# Patient Record
Sex: Male | Born: 1940 | Race: White | Hispanic: No | Marital: Married | Smoking: Former smoker
Health system: Southern US, Community
[De-identification: ages and names within clinical notes are randomized; demographics above are authoritative.]

## PROBLEM LIST (undated history)

## (undated) DIAGNOSIS — H908 Mixed conductive and sensorineural hearing loss, unspecified: Secondary | ICD-10-CM

## (undated) DIAGNOSIS — Z961 Presence of intraocular lens: Secondary | ICD-10-CM

## (undated) DIAGNOSIS — Z7901 Long term (current) use of anticoagulants: Secondary | ICD-10-CM

## (undated) DIAGNOSIS — I495 Sick sinus syndrome: Secondary | ICD-10-CM

## (undated) DIAGNOSIS — I2510 Atherosclerotic heart disease of native coronary artery without angina pectoris: Secondary | ICD-10-CM

## (undated) DIAGNOSIS — I639 Cerebral infarction, unspecified: Secondary | ICD-10-CM

## (undated) DIAGNOSIS — E119 Type 2 diabetes mellitus without complications: Secondary | ICD-10-CM

## (undated) DIAGNOSIS — C189 Malignant neoplasm of colon, unspecified: Secondary | ICD-10-CM

## (undated) DIAGNOSIS — Z950 Presence of cardiac pacemaker: Secondary | ICD-10-CM

## (undated) DIAGNOSIS — M1990 Unspecified osteoarthritis, unspecified site: Secondary | ICD-10-CM

## (undated) DIAGNOSIS — I509 Heart failure, unspecified: Secondary | ICD-10-CM

## (undated) DIAGNOSIS — I219 Acute myocardial infarction, unspecified: Secondary | ICD-10-CM

## (undated) DIAGNOSIS — I10 Essential (primary) hypertension: Secondary | ICD-10-CM

## (undated) DIAGNOSIS — N189 Chronic kidney disease, unspecified: Secondary | ICD-10-CM

## (undated) DIAGNOSIS — J449 Chronic obstructive pulmonary disease, unspecified: Secondary | ICD-10-CM

## (undated) DIAGNOSIS — E039 Hypothyroidism, unspecified: Secondary | ICD-10-CM

## (undated) DIAGNOSIS — I499 Cardiac arrhythmia, unspecified: Secondary | ICD-10-CM

## (undated) DIAGNOSIS — K219 Gastro-esophageal reflux disease without esophagitis: Secondary | ICD-10-CM

## (undated) HISTORY — DX: Acute myocardial infarction, unspecified: I21.9

## (undated) HISTORY — PX: CORONARY ANGIOPLASTY: SHX604

## (undated) HISTORY — DX: Type 2 diabetes mellitus without complications: E11.9

## (undated) HISTORY — DX: Chronic kidney disease, unspecified: N18.9

## (undated) HISTORY — DX: Chronic obstructive pulmonary disease, unspecified: J44.9

## (undated) HISTORY — PX: OTHER SURGICAL HISTORY: SHX169

## (undated) HISTORY — DX: Gastro-esophageal reflux disease without esophagitis: K21.9

## (undated) HISTORY — DX: Cerebral infarction, unspecified: I63.9

## (undated) HISTORY — PX: COLON SURGERY: SHX602

---

## 2001-09-27 DIAGNOSIS — C189 Malignant neoplasm of colon, unspecified: Secondary | ICD-10-CM

## 2001-09-27 HISTORY — DX: Malignant neoplasm of colon, unspecified: C18.9

## 2013-12-04 DIAGNOSIS — I4891 Unspecified atrial fibrillation: Secondary | ICD-10-CM | POA: Insufficient documentation

## 2014-01-15 DIAGNOSIS — G629 Polyneuropathy, unspecified: Secondary | ICD-10-CM | POA: Insufficient documentation

## 2014-01-15 DIAGNOSIS — I10 Essential (primary) hypertension: Secondary | ICD-10-CM | POA: Insufficient documentation

## 2014-01-15 DIAGNOSIS — I2510 Atherosclerotic heart disease of native coronary artery without angina pectoris: Secondary | ICD-10-CM | POA: Insufficient documentation

## 2014-01-15 DIAGNOSIS — G589 Mononeuropathy, unspecified: Secondary | ICD-10-CM | POA: Insufficient documentation

## 2014-03-07 DIAGNOSIS — H908 Mixed conductive and sensorineural hearing loss, unspecified: Secondary | ICD-10-CM | POA: Insufficient documentation

## 2014-03-07 DIAGNOSIS — H663X9 Other chronic suppurative otitis media, unspecified ear: Secondary | ICD-10-CM | POA: Insufficient documentation

## 2014-04-25 DIAGNOSIS — R001 Bradycardia, unspecified: Secondary | ICD-10-CM | POA: Insufficient documentation

## 2014-04-26 DIAGNOSIS — Z950 Presence of cardiac pacemaker: Secondary | ICD-10-CM | POA: Insufficient documentation

## 2014-04-26 DIAGNOSIS — I495 Sick sinus syndrome: Secondary | ICD-10-CM

## 2014-04-26 HISTORY — DX: Sick sinus syndrome: I49.5

## 2014-06-17 ENCOUNTER — Ambulatory Visit: Payer: Self-pay | Admitting: Ophthalmology

## 2014-07-08 ENCOUNTER — Ambulatory Visit: Payer: Self-pay | Admitting: Ophthalmology

## 2014-10-08 ENCOUNTER — Ambulatory Visit: Payer: Self-pay | Admitting: Family Medicine

## 2014-10-09 DIAGNOSIS — N3289 Other specified disorders of bladder: Secondary | ICD-10-CM | POA: Insufficient documentation

## 2014-10-09 DIAGNOSIS — N179 Acute kidney failure, unspecified: Secondary | ICD-10-CM | POA: Insufficient documentation

## 2014-11-20 DIAGNOSIS — N200 Calculus of kidney: Secondary | ICD-10-CM | POA: Insufficient documentation

## 2014-12-09 DIAGNOSIS — Z7901 Long term (current) use of anticoagulants: Secondary | ICD-10-CM | POA: Insufficient documentation

## 2015-02-10 DIAGNOSIS — I513 Intracardiac thrombosis, not elsewhere classified: Secondary | ICD-10-CM | POA: Insufficient documentation

## 2015-03-13 DIAGNOSIS — J449 Chronic obstructive pulmonary disease, unspecified: Secondary | ICD-10-CM | POA: Insufficient documentation

## 2015-07-23 DIAGNOSIS — I639 Cerebral infarction, unspecified: Secondary | ICD-10-CM | POA: Insufficient documentation

## 2015-09-02 ENCOUNTER — Ambulatory Visit: Payer: Medicare Other | Attending: Rehabilitation | Admitting: Occupational Therapy

## 2015-09-02 ENCOUNTER — Encounter: Payer: Self-pay | Admitting: Occupational Therapy

## 2015-09-02 DIAGNOSIS — M6281 Muscle weakness (generalized): Secondary | ICD-10-CM

## 2015-09-02 DIAGNOSIS — R2681 Unsteadiness on feet: Secondary | ICD-10-CM | POA: Diagnosis present

## 2015-09-02 DIAGNOSIS — R262 Difficulty in walking, not elsewhere classified: Secondary | ICD-10-CM | POA: Insufficient documentation

## 2015-09-02 DIAGNOSIS — R531 Weakness: Secondary | ICD-10-CM | POA: Diagnosis present

## 2015-09-02 NOTE — Therapy (Signed)
[place] MAIN [facility] SERVICES [address] [place], [place], [address] Phone: [phone]   Fax:  [phone]  Occupational Therapy Evaluation  Patient Details  Name: Michael Bush MRN: [id number] Date of Birth: 05/22/1941 No Data Recorded  Encounter Date: 09/02/2015      OT End of Session - 09/18/15 1449    Visit Number 1   Number of Visits 24   Date for OT Re-Evaluation 11/25/15   Equipment Utilized During Treatment stroke test kit   Behavior During Therapy [facility] for tasks assessed/performed      Past Medical History  Diagnosis Date  . COPD (chronic obstructive pulmonary disease) ([place])   . Diabetes mellitus without complication ([place])   . GERD (gastroesophageal reflux disease)   . Chronic kidney disease   . Myocardial infarction ([place])   . Stroke ([place])   . Cancer [facility])     Past Surgical History  Procedure Laterality Date  . Colon surgery      There were no vitals filed for this visit.  Visit Diagnosis:  Muscle weakness of left arm - Plan: Ot plan of care cert/re-cert      Subjective Assessment - 09/18/15 1327    Subjective  Patient noticing some new movements in his arm.   Patient is accompained by: Family member    R UE shoulder flexion 0-150o distal motions WNL Strength is 5/5 through out. Grip 115 lbs. L UE shoulder abduction 0-65o (active) elbow 2-/5  In extension and flexion with flexion to -25o. Forearm supination is 1/5 pronation is 2-/5. Minimal hand flexion and no extension. Sensation for light touch, sharp and temp are intact bilaterally.  Patient needs assist for dressing, bathing, cutting food and fasteners including tying shoes. His left upper extremity is very limited.        [facility] OT Assessment - 09/18/15 0001    Precautions   Precautions [place] expects to be discharged to: Private residence   Living Arrangements Spouse/significant other   Available Help at  Discharge Family   Type of [place] One level   [place] - manual   Prior Function   Level of [place] with basic ADLs   Vocation Retired   ADL   Eating/Feeding Modified independent   Eating/Feeding Patient Percentage --  assist to cut food   Grooming Minimal assistance   Upper Body Bathing Minimal assistance   Lower Body Bathing Moderate assistance   Upper Body Dressing Moderate assistance   Lower Body Dressing Moderate assistance   Toilet Tranfer Minimal assistance   Toileting -  Hygiene Moderate assistance   Mobility   Mobility Status Needs assist   Mobility Status Comments quad cane   Written Expression   Dominant Hand Right                         OT Education - 09/18/15 1448    Education provided Yes   Education Details subluxation   Person(s) Educated Patient   Methods Explanation   Comprehension Verbalized understanding             OT Long Term Goals - 09/18/15 1501    OT [place] #1   Title Patient will be able to dress himself with help with fasteners.   Baseline Needs moderate assist and dependent for buttons   Time 12  Period Weeks   Status New   OT LONG TERM GOAL #2   Title Will be able to complete bathing with supervision    Baseline needs assist to bathe and dry   Time 12   Period Weeks   Status New   OT LONG TERM GOAL #3   Title Patient will be able to cut food   Baseline unable   Time 12   Period Weeks   Status New   OT LONG TERM GOAL #4   Title Will be able to tie shoe (one handed)   Baseline 12   Time 12   Period Weeks   Status New   OT LONG TERM GOAL #5   Title Will increase L UE strength to at least 3+/5 to aid in ADL goals.   Baseline Minimal use of L UE   Time 12   Period Weeks   Status New               Plan - 09/18/15 1501    Clinical Impression Statement This patient is a 74 year old male who came to  [facility] after suffering a stroke October 2016.  He lives in a one story home with his wife. He is retired. He had been independent with ADL and functional mobility without assistive devices. He now requires assist for his BADL and Deficits include dressing, bathing, hygiene and UE deficits.  He would benefit from OT for ADL, functional mobility training and upper extremity restoration including strength and coordination.   Pt will benefit from skilled therapeutic intervention in order to improve on the following deficits (Retired) Decreased activity tolerance;Decreased balance;Decreased coordination;Decreased endurance;Decreased knowledge of use of DME;Decreased mobility;Decreased range of motion;Decreased strength;Difficulty walking;Impaired tone;Impaired UE functional use   Rehab Potential Good   Clinical Impairments Affecting Rehab Potential severe L UE deficits.   OT Frequency 2x / week   OT Duration 12 weeks   OT Treatment/Interventions Self-care/ADL training;Electrical Stimulation;Neuromuscular education;Therapeutic exercise;DME and/or AE instruction;Manual Therapy;Therapeutic activities;Therapeutic exercises;Passive range of motion   Consulted and Agree with Plan of Care Patient;Family member/caregiver   Family Member Consulted wife        Problem List There are no active problems to display for this patient.  Sharon Mt, MS/OTR/L Sharon Mt 09/18/2015, 3:03 PM  [place] MAIN [facility] SERVICES [address] [place], [place], [address] Phone: [phone]   Fax:  [phone]  Name: Michael Bush MRN: [id number] Date of Birth: 11-14-1940

## 2015-09-02 NOTE — Patient Instructions (Signed)
Instructed regarding wearing sling.

## 2015-09-04 ENCOUNTER — Ambulatory Visit: Payer: Medicare Other | Admitting: Occupational Therapy

## 2015-09-04 ENCOUNTER — Encounter: Payer: Self-pay | Admitting: Occupational Therapy

## 2015-09-04 DIAGNOSIS — M6281 Muscle weakness (generalized): Secondary | ICD-10-CM | POA: Diagnosis not present

## 2015-09-04 NOTE — Therapy (Signed)
[place] MAIN [facility] SERVICES [address] [place], [place], [address] Phone: [phone]   Fax:  [phone]  Occupational Therapy Treatment  Patient Details  Name: Michael Bush MRN: [id number] Date of Birth: June 20, 1941 No Data Recorded  Encounter Date: 09/04/2015      OT End of Session - 09/04/15 1449    Visit Number 2   Number of Visits 24   Date for OT Re-Evaluation 11/25/15   OT Start Time 1345   OT Stop Time 1430   OT Time Calculation (min) 45 min   Behavior During Therapy [facility] for tasks assessed/performed      Past Medical History  Diagnosis Date  . COPD (chronic obstructive pulmonary disease) ([place])   . Diabetes mellitus without complication ([place])   . GERD (gastroesophageal reflux disease)   . Chronic kidney disease   . Myocardial infarction ([place])   . Stroke ([place])   . Cancer [facility])     Past Surgical History  Procedure Laterality Date  . Colon surgery      There were no vitals filed for this visit.  Visit Diagnosis:  Muscle weakness of left arm      Subjective Assessment - 09/04/15 1445    Subjective  I have not been able to move my arm.   Patient is accompained by: Family member    In supine stretch L upper extremity shoulder flexion, external and internal rotation elbow flexion and extension, forearm supination and pronation, wrist flexion and extension, and hand flexion and extension. Pain at end range of shoulder flexion and external rotation. Active assistive for shoulder flexion external and internal rotation, eccentric and concentric elbow extension, concentric elbow flexion, forearm supination wrist flexion and extension (wrist extension used tapping) in sitting resisted external rotation to reduce sublux. Cues for proper positioning and technique. Taught one handed shoe tie, buttoner, zipper hook.                            OT Education - 09/04/15 1448    Education provided Yes   Education Details Purpose of exercises              OT Long Term Goals - 09/02/15 1344    OT LONG TERM GOAL #1   Title Patient will be able to dress himself with help with fasteners.   Baseline Needs moderate assist and dependent for buttons   Time 12   Period Weeks   Status New   OT LONG TERM GOAL #2   Title Will be able to complete bathing with supervision    Baseline needs assist to bathe and dry   Time 12   Period Weeks   Status New   OT LONG TERM GOAL #3   Title Patient will be able to cut food   Baseline unable   Time 12   Period Weeks   Status New   OT LONG TERM GOAL #4   Title Will be able to tie shoe (one handed)   Baseline 12   Time 12   Period Weeks   Status New   OT LONG TERM GOAL #5   Title Will increase L UE strength to at least 3+/5 to aid in ADL goals.   Baseline Minimal use of L UE   Time 12   Period Weeks   Status New               Plan -  09/04/15 1450    Clinical Impression Statement External rotation reduces the sublux   Pt will benefit from skilled therapeutic intervention in order to improve on the following deficits (Retired) Decreased activity tolerance;Decreased balance;Decreased coordination;Decreased endurance;Decreased knowledge of use of DME;Decreased mobility;Decreased range of motion;Decreased strength;Difficulty walking;Impaired tone;Impaired UE functional use   OT Treatment/Interventions Self-care/ADL training;Electrical Stimulation;Neuromuscular education;Therapeutic exercise;DME and/or AE instruction;Manual Therapy;Therapeutic activities;Therapeutic exercises;Passive range of motion        Problem List There are no active problems to display for this patient.  Sharon Mt, MS/OTR/L  Sharon Mt 09/04/2015, 2:52 PM  [place] MAIN [facility] SERVICES [address] [place], [place], [address] Phone: [phone]   Fax:  [phone]  Name: Danarius Merryweather MRN: [id number] Date  of Birth: 01/25/1941

## 2015-09-04 NOTE — Patient Instructions (Signed)
Instructed patient in home exercise program for ROM and sublux

## 2015-09-09 ENCOUNTER — Encounter: Payer: Self-pay | Admitting: Physical Therapy

## 2015-09-09 ENCOUNTER — Ambulatory Visit: Payer: Medicare Other | Admitting: Occupational Therapy

## 2015-09-09 ENCOUNTER — Ambulatory Visit: Payer: Medicare Other | Admitting: Physical Therapy

## 2015-09-09 DIAGNOSIS — M6281 Muscle weakness (generalized): Secondary | ICD-10-CM | POA: Diagnosis not present

## 2015-09-09 DIAGNOSIS — R262 Difficulty in walking, not elsewhere classified: Secondary | ICD-10-CM

## 2015-09-09 DIAGNOSIS — R531 Weakness: Secondary | ICD-10-CM

## 2015-09-09 DIAGNOSIS — R2681 Unsteadiness on feet: Secondary | ICD-10-CM

## 2015-09-09 NOTE — Therapy (Signed)
[place] MAIN [facility] SERVICES [address] [place], [place], [address] Phone: [phone]   Fax:  [phone]  Occupational Therapy Treatment  Patient Details  Name: Michael Bush MRN: [id number] Date of Birth: 04/29/1941 No Data Recorded  Encounter Date: 09/09/2015  Late Entry      OT End of Session - 09/18/15 1327    Visit Number 3   Number of Visits 24   Date for OT Re-Evaluation 11/25/15   Behavior During Therapy [facility] for tasks assessed/performed      Past Medical History  Diagnosis Date  . COPD (chronic obstructive pulmonary disease) ([place])   . Diabetes mellitus without complication ([place])   . GERD (gastroesophageal reflux disease)   . Chronic kidney disease   . Myocardial infarction ([place])   . Stroke ([place])   . Cancer [facility])     Past Surgical History  Procedure Laterality Date  . Colon surgery      There were no vitals filed for this visit.  Visit Diagnosis:  Muscle weakness of left arm      Subjective Assessment - 09/18/15 1327    Subjective  Patient noticing some new movements in his arm.   Patient is accompained by: Family member     Supine, stretch R upper extremity in shoulder flexion, external and internal rotation elbow flexion and extension, forearm supination and pronation, wrist flexion and extension, and hand flexion and extension. L upper extremity active assistive exercises shoulder flexion, external and internal rotation elbow flexion and extension, forearm supination and pronation, wrist  Extension used quick stretch and tapping for supination and wrist extension. Shoulder retraction, isometric resistive external rotation to inhibit sublux.  Weight bearing and Patient stretched into reflex inhibiting pattern through out session to aid in normalizing tone. Eccentric and concentric elbow extension.                          OT Education - 09/18/15 1332    Education provided Yes   Education Details Instructed in theory of why his arm does what it does.   Person(s) Educated Patient   Methods Explanation;Demonstration;Verbal cues   Comprehension Returned demonstration;Verbalized understanding             OT Long Term Goals - 09/02/15 1344    OT LONG TERM GOAL #1   Title Patient will be able to dress himself with help with fasteners.   Baseline Needs moderate assist and dependent for buttons   Time 12   Period Weeks   Status New   OT LONG TERM GOAL #2   Title Will be able to complete bathing with supervision    Baseline needs assist to bathe and dry   Time 12   Period Weeks   Status New   OT LONG TERM GOAL #3   Title Patient will be able to cut food   Baseline unable   Time 12   Period Weeks   Status New   OT LONG TERM GOAL #4   Title Will be able to tie shoe (one handed)   Baseline 12   Time 12   Period Weeks   Status New   OT LONG TERM GOAL #5   Title Will increase L UE strength to at least 3+/5 to aid in ADL goals.   Baseline Minimal use of L UE   Time 12   Period Weeks   Status New  Plan - 09/18/15 1327    Clinical Impression Statement Able to stimulate weak supination today, to neutral position  working twords being able to hole a cup or position hand for opening door knob. Patient required facilitation technique of tapping at forearm to demonstrate supination. Patient would continue to benefit from skilled OT to improve independence in necessary daily tasks.    Pt will benefit from skilled therapeutic intervention in order to improve on the following deficits (Retired) Decreased activity tolerance;Decreased balance;Decreased coordination;Decreased endurance;Decreased knowledge of use of DME;Decreased mobility;Decreased range of motion;Decreased strength;Difficulty walking;Impaired tone;Impaired UE functional use   Rehab Potential Good   OT Duration 12 weeks   OT Treatment/Interventions Self-care/ADL training;Electrical  Stimulation;Neuromuscular education;Therapeutic exercise;DME and/or AE instruction;Manual Therapy;Therapeutic activities;Therapeutic exercises;Passive range of motion   Consulted and Agree with Plan of Care Patient;Family member/caregiver        Problem List There are no active problems to display for this patient.  Sharon Mt, MS/OTR/L  Sharon Mt 09/18/2015, 1:34 PM  [place] MAIN [facility] SERVICES [address] [place], [place], [address] Phone: [phone]   Fax:  [phone]  Name: Michael Bush MRN: [id number] Date of Birth: Apr 27, 1941

## 2015-09-09 NOTE — Therapy (Signed)
[place] MAIN [facility] SERVICES [place], [place], [address] Phone: [phone]   Fax:  [phone]  Physical Therapy Evaluation  Patient Details  Name: Michael Bush MRN: [id number] Date of Birth: 1940/11/26 Referring Provider: Elmyra Ricks Date: 09/09/2015    Past Medical History  Diagnosis Date  . COPD (chronic obstructive pulmonary disease) ([place])   . Diabetes mellitus without complication ([place])   . GERD (gastroesophageal reflux disease)   . Chronic kidney disease   . Myocardial infarction ([place])   . Stroke ([place])   . Cancer [facility])     Past Surgical History  Procedure Laterality Date  . Colon surgery      There were no vitals filed for this visit.  Visit Diagnosis:  Difficulty walking  Unsteady gait  Weakness    PAIN: No reports of pain  POSTURE: WFL   PROM/AROM: Flaccid LUE   STRENGTH:  Graded on a 0-5 scale Muscle Group Left Right  Shoulder flex    Shoulder Abd    Shoulder Ext    Shoulder IR/ER    Elbow    Wrist/hand    Hip Flex 3+ 4  Hip Abd 3+ 4  Hip Add 3 4  Hip Ext 2 4  Hip IR/ER 3+ 4  Knee Flex 4 4+  Knee Ext 4 4+  Ankle DF -4 4  Ankle PF 3+ 4   SENSATION: WFL  Coordination: impaired LLE    FUNCTIONAL MOBILITY: slow supine to sit and sit to supine   BALANCE: unable to tandem stand, single leg stand or turn head with feet together   GAIT: Ambulates with quad cane 200 feet with left toe catching intermediately  OUTCOME MEASURES: TEST Outcome Interpretation  5 times sit<>stand 17.25sec >47 yo, >15 sec indicates increased risk for falls  10 meter walk test     . 54            m/s <1.0 m/s indicates increased risk for falls; limited community ambulator  Timed up and Go   26.52              sec <14 sec indicates increased risk for falls  6 minute walk test    395            Feet 1000 feet is community Water quality scientist  <36/56 (100% risk for  falls), 37-45 (80% risk for falls); 46-51 (>50% risk for falls); 52-55 (lower risk <25% of falls)  [address] Peg Test LFlorentina Addison PT Assessment - 09/09/15 0001    Assessment   Medical Diagnosis CVA   Referring Provider LIPSCOMB-HUDSON,   Onset Date/Surgical Date 07/16/15   Hand Dominance Right   Next MD Visit 10/12/15   Prior Therapy hospital, HHPT   Precautions   Precautions Fall   Balance Screen   Has the patient fallen in the past 6 months Yes   How many times? 2   Has the patient had a decrease in activity level because of a fear of falling?  Yes   Is the patient reluctant to leave their home because of a fear of falling?  No   Home Environment   Living Environment Private residence   Living Arrangements Spouse/significant other   Available Help at Discharge Family   Type of Home  House   Home Access Stairs to enter   Entrance Stairs-Number of Steps 1   Entrance Stairs-Rails Right   Home Layout One level   Home Equipment Wheelchair - manual;Cane - quad;Other (comment)  hemi walkier   Prior Function   Level of Independence Independent                           PT Education - 2015/09/23 1505    Education provided Yes   Education Details plan of care   Person(s) Educated Patient   Methods Explanation   Comprehension Verbalized understanding             PT Long Term Goals - 2015-09-23 1506    PT LONG TERM GOAL #1   Title Patient will reduce timed up and go to <11 seconds to reduce fall risk and demonstrate improved transfer/gait ability.   Time 12   Period Weeks   PT LONG TERM GOAL #2   Title Patient will increase six minute walk test distance to >1000 for progression to community ambulator and improve gait ability   Time 12   Period Weeks   PT LONG TERM GOAL #3   Title Patient will increase 10 meter walk test to >1.41m/s as to improve gait speed for better community ambulation and to reduce fall risk.   Time 12    Period Weeks   PT LONG TERM GOAL #4   Title Patient will tolerate 5 seconds of single leg stance without loss of balance to improve ability to get in and out of shower safely.   Time 12   Period Weeks               Plan - 09-23-2015 1556    Clinical Impression Statement Patient is 74 yr old male with recent CVA october 2016. He has had 2 falls and has unsteady gait, decreased dynamic and static standing balance and mobiity dificits.    Pt will benefit from skilled therapeutic intervention in order to improve on the following deficits Abnormal gait;Decreased balance;Decreased mobility;Difficulty walking;Impaired tone;Decreased activity tolerance;Decreased strength;Impaired UE functional use;Decreased safety awareness   Rehab Potential Good   PT Frequency 2x / week   PT Duration 12 weeks   PT Treatment/Interventions Neuromuscular re-education;Balance training;Therapeutic exercise;Therapeutic activities;Stair training;Gait training;Functional mobility training   PT Next Visit Plan Balance training and strengthening   PT Home Exercise Plan sit to stand, squats, heel raises   Consulted and Agree with Plan of Care Patient          G-Codes - Sep 23, 2015 1555    Mobility: Walking and Moving Around Current Status [phone]) At least 40 percent but less than 60 percent impaired, limited or restricted   Mobility: Walking and Moving Around Goal Status [id number]) At least 20 percent but less than 40 percent impaired, limited or restricted       Problem List There are no active problems to display for this patient.   Alanson Puls 09/23/15, 4:02 PM  [place] MAIN [facility] SERVICES [address] [place], [place], [address] Phone: [phone]   Fax:  [phone]  Name: Michael Bush MRN: [id number] Date of Birth: 04/10/1941

## 2015-09-09 NOTE — Patient Instructions (Signed)
Instructed in additional home program

## 2015-09-11 ENCOUNTER — Encounter: Payer: Self-pay | Admitting: Occupational Therapy

## 2015-09-11 ENCOUNTER — Ambulatory Visit: Payer: Medicare Other | Admitting: Occupational Therapy

## 2015-09-11 DIAGNOSIS — M6281 Muscle weakness (generalized): Secondary | ICD-10-CM | POA: Diagnosis not present

## 2015-09-11 NOTE — Therapy (Signed)
[place] MAIN [facility] SERVICES [address] [place], [place], [address] Phone: [phone]   Fax:  [phone]  Occupational Therapy Treatment  Patient Details  Name: Michael Bush MRN: [id number] Date of Birth: 05-07-1941 No Data Recorded  Encounter Date: 09/11/2015      OT End of Session - 09/11/15 1456    Visit Number 4   Number of Visits 24   Date for OT Re-Evaluation 11/25/15   OT Start Time 1345   OT Stop Time 1430   OT Time Calculation (min) 45 min      Past Medical History  Diagnosis Date  . COPD (chronic obstructive pulmonary disease) ([place])   . Diabetes mellitus without complication ([place])   . GERD (gastroesophageal reflux disease)   . Chronic kidney disease   . Myocardial infarction ([place])   . Stroke ([place])   . Cancer [facility])     Past Surgical History  Procedure Laterality Date  . Colon surgery      There were no vitals filed for this visit.  Visit Diagnosis:  Muscle weakness of left arm      Subjective Assessment - 09/11/15 1452    Subjective  I could not do that at home (forearm supination)   Patient is accompained by: Family member    Patient stretched into reflex inhibiting pattern through out session to aid in normalizing tone. Weight bearing through out to aid in normalizing tone. Stretching L upper extremity into shoulder flexion, external and internal rotation elbow flexion and extension, forearm supination and pronation, wrist flexion and extension, and hand flexion and extension.  Repeated same motions with active assistive motions with varying assistance. Today with very minimal finger 2 and 3 extension.                               OT Long Term Goals - 09/02/15 1344    OT LONG TERM GOAL #1   Title Patient will be able to dress himself with help with fasteners.   Baseline Needs moderate assist and dependent for buttons   Time 12   Period Weeks   Status New   OT LONG  TERM GOAL #2   Title Will be able to complete bathing with supervision    Baseline needs assist to bathe and dry   Time 12   Period Weeks   Status New   OT LONG TERM GOAL #3   Title Patient will be able to cut food   Baseline unable   Time 12   Period Weeks   Status New   OT LONG TERM GOAL #4   Title Will be able to tie shoe (one handed)   Baseline 12   Time 12   Period Weeks   Status New   OT LONG TERM GOAL #5   Title Will increase L UE strength to at least 3+/5 to aid in ADL goals.   Baseline Minimal use of L UE   Time 12   Period Weeks   Status New               Plan - 09/11/15 1457    Clinical Impression Statement Small gains in movement continue to be evident.   Pt will benefit from skilled therapeutic intervention in order to improve on the following deficits (Retired) Decreased activity tolerance;Decreased balance;Decreased coordination;Decreased endurance;Decreased knowledge of use of DME;Decreased mobility;Decreased range of motion;Decreased strength;Difficulty walking;Impaired tone;Impaired UE  functional use   OT Treatment/Interventions Self-care/ADL training;Electrical Stimulation;Neuromuscular education;Therapeutic exercise;DME and/or AE instruction;Manual Therapy;Therapeutic activities;Therapeutic exercises;Passive range of motion        Problem List There are no active problems to display for this patient. Sharon Mt, MS/OTR/L  Sharon Mt 09/11/2015, 3:01 PM  [place] MAIN [facility] SERVICES [address] [place], [place], [address] Phone: [phone]   Fax:  [phone]  Name: Michael Bush MRN: [id number] Date of Birth: [birth year]

## 2015-09-15 ENCOUNTER — Ambulatory Visit: Payer: Medicare Other | Admitting: Occupational Therapy

## 2015-09-15 ENCOUNTER — Ambulatory Visit: Payer: Medicare Other

## 2015-09-15 DIAGNOSIS — R2681 Unsteadiness on feet: Secondary | ICD-10-CM

## 2015-09-15 DIAGNOSIS — M6281 Muscle weakness (generalized): Secondary | ICD-10-CM | POA: Diagnosis not present

## 2015-09-15 DIAGNOSIS — R531 Weakness: Secondary | ICD-10-CM

## 2015-09-15 NOTE — Therapy (Signed)
[place] MAIN [facility] SERVICES [address] [place], [place], [address] Phone: [phone]   Fax:  [phone]  Physical Therapy Treatment  Patient Details  Name: Michael Bush MRN: [id number] Date of Birth: 1941/06/27 Referring Provider: Elmyra Ricks Date: 09/15/2015      PT End of Session - 09/15/15 1647    Visit Number 2   Number of Visits 25   Date for PT Re-Evaluation 10/06/15   Authorization Type 2/10   PT Start Time [id number]   PT Stop Time 1630   PT Time Calculation (min) 45 min   Equipment Utilized During Treatment Gait belt   Activity Tolerance Patient limited by fatigue   Behavior During Therapy [facility] for tasks assessed/performed      Past Medical History  Diagnosis Date  . COPD (chronic obstructive pulmonary disease) ([place])   . Diabetes mellitus without complication ([place])   . GERD (gastroesophageal reflux disease)   . Chronic kidney disease   . Myocardial infarction ([place])   . Stroke ([place])   . Cancer [facility])     Past Surgical History  Procedure Laterality Date  . Colon surgery      There were no vitals filed for this visit.  Visit Diagnosis:  Unsteady gait  Weakness      Subjective Assessment - 09/15/15 1642    Subjective pt reports some days he can walk pretty good without the cane even. today he feels more shakey.   Currently in Pain? Yes   Pain Score 3    Pain Location Back   Pain Type Chronic pain        therex: Nustep : 3 LE only x 4 min on charge  Standing mini squat 2x10; standing ankle DF/PF 2x10; standing hip abd 2x10; standing march 2x10; ; standing hamstring curl 2x10 Fwd/retro walking no ue in // bars  pt requires CGA for safety on balance exercises  X 5 laps Standing on AIREX normal BOS 2 x 1 min, then PT induced perturbations 2x1 min then wt shift x 1 min Pt requires min verbal and tactile cues for proper exercise performance                                   PT Long Term Goals - 09/09/15 1506    PT LONG TERM GOAL #1   Title Patient will reduce timed up and go to <11 seconds to reduce fall risk and demonstrate improved transfer/gait ability.   Time 12   Period Weeks   PT LONG TERM GOAL #2   Title Patient will increase six minute walk test distance to >1000 for progression to community ambulator and improve gait ability   Time 12   Period Weeks   PT LONG TERM GOAL #3   Title Patient will increase 10 meter walk test to >1.31m/s as to improve gait speed for better community ambulation and to reduce fall risk.   Time 12   Period Weeks   PT LONG TERM GOAL #4   Title Patient will tolerate 5 seconds of single leg stance without loss of balance to improve ability to get in and out of shower safely.   Time 12   Period Weeks               Plan - 09/15/15 1648    Clinical Impression Statement pt did well with initiation of progressed HEP for home  strengthening. pt was fatigued with exercises today with some increased back pain. wife was present for session and supportive. some rigidity and dyskinesia noted on LLE.    Pt will benefit from skilled therapeutic intervention in order to improve on the following deficits Abnormal gait;Decreased balance;Decreased mobility;Difficulty walking;Impaired tone;Decreased activity tolerance;Decreased strength;Impaired UE functional use;Decreased safety awareness   Rehab Potential Good   PT Frequency 2x / week   PT Duration 12 weeks   PT Treatment/Interventions Neuromuscular re-education;Balance training;Therapeutic exercise;Therapeutic activities;Stair training;Gait training;Functional mobility training   PT Next Visit Plan Balance training and strengthening   PT Home Exercise Plan sit to stand, squats, heel raises   Consulted and Agree with Plan of Care Patient        Problem List There are no active problems to display for this patient.  Michael Bush, PT, DPT [phone]  Michael Bush 09/15/2015, 4:54 PM  [place] MAIN [facility] SERVICES [address] [place], [place], [address] Phone: [phone]   Fax:  [phone]  Name: Michael Bush MRN: [id number] Date of Birth: 10/08/1940

## 2015-09-15 NOTE — Patient Instructions (Signed)
[email or web address] Standing mini squat 2x10; standing ankle DF/PF 2x10; standing hip abd 2x10; standing march 2x10;  standing hamstring curl 2x10

## 2015-09-15 NOTE — Patient Instructions (Signed)
Instructed patient in positioning for supine to sit, however, patient has a painful right (non affected) shoulder and that technique "will not work for him right now"

## 2015-09-15 NOTE — Therapy (Signed)
[place] MAIN [facility] SERVICES [address] [place], [place], [address] Phone: [phone]   Fax:  [phone]  Occupational Therapy Treatment  Patient Details  Name: Michael Bush MRN: [id number] Date of Birth: 1941-04-01 No Data Recorded  Encounter Date: 09/15/2015      OT End of Session - 09/15/15 1754    Visit Number 5   Number of Visits 24   Date for OT Re-Evaluation 11/25/15   OT Start Time 1500   OT Stop Time 1545   OT Time Calculation (min) 45 min      Past Medical History  Diagnosis Date  . COPD (chronic obstructive pulmonary disease) ([place])   . Diabetes mellitus without complication ([place])   . GERD (gastroesophageal reflux disease)   . Chronic kidney disease   . Myocardial infarction ([place])   . Stroke ([place])   . Cancer [facility])     Past Surgical History  Procedure Laterality Date  . Colon surgery      There were no vitals filed for this visit.  Visit Diagnosis:  Muscle weakness of left arm      Subjective Assessment - 09/15/15 1751    Subjective  I tried and tried to make that wrist come up at home.   Patient is accompained by: Family member      Completed weight bearing through out session to normalize tone.  Patient stretched into reflex inhibiting pattern through out session to aid in normalizing tone. In supine, Stretching in left upper extremity  shoulder flexion, external and internal rotation elbow flexion and extension, forearm supination and pronation, wrist flexion and extension, and hand flexion and extension.  Then active assistive range in the same planes. Worked extensively on stretching fingers into extension, then working on active extension which is still very minimal. Tapping and quick stretch were utilized during wrist and forearm movements. Cues were needed for postioning and technique.                         OT Education - 09/15/15 1753    Education provided Yes   Education Details Instructed patient that the problem is the signals comming from the brain as to why his arm does not work   [employer]) Educated Patient;Spouse   Methods Explanation   Comprehension Verbalized understanding             OT Long Term Goals - 09/02/15 1344    OT LONG TERM GOAL #1   Title Patient will be able to dress himself with help with fasteners.   Baseline Needs moderate assist and dependent for buttons   Time 12   Period Weeks   Status New   OT LONG TERM GOAL #2   Title Will be able to complete bathing with supervision    Baseline needs assist to bathe and dry   Time 12   Period Weeks   Status New   OT LONG TERM GOAL #3   Title Patient will be able to cut food   Baseline unable   Time 12   Period Weeks   Status New   OT LONG TERM GOAL #4   Title Will be able to tie shoe (one handed)   Baseline 12   Time 12   Period Weeks   Status New   OT LONG TERM GOAL #5   Title Will increase L UE strength to at least 3+/5 to aid in ADL goals.  Baseline Minimal use of L UE   Time 12   Period Weeks   Status New               Plan - 09/15/15 1757    Clinical Impression Statement Movement improving in active motion in shoulder flexion/extension/external rotation, elbow movements. with stimulation such as tapping and quick stretch, shows some wrist extension and forearm supination. Patient can close hand somewhat, but very minimal finger 2 and 3 extension wiith agressive intervension.   Pt will benefit from skilled therapeutic intervention in order to improve on the following deficits (Retired) Decreased activity tolerance;Decreased balance;Decreased coordination;Decreased endurance;Decreased knowledge of use of DME;Decreased mobility;Decreased range of motion;Decreased strength;Difficulty walking;Impaired tone;Impaired UE functional use   OT Treatment/Interventions Self-care/ADL training;Electrical Stimulation;Neuromuscular education;Therapeutic exercise;DME  and/or AE instruction;Manual Therapy;Therapeutic activities;Therapeutic exercises;Passive range of motion        Problem List There are no active problems to display for this patient.   Sharon Mt 09/15/2015, 6:03 PM  [place] MAIN [facility] SERVICES [address] [place], [place], [address] Phone: [phone]   Fax:  [phone]  Name: Michael Bush MRN: [id number] Date of Birth: 07/13/1941

## 2015-09-17 ENCOUNTER — Encounter: Payer: Self-pay | Admitting: Occupational Therapy

## 2015-09-17 ENCOUNTER — Ambulatory Visit: Payer: Medicare Other | Admitting: Occupational Therapy

## 2015-09-17 ENCOUNTER — Ambulatory Visit: Payer: Medicare Other

## 2015-09-17 DIAGNOSIS — R531 Weakness: Secondary | ICD-10-CM

## 2015-09-17 DIAGNOSIS — R2681 Unsteadiness on feet: Secondary | ICD-10-CM

## 2015-09-17 DIAGNOSIS — M6281 Muscle weakness (generalized): Secondary | ICD-10-CM | POA: Diagnosis not present

## 2015-09-17 NOTE — Therapy (Signed)
[place] MAIN [facility] SERVICES [address] [place], [place], [address] Phone: [phone]   Fax:  [phone]  Occupational Therapy Treatment  Patient Details  Name: Michael Bush MRN: [id number] Date of Birth: 1940-11-01 No Data Recorded  Encounter Date: 09/17/2015      OT End of Session - 09/17/15 1800    Visit Number 6   Number of Visits 24   Date for OT Re-Evaluation 11/25/15      Past Medical History  Diagnosis Date  . COPD (chronic obstructive pulmonary disease) ([place])   . Diabetes mellitus without complication ([place])   . GERD (gastroesophageal reflux disease)   . Chronic kidney disease   . Myocardial infarction ([place])   . Stroke ([place])   . Cancer [facility])     Past Surgical History  Procedure Laterality Date  . Colon surgery      There were no vitals filed for this visit.  Visit Diagnosis:  Muscle weakness of left arm      Subjective Assessment - 09/17/15 1758    Subjective  I can unbutton my buttons, but can not button.   Patient is accompained by: Family member    Completed weight bearing through out session to normalize tone. Patient stretched into reflex inhibiting pattern through out session to aid in normalizing tone. In supine, Stretching in left upper extremity shoulder flexion, external and internal rotation elbow flexion and extension, forearm supination and pronation, wrist flexion and extension, and hand flexion and extension.Repeated elbow extension eccentrically  Then active assistive range in the same planes.Tapping and quick stretch were utilized during wrist and forearm movements. Cues were needed for postioning and technique. Practiced buttoning and unbuttoning buttons with buttoner. Verbal and physical cues needed for this activity. After a few minutes patient succeeded                                OT Long Term Goals - 09/02/15 1344    OT [place] #1   Title  Patient will be able to dress himself with help with fasteners.   Baseline Needs moderate assist and dependent for buttons   Time 12   Period Weeks   Status New   OT LONG TERM GOAL #2   Title Will be able to complete bathing with supervision    Baseline needs assist to bathe and dry   Time 12   Period Weeks   Status New   OT LONG TERM GOAL #3   Title Patient will be able to cut food   Baseline unable   Time 12   Period Weeks   Status New   OT LONG TERM GOAL #4   Title Will be able to tie shoe (one handed)   Baseline 12   Time 12   Period Weeks   Status New   OT LONG TERM GOAL #5   Title Will increase L UE strength to at least 3+/5 to aid in ADL goals.   Baseline Minimal use of L UE   Time 12   Period Weeks   Status New               Plan - 09/17/15 1801    Clinical Impression Statement Continues to gain small movement in left upper extremity, some needing tapping and quick stretch.   Pt will benefit from skilled therapeutic intervention in order to improve on the following deficits (  Retired) Decreased activity tolerance;Decreased balance;Decreased coordination;Decreased endurance;Decreased knowledge of use of DME;Decreased mobility;Decreased range of motion;Decreased strength;Difficulty walking;Impaired tone;Impaired UE functional use   OT Treatment/Interventions Self-care/ADL training;Electrical Stimulation;Neuromuscular education;Therapeutic exercise;DME and/or AE instruction;Manual Therapy;Therapeutic activities;Therapeutic exercises;Passive range of motion        Problem List There are no active problems to display for this patient.  Sharon Mt, MS/OTR/L  Sharon Mt 09/17/2015, 6:06 PM  [place] MAIN [facility] SERVICES [address] [place], [place], [address] Phone: [phone]   Fax:  [phone]  Name: Pantelis Castrillo MRN: [id number] Date of Birth: 05-May-1941

## 2015-09-17 NOTE — Therapy (Signed)
[place] MAIN [facility] SERVICES [address] [place], [place], [address] Phone: [phone]   Fax:  [phone]  Physical Therapy Treatment  Patient Details  Name: Michael Bush MRN: [id number] Date of Birth: 07/10/1941 Referring Provider: Elmyra Ricks Date: 09/17/2015      PT End of Session - 09/17/15 1658    Visit Number 3   Number of Visits 25   Date for PT Re-Evaluation 10/06/15   Authorization Type 3/10   PT Start Time [id number]   PT Stop Time 1645   PT Time Calculation (min) 60 min   Equipment Utilized During Treatment Gait belt   Activity Tolerance Patient limited by fatigue   Behavior During Therapy [facility] for tasks assessed/performed      Past Medical History  Diagnosis Date  . COPD (chronic obstructive pulmonary disease) ([place])   . Diabetes mellitus without complication ([place])   . GERD (gastroesophageal reflux disease)   . Chronic kidney disease   . Myocardial infarction ([place])   . Stroke ([place])   . Cancer [facility])     Past Surgical History  Procedure Laterality Date  . Colon surgery      There were no vitals filed for this visit.  Visit Diagnosis:  Unsteady gait  Weakness      Subjective Assessment - 09/17/15 1551    Subjective (p) pt reports he is ready to go. he has a little back pain   Currently in Pain? (p) Yes   Pain Score (p) 1    Pain Location (p) Back      Gait training: On TM: 1.8-2.0 mph with handrail 2x5 min. CGA for safety. Pt needed min cues for increased step length L>R, cues for terminal knee extension L during swing and heel toe transfer.  Over ground gait training, same cues as above with SPC, with cues for eyes up and upright posture 4x23ft Fwd/retro walking with SPC same cues as above   NMR: standing on AIRE[employer] reaching outside BOS on [place] x 6 min with narrow BOS Tilt board AP and SS wt shift and static balance 3x1 min each way Side stepping without UE support in // bars x 3  laps  pt requires CGA for safety on balance exercises                             PT Education - 09/17/15 1658    Education provided Yes   Education Details gait training   Person(s) Educated Patient;Spouse   Methods Explanation   Comprehension Verbalized understanding;Returned demonstration             PT Long Term Goals - 09/09/15 1506    PT LONG TERM GOAL #1   Title Patient will reduce timed up and go to <11 seconds to reduce fall risk and demonstrate improved transfer/gait ability.   Time 12   Period Weeks   PT LONG TERM GOAL #2   Title Patient will increase six minute walk test distance to >1000 for progression to community ambulator and improve gait ability   Time 12   Period Weeks   PT LONG TERM GOAL #3   Title Patient will increase 10 meter walk test to >1.58m/s as to improve gait speed for better community ambulation and to reduce fall risk.   Time 12   Period Weeks   PT LONG TERM GOAL #4   Title Patient will tolerate 5 seconds of single  leg stance without loss of balance to improve ability to get in and out of shower safely.   Time 12   Period Weeks               Plan - 09/17/15 1659    Clinical Impression Statement pt did very well with gait training today. he is able to initiate cues into action for improved L step length, improved heel strike and terminal knee extension in stance. he does get a little fatigued after walking about 15oft needing rest. also did well with weight shifting exercises to promote more weight through the LLE   Pt will benefit from skilled therapeutic intervention in order to improve on the following deficits Abnormal gait;Decreased balance;Decreased mobility;Difficulty walking;Impaired tone;Decreased activity tolerance;Decreased strength;Impaired UE functional use;Decreased safety awareness   Rehab Potential Good   PT Frequency 2x / week   PT Duration 12 weeks   PT Treatment/Interventions Neuromuscular  re-education;Balance training;Therapeutic exercise;Therapeutic activities;Stair training;Gait training;Functional mobility training   PT Next Visit Plan Balance training and strengthening   PT Home Exercise Plan sit to stand, squats, heel raises   Consulted and Agree with Plan of Care Patient        Problem List There are no active problems to display for this patient.  Gorden Harms. Tank [place]ore, PT, DPT [phone]  Michael Bush 09/17/2015, 5:02 PM  [place] MAIN [facility] SERVICES [address] [place], [place], [address] Phone: [phone]   Fax:  [phone]  Name: Michael Bush MRN: [id number] Date of Birth: 01-09-1941

## 2015-09-17 NOTE — Patient Instructions (Signed)
Instruction on use of buttoner

## 2015-09-23 ENCOUNTER — Ambulatory Visit: Payer: Medicare Other

## 2015-09-23 ENCOUNTER — Encounter: Payer: Self-pay | Admitting: Occupational Therapy

## 2015-09-23 ENCOUNTER — Ambulatory Visit: Payer: Medicare Other | Admitting: Occupational Therapy

## 2015-09-23 DIAGNOSIS — M6281 Muscle weakness (generalized): Secondary | ICD-10-CM | POA: Diagnosis not present

## 2015-09-23 DIAGNOSIS — R2681 Unsteadiness on feet: Secondary | ICD-10-CM

## 2015-09-23 DIAGNOSIS — R531 Weakness: Secondary | ICD-10-CM

## 2015-09-23 DIAGNOSIS — R262 Difficulty in walking, not elsewhere classified: Secondary | ICD-10-CM

## 2015-09-23 NOTE — Therapy (Signed)
[place] MAIN [facility] SERVICES [address] [place], [place], [address] Phone: [phone]   Fax:  [phone]  Occupational Therapy Treatment  Patient Details  Name: Keithen Woolson MRN: [id number] Date of Birth: 02/01/1941 No Data Recorded  Encounter Date: 09/23/2015      OT End of Session - 09/23/15 1528    Visit Number 7   Number of Visits 24   Date for OT Re-Evaluation 11/25/15   OT Start Time 1430   OT Stop Time 1515   OT Time Calculation (min) 45 min   Behavior During Therapy [facility] for tasks assessed/performed      Past Medical History  Diagnosis Date  . COPD (chronic obstructive pulmonary disease) ([place])   . Diabetes mellitus without complication ([place])   . GERD (gastroesophageal reflux disease)   . Chronic kidney disease   . Myocardial infarction ([place])   . Stroke ([place])   . Cancer [facility])     Past Surgical History  Procedure Laterality Date  . Colon surgery      There were no vitals filed for this visit.  Visit Diagnosis:  Weakness  Muscle weakness of left arm      Subjective Assessment - 09/23/15 1522    Subjective  Complains of left shoulder pain of 5/10 with passive shoulder flexion (at 140o) abduction (at 85o) and external rotation (at 55o).   Patient is accompained by: Family member   Currently in Pain? Yes  See subjective section    In supine Patient stretched into reflex inhibiting pattern through out session to aid in normalizing tone. Weight bearing through out session to aid in normalizing tone. Passive stretch to L upper extremity in shoulder flexion, external and internal rotation elbow flexion and extension, forearm supination and pronation, wrist flexion and extension, and hand flexion and extension. Repeated same motions with active assistive with various need for assist. Added eccentric elbow flexion. In sitting, completed active reflex inhibiting pattern as well as  possible.                          OT Education - 09/23/15 1527    Education provided Yes   Education Details Regarding why vibration might help movement.              OT Long Term Goals - 09/18/15 1501    OT LONG TERM GOAL #1   Title Patient will be able to dress himself with help with fasteners.   Baseline Needs moderate assist and dependent for buttons   Time 12   Period Weeks   Status New   OT LONG TERM GOAL #2   Title Will be able to complete bathing with supervision    Baseline needs assist to bathe and dry   Time 12   Period Weeks   Status New   OT LONG TERM GOAL #3   Title Patient will be able to cut food   Baseline unable   Time 12   Period Weeks   Status New   OT LONG TERM GOAL #4   Title Will be able to tie shoe (one handed)   Baseline 12   Time 12   Period Weeks   Status New   OT LONG TERM GOAL #5   Title Will increase L UE strength to at least 3+/5 to aid in ADL goals.   Baseline Minimal use of L UE   Time 12   Period Weeks  Status New               Plan - 09/23/15 1530    Clinical Impression Statement This patient is a 74 year old male who came to [facility] after suffering a stroke in October 2016. He is demonstrating some pain at more extreme passive motions of L UE. He reports it as stretching pain not a pinching pain. Will beging more agressive stretching treatment.    Pt will benefit from skilled therapeutic intervention in order to improve on the following deficits (Retired) Decreased activity tolerance;Decreased balance;Decreased coordination;Decreased endurance;Decreased knowledge of use of DME;Decreased mobility;Decreased range of motion;Decreased strength;Difficulty walking;Impaired tone;Impaired UE functional use   OT Treatment/Interventions Self-care/ADL training;Electrical Stimulation;Neuromuscular education;Therapeutic exercise;DME and/or AE instruction;Manual Therapy;Therapeutic  activities;Therapeutic exercises;Passive range of motion   Consulted and Agree with Plan of Care Patient;Family member/caregiver   Family Member Consulted wife        Problem List There are no active problems to display for this patient.  Sharon Mt, MS/OTR/L  Sharon Mt 09/23/2015, 3:34 PM  [place] MAIN [facility] SERVICES [address] [place], [place], [address] Phone: [phone]   Fax:  [phone]  Name: Noctis Fien MRN: [id number] Date of Birth: 11-19-1940

## 2015-09-23 NOTE — Patient Instructions (Signed)
Instructed to use the vibrator at home as he reports it helps with movement. Instructed to stay away from pacemaker.

## 2015-09-23 NOTE — Therapy (Signed)
[place] MAIN [facility] SERVICES [address] [place], [place], [address] Phone: [phone]   Fax:  [phone]  Physical Therapy Treatment  Patient Details  Name: Michael Bush MRN: [id number] Date of Birth: 04/17/1941 Referring Provider: Elmyra Ricks Date: 09/23/2015      PT End of Session - 09/23/15 1808    Visit Number 4   Number of Visits 25   Date for PT Re-Evaluation 10/06/15   Authorization Type 4/10   PT Start Time 1515   PT Stop Time 1600   PT Time Calculation (min) 45 min   Equipment Utilized During Treatment Gait belt   Activity Tolerance Patient limited by fatigue   Behavior During Therapy [facility] for tasks assessed/performed      Past Medical History  Diagnosis Date  . COPD (chronic obstructive pulmonary disease) ([place])   . Diabetes mellitus without complication ([place])   . GERD (gastroesophageal reflux disease)   . Chronic kidney disease   . Myocardial infarction ([place])   . Stroke ([place])   . Cancer [facility])     Past Surgical History  Procedure Laterality Date  . Colon surgery      There were no vitals filed for this visit.  Visit Diagnosis:  Weakness  Unsteady gait  Difficulty walking      Subjective Assessment - 09/23/15 1807    Subjective pt reports he is tired of "doing nothing", but otherwise ok.    Currently in Pain? No/denies      Therex: On AIREX - wide BOS with L/R and up / down head turns 3x10 each no UE Fwd step up onto 4inch step= light UE support 2x5 each leg: needs min A at times Squats on AIREX with yellow band around knees 2x10 cues for hip ER Fwd and retro step/ and back no ue x 10 each leg  pt requires CGA for safety on balance exercises  Sometimes min A for safety  Gait training in hallway with quad cane  2x67ft with minimal cues for step length and head up 4x61ft finding cards on the wall with lateral gaze, close CGA to min  Needed for safety                                   PT Long Term Goals - 09/09/15 1506    PT LONG TERM GOAL #1   Title Patient will reduce timed up and go to <11 seconds to reduce fall risk and demonstrate improved transfer/gait ability.   Time 12   Period Weeks   PT LONG TERM GOAL #2   Title Patient will increase six minute walk test distance to >1000 for progression to community ambulator and improve gait ability   Time 12   Period Weeks   PT LONG TERM GOAL #3   Title Patient will increase 10 meter walk test to >1.45m/s as to improve gait speed for better community ambulation and to reduce fall risk.   Time 12   Period Weeks   PT LONG TERM GOAL #4   Title Patient will tolerate 5 seconds of single leg stance without loss of balance to improve ability to get in and out of shower safely.   Time 12   Period Weeks               Plan - 09/23/15 1808    Clinical Impression Statement pt shows good carry over  of gait training last week with attempting longer step length and upward gaze. he has trouble maintaining this when multitasking, and is thrown off balance with head movement. continued working on this in session today.    Pt will benefit from skilled therapeutic intervention in order to improve on the following deficits Abnormal gait;Decreased balance;Decreased mobility;Difficulty walking;Impaired tone;Decreased activity tolerance;Decreased strength;Impaired UE functional use;Decreased safety awareness   Rehab Potential Good   PT Frequency 2x / week   PT Duration 12 weeks   PT Treatment/Interventions Neuromuscular re-education;Balance training;Therapeutic exercise;Therapeutic activities;Stair training;Gait training;Functional mobility training   PT Next Visit Plan Balance training and strengthening   PT Home Exercise Plan sit to stand, squats, heel raises   Consulted and Agree with Plan of Care Patient        Problem List There are no active problems to display  for this patient. Michael Bush, PT, DPT [phone]   Michael Bush 09/23/2015, 6:10 PM  [place] MAIN [facility] SERVICES [address] [place], [place], [address] Phone: [phone]   Fax:  [phone]  Name: Michael Bush MRN: [id number] Date of Birth: Nov 30, 1940

## 2015-09-25 ENCOUNTER — Encounter: Payer: Self-pay | Admitting: Occupational Therapy

## 2015-09-25 ENCOUNTER — Ambulatory Visit: Payer: Medicare Other

## 2015-09-25 ENCOUNTER — Ambulatory Visit: Payer: Medicare Other | Admitting: Occupational Therapy

## 2015-09-25 DIAGNOSIS — R2681 Unsteadiness on feet: Secondary | ICD-10-CM

## 2015-09-25 DIAGNOSIS — R262 Difficulty in walking, not elsewhere classified: Secondary | ICD-10-CM

## 2015-09-25 DIAGNOSIS — M6281 Muscle weakness (generalized): Secondary | ICD-10-CM

## 2015-09-25 NOTE — Therapy (Signed)
[place] MAIN [facility] SERVICES [address] [place], [place], [address] Phone: [phone]   Fax:  [phone]  Physical Therapy Treatment  Patient Details  Name: Michael Bush MRN: [id number] Date of Birth: July 28, 1941 Referring Provider: Elmyra Ricks Date: 09/25/2015      PT End of Session - 09/25/15 1822    Visit Number 5   Number of Visits 25   Date for PT Re-Evaluation 10/06/15   Authorization Type 5/10   PT Start Time 1600   PT Stop Time 1644   PT Time Calculation (min) 44 min   Equipment Utilized During Treatment Gait belt   Activity Tolerance Patient limited by fatigue   Behavior During Therapy [facility] for tasks assessed/performed      Past Medical History  Diagnosis Date  . COPD (chronic obstructive pulmonary disease) ([place])   . Diabetes mellitus without complication ([place])   . GERD (gastroesophageal reflux disease)   . Chronic kidney disease   . Myocardial infarction ([place])   . Stroke ([place])   . Cancer [facility])     Past Surgical History  Procedure Laterality Date  . Colon surgery      There were no vitals filed for this visit.  Visit Diagnosis:  Difficulty walking  Unsteady gait      Subjective Assessment - 09/25/15 1822    Subjective pt reports he is doing good today. his back hurts a little however   Pain Score 4    Pain Location Back       Nustep L 3 with L hand assist attachment x 3 min no charge  Fwd/retro step backs with weight shift no UE x 10 each bilaterally Standing on AIREX beam- AP; with horiz head turns 3x1 min Standing on AIREX with balloon toss 20 x 3  : cga to min A for safety standingon AIREX beam with semi tandem 30s x 4  pt requires CGA for safety on balance exercises   thereX:    In // bars side steps with squat no UE x 3 laps Fwd step up 6in step single UE x 12 each leg Standing hip abd with 2lb ankle weights 2x10 each  Pt requires min verbal and tactile cues for  proper exercise performance                               PT Long Term Goals - 09/09/15 1506    PT LONG TERM GOAL #1   Title Patient will reduce timed up and go to <11 seconds to reduce fall risk and demonstrate improved transfer/gait ability.   Time 12   Period Weeks   PT LONG TERM GOAL #2   Title Patient will increase six minute walk test distance to >1000 for progression to community ambulator and improve gait ability   Time 12   Period Weeks   PT LONG TERM GOAL #3   Title Patient will increase 10 meter walk test to >1.50m/s as to improve gait speed for better community ambulation and to reduce fall risk.   Time 12   Period Weeks   PT LONG TERM GOAL #4   Title Patient will tolerate 5 seconds of single leg stance without loss of balance to improve ability to get in and out of shower safely.   Time 12   Period Weeks               Plan -  09/25/15 1822    Clinical Impression Statement pt continues to show good carry over of improved gait quality with help of his wife for cuing at home. pt has most difficutly with upright posture and gaze during exercises. progressed dynamic balance exercises today with appropriate challenge. better performance of step ups as well today. pt is progressing nicely   Pt will benefit from skilled therapeutic intervention in order to improve on the following deficits Abnormal gait;Decreased balance;Decreased mobility;Difficulty walking;Impaired tone;Decreased activity tolerance;Decreased strength;Impaired UE functional use;Decreased safety awareness   Rehab Potential Good   PT Frequency 2x / week   PT Duration 12 weeks   PT Treatment/Interventions Neuromuscular re-education;Balance training;Therapeutic exercise;Therapeutic activities;Stair training;Gait training;Functional mobility training   PT Next Visit Plan Balance training and strengthening   PT Home Exercise Plan sit to stand, squats, heel raises   Consulted and Agree with  Plan of Care Patient        Problem List There are no active problems to display for this patient.  Gorden Harms. Qianna Clagett, PT, DPT [phone]  Lateya Dauria 09/25/2015, 6:24 PM  [place] MAIN [facility] SERVICES [address] [place], [place], [address] Phone: [phone]   Fax:  [phone]  Name: Rhet Bernosky MRN: [id number] Date of Birth: Dec 06, 1940

## 2015-09-25 NOTE — Therapy (Signed)
[place] MAIN [facility] SERVICES [address] [place], [place], [address] Phone: [phone]   Fax:  [phone]  Occupational Therapy Treatment  Patient Details  Name: Michael Bush MRN: [id number] Date of Birth: Jan 23, 1941 No Data Recorded  Encounter Date: 09/25/2015      OT End of Session - 09/25/15 1507    Visit Number 8   Number of Visits 24   Date for OT Re-Evaluation 11/25/15   OT Start Time 1500   OT Stop Time 1545   OT Time Calculation (min) 45 min   Behavior During Therapy [facility] for tasks assessed/performed      Past Medical History  Diagnosis Date  . COPD (chronic obstructive pulmonary disease) ([place])   . Diabetes mellitus without complication ([place])   . GERD (gastroesophageal reflux disease)   . Chronic kidney disease   . Myocardial infarction ([place])   . Stroke ([place])   . Cancer [facility])     Past Surgical History  Procedure Laterality Date  . Colon surgery      There were no vitals filed for this visit.  Visit Diagnosis:  Muscle weakness of left arm      Subjective Assessment - 09/25/15 1505    Subjective  Reports reduced pain in L shoulder   Patient is accompained by: Family member   Pain Score 2     Stretch L UE into  shoulder flexion, external and internal rotation elbow flexion and extension, forearm supination and pronation, wrist flexion and extension, and hand flexion and extension. Stretched extra in shoulder flexion and external rotation as he is getting tight. Repeated same (supine) with active assistive and added eccentric elbow flexion. Weight bearing through out session to normalize tone. Patient stretched into reflex inhibiting pattern through out session to aid in normalizing tone. Cues needed for positioning and technique.                                OT Long Term Goals - 09/18/15 1501    OT LONG TERM GOAL #1   Title Patient will be able to dress himself with help with  fasteners.   Baseline Needs moderate assist and dependent for buttons   Time 12   Period Weeks   Status New   OT LONG TERM GOAL #2   Title Will be able to complete bathing with supervision    Baseline needs assist to bathe and dry   Time 12   Period Weeks   Status New   OT LONG TERM GOAL #3   Title Patient will be able to cut food   Baseline unable   Time 12   Period Weeks   Status New   OT LONG TERM GOAL #4   Title Will be able to tie shoe (one handed)   Baseline 12   Time 12   Period Weeks   Status New   OT LONG TERM GOAL #5   Title Will increase L UE strength to at least 3+/5 to aid in ADL goals.   Baseline Minimal use of L UE   Time 12   Period Weeks   Status New               Plan - 09/25/15 1509    Clinical Impression Statement 74 year old male who came to [facility] after a stoke in Oct. 2016. He is progressing with his ADL goals and UE  goals. Paatient reports reduced pain in left shoulder however right shoulder pain (chronic) interferes with ADL.   Pt will benefit from skilled therapeutic intervention in order to improve on the following deficits (Retired) Decreased activity tolerance;Decreased balance;Decreased coordination;Decreased endurance;Decreased knowledge of use of DME;Decreased mobility;Decreased range of motion;Decreased strength;Difficulty walking;Impaired tone;Impaired UE functional use   Clinical Impairments Affecting Rehab Potential severe L UE deficits.   OT Treatment/Interventions Self-care/ADL training;Electrical Stimulation;Neuromuscular education;Therapeutic exercise;DME and/or AE instruction;Manual Therapy;Therapeutic activities;Therapeutic exercises;Passive range of motion        Problem List There are no active problems to display for this patient.  Sharon Mt, MS/OTR/L  Sharon Mt 09/25/2015, 3:45 PM  [place] MAIN [facility] SERVICES [address] [place], [place], [address] Phone: [phone]    Fax:  [phone]  Name: Michael Bush MRN: [id number] Date of Birth: September 02, 1941

## 2015-09-30 ENCOUNTER — Ambulatory Visit: Payer: Medicare Other | Attending: Rehabilitation | Admitting: Occupational Therapy

## 2015-09-30 ENCOUNTER — Ambulatory Visit: Payer: Medicare Other

## 2015-09-30 ENCOUNTER — Encounter: Payer: Self-pay | Admitting: Occupational Therapy

## 2015-09-30 DIAGNOSIS — M6281 Muscle weakness (generalized): Secondary | ICD-10-CM

## 2015-09-30 DIAGNOSIS — R2681 Unsteadiness on feet: Secondary | ICD-10-CM | POA: Diagnosis present

## 2015-09-30 DIAGNOSIS — R262 Difficulty in walking, not elsewhere classified: Secondary | ICD-10-CM | POA: Diagnosis present

## 2015-09-30 NOTE — Patient Instructions (Signed)
Instructed patient and wife in anti edema massage and positioning.

## 2015-09-30 NOTE — Therapy (Signed)
[place] MAIN [facility] SERVICES [address] [place], [place], [address] Phone: [phone]   Fax:  [phone]  Physical Therapy Treatment  Patient Details  Name: Michael Bush MRN: [id number] Date of Birth: 06-Dec-1940 Referring Provider: Elmyra Ricks Date: 09/30/2015      PT End of Session - 09/30/15 1534    Visit Number 6   Number of Visits 25   Date for PT Re-Evaluation 10/28/15   Authorization Type 1/10   PT Start Time 3335   PT Stop Time 1530   PT Time Calculation (min) 45 min   Equipment Utilized During Treatment Gait belt   Activity Tolerance Patient limited by fatigue   Behavior During Therapy [facility] for tasks assessed/performed      Past Medical History  Diagnosis Date  . COPD (chronic obstructive pulmonary disease) ([place])   . Diabetes mellitus without complication ([place])   . GERD (gastroesophageal reflux disease)   . Chronic kidney disease   . Myocardial infarction ([place])   . Stroke ([place])   . Cancer [facility])     Past Surgical History  Procedure Laterality Date  . Colon surgery      There were no vitals filed for this visit.  Visit Diagnosis:  Muscle weakness of left arm  Unsteady gait      Subjective Assessment - 09/30/15 1533    Subjective pt reports he sometimes feels like he can walk without the cane   Currently in Pain? Yes   Pain Score 4    Pain Location --  L shoulder       Therex: PT assessed outcome measures and progress towards goals: TUG: 20.3s 5s sit to stand: 17s 61mn walk test: [id number] test: 0.62 m/s  Treadmill ambuation 0.8 mph [id number] cues for increased step length  McConnell tape applied for L shoulder sublux at no charge for pain relief                            PT Education - 09/30/15 1534    Education provided Yes   Education Details progress towards goals, POC    Person(s) Educated Patient;Spouse   Methods Explanation   Comprehension Verbalized understanding             PT Long Term Goals - 09/30/15 1547    PT LONG TERM GOAL #1   Title Patient will reduce timed up and go to <11 seconds to reduce fall risk and demonstrate improved transfer/gait ability.   Time 12   Period Weeks   Status Partially Met   PT LONG TERM GOAL #2   Title Patient will increase six minute walk test distance to >1000 for progression to community ambulator and improve gait ability   Time 12   Period Weeks   Status Partially Met   PT LONG TERM GOAL #3   Title Patient will increase 10 meter walk test to >1.33m/s53m to improve gait speed for better community ambulation and to reduce fall risk.   Time 12   Period Weeks   Status Partially Met   PT LONG TERM GOAL #4   Title Patient will tolerate 5 seconds of single leg stance without loss of balance to improve ability to get in and out of shower safely.   Time 12   Period Weeks   Status Partially Met  Plan - 13-Oct-2015 1536    Clinical Impression Statement pt is making good progress towards including balance, strength and gait. he is limited by his endurance, however. pt would benefit from further PT skilled services to improve strength, mobility, and balance to maximize funcitonal mobility.    Pt will benefit from skilled therapeutic intervention in order to improve on the following deficits Abnormal gait;Decreased balance;Decreased mobility;Difficulty walking;Impaired tone;Decreased activity tolerance;Decreased strength;Impaired UE functional use;Decreased safety awareness   Rehab Potential Good   PT Frequency 2x / week   PT Duration 12 weeks   PT Treatment/Interventions Neuromuscular re-education;Balance training;Therapeutic exercise;Therapeutic activities;Stair training;Gait training;Functional mobility training   PT Next Visit Plan Balance training and strengthening   PT Home Exercise Plan sit to stand, squats, heel raises   Consulted and Agree with  Plan of Care Patient          G-Codes - 13-Oct-2015 1542    Functional Assessment Tool Used TUG 10 MW, 6 MW , 5 x sit to stand   Functional Limitation Mobility: Walking and moving around   Mobility: Walking and Moving Around Current Status [id number]) At least 40 percent but less than 60 percent impaired, limited or restricted   Mobility: Walking and Moving Around Goal Status [phone]) At least 20 percent but less than 40 percent impaired, limited or restricted      Problem List There are no active problems to display for this patient.  Gorden Harms. Sims Laday, PT, DPT [phone]  Levenia Skalicky 10/13/15, 3:48 PM  [place] MAIN [facility] SERVICES [address] [place], [place], [address] Phone: [phone]   Fax:  [phone]  Name: Michael Bush MRN: [id number] Date of Birth: 10-10-1940

## 2015-09-30 NOTE — Therapy (Signed)
[place] MAIN [facility] SERVICES [address] [place], [place], [address] Phone: [phone]   Fax:  [phone]  Occupational Therapy Treatment  Patient Details  Name: Michael Bush MRN: [id number] Date of Birth: 07/15/1941 No Data Recorded  Encounter Date: 09/30/2015      OT End of Session - 09/30/15 1534    Visit Number 9   Number of Visits 24   Date for OT Re-Evaluation 11/25/15   OT Start Time 1400   OT Stop Time 1445   OT Time Calculation (min) 45 min      Past Medical History  Diagnosis Date  . COPD (chronic obstructive pulmonary disease) ([place])   . Diabetes mellitus without complication ([place])   . GERD (gastroesophageal reflux disease)   . Chronic kidney disease   . Myocardial infarction ([place])   . Stroke ([place])   . Cancer [facility])     Past Surgical History  Procedure Laterality Date  . Colon surgery      There were no vitals filed for this visit.  Visit Diagnosis:  Muscle weakness of left arm      Subjective Assessment - 09/30/15 1532    Subjective  I can put my shirt on now.   Patient is accompained by: Family member    Stretch L UE into shoulder flexion, external and internal rotation elbow flexion and extension, forearm supination and pronation, wrist flexion and extension, and hand flexion and extension. Continue to stretched extra in shoulder flexion and external rotation as he is getting tight. Repeated same (supine) with active assistive and added eccentric elbow flexion. Weight bearing through out session to normalize tone. Patient stretched into reflex inhibiting pattern through out session to aid in normalizing tone. L hand starting to show edema. Measured just proximal to mcp joints of fingers 2, 3, 4, and 5. R Hand 230 mm L hand 250 mm.  Instructed in anti edema massage and positioning to inhibit edema. Cues needed for positioning and technique.                           OT Education -  09/30/15 1534    Education provided Yes   Education Details Regarding why you get hand edema with a stroke.   Person(s) Educated Patient;Spouse   Methods Explanation;Demonstration;Verbal cues   Comprehension Verbalized understanding             OT Long Term Goals - 09/18/15 1501    OT LONG TERM GOAL #1   Title Patient will be able to dress himself with help with fasteners.   Baseline Needs moderate assist and dependent for buttons   Time 12   Period Weeks   Status New   OT LONG TERM GOAL #2   Title Will be able to complete bathing with supervision    Baseline needs assist to bathe and dry   Time 12   Period Weeks   Status New   OT LONG TERM GOAL #3   Title Patient will be able to cut food   Baseline unable   Time 12   Period Weeks   Status New   OT LONG TERM GOAL #4   Title Will be able to tie shoe (one handed)   Baseline 12   Time 12   Period Weeks   Status New   OT LONG TERM GOAL #5   Title Will increase L UE strength to at least 3+/5 to  aid in ADL goals.   Baseline Minimal use of L UE   Time 12   Period Weeks   Status New               Plan - 09/30/15 1535    Clinical Impression Statement 75 year old male who came to [facility] after a stroke in Oct. 2016. He continues to progress with L UE and ADL goals. Pronation and supination are now more consitant. He is now able to donn shirt independently   Pt will benefit from skilled therapeutic intervention in order to improve on the following deficits (Retired) Decreased activity tolerance;Decreased balance;Decreased coordination;Decreased endurance;Decreased knowledge of use of DME;Decreased mobility;Decreased range of motion;Decreased strength;Difficulty walking;Impaired tone;Impaired UE functional use   Clinical Impairments Affecting Rehab Potential severe L UE deficits.   OT Treatment/Interventions Self-care/ADL training;Electrical Stimulation;Neuromuscular education;Therapeutic exercise;DME and/or AE  instruction;Manual Therapy;Therapeutic activities;Therapeutic exercises;Passive range of motion   Consulted and Agree with Plan of Care Patient   Family Member Consulted wife        Problem List There are no active problems to display for this patient.   Sharon Mt 09/30/2015, 3:39 PM  [place] MAIN [facility] SERVICES [address] [place], [place], [address] Phone: [phone]   Fax:  [phone]  Name: Michael Bush MRN: [id number] Date of Birth: [birth year]

## 2015-10-02 ENCOUNTER — Ambulatory Visit: Payer: Medicare Other

## 2015-10-02 ENCOUNTER — Ambulatory Visit: Payer: Medicare Other | Admitting: Occupational Therapy

## 2015-10-07 ENCOUNTER — Ambulatory Visit: Payer: Medicare Other

## 2015-10-07 ENCOUNTER — Ambulatory Visit: Payer: Medicare Other | Admitting: Occupational Therapy

## 2015-10-09 ENCOUNTER — Encounter: Payer: Self-pay | Admitting: Occupational Therapy

## 2015-10-09 ENCOUNTER — Ambulatory Visit: Payer: Medicare Other

## 2015-10-09 ENCOUNTER — Ambulatory Visit: Payer: Medicare Other | Admitting: Occupational Therapy

## 2015-10-09 DIAGNOSIS — M6281 Muscle weakness (generalized): Secondary | ICD-10-CM

## 2015-10-09 DIAGNOSIS — R2681 Unsteadiness on feet: Secondary | ICD-10-CM

## 2015-10-09 NOTE — Therapy (Signed)
[place] MAIN [facility] SERVICES [address] [place], [place], [address] Phone: [phone]   Fax:  [phone]  Occupational Therapy Treatment/Progress Note  Patient Details  Name: Michael Bush MRN: [id number] Date of Birth: 1941/07/24 No Data Recorded  Encounter Date: 10/09/2015    Past Medical History  Diagnosis Date  . COPD (chronic obstructive pulmonary disease) ([place])   . Diabetes mellitus without complication ([place])   . GERD (gastroesophageal reflux disease)   . Chronic kidney disease   . Myocardial infarction ([place])   . Stroke ([place])   . Cancer [facility])     Past Surgical History  Procedure Laterality Date  . Colon surgery      There were no vitals filed for this visit.  Visit Diagnosis:  Muscle weakness of left arm In supine stretched left upper extremity  shoulder flexion, external and internal rotation elbow flexion and extension, forearm supination and pronation, wrist flexion and extension, and hand flexion and extension.  Repeated these motions with active assistive range. Patient stretched into reflex inhibiting pattern through out session to aid in normalizing tone. Completed grasp release (thumb and index only) after stretching. Able to grasp and release a business card. This 75 year old male came to [facility] out patient after a stroke in Oct. 2016. He has achieved 2 of 5 goals and made progress with the other 3. He dresses himself except for fasteners. He can complete a one handed shoe tie, he bathes himself completely except for under right arm,  L UE strength and range of motion has improved and is as follows shoulder flexion 71o, 2/5 - elbow flexion 114o 3/5, elbow extension to -21o, 4-/5, forearm supination varies 2-/5, pronation full 2-/5, wrist extension -5o 2-/5,   wrist flexion  30o 2-/5 hand movement 10o can now oppose thumb and index finger.                                OT Long Term Goals -  10/09/15 1550    OT LONG TERM GOAL #1   Title Patient will be able to dress himself with help with fasteners.   Time 12   Period Weeks   Status Achieved   OT LONG TERM GOAL #2   Title Will be able to complete bathing with supervision    Time 12   Period Weeks   Status Achieved   OT LONG TERM GOAL #3   Title Patient will be able to cut food   Time 12   Period Weeks   Status On-going   OT LONG TERM GOAL #4   Title Will be able to tie shoe (one handed)   Baseline 12   Period Weeks   Status Achieved   OT LONG TERM GOAL #5   Title Will increase L UE strength to at least 3+/5 to aid in ADL goals.   Time 12   Period Weeks   Status Partially Met               Plan - 10/09/15 1528    Clinical Impression Statement This 75 year old male came to [facility] out patient after a stroke in Oct. 2016. He has achieved 2 of 5 goals and made progress with the other 3. He dresses himself except for fasteners. He can complete a one handed shoe tie, he bathes himself completely except for under right arm,  L UE strength and  range of motion has improved and is as follows shoulder flexion 71o, 2/5 - elbow flexion 114o 3/5, elbow extension to -21o, 4-/5, forearm supination varies 2-/5, pronation full 2-/5, wrist extension -5o 2-/5,   wrist flexion  30o 2-/5 hand movement 10o can now oppose thumb and index finger.   Pt will benefit from skilled therapeutic intervention in order to improve on the following deficits (Retired) Decreased activity tolerance;Decreased balance;Decreased coordination;Decreased endurance;Decreased knowledge of use of DME;Decreased mobility;Decreased range of motion;Decreased strength;Difficulty walking;Impaired tone;Impaired UE functional use   OT Treatment/Interventions Self-care/ADL training;Electrical Stimulation;Neuromuscular education;Therapeutic exercise;DME and/or AE instruction;Manual Therapy;Therapeutic activities;Therapeutic exercises;Passive range of motion           G-Codes - Oct 31, 2015 1523    Functional Assessment Tool Used clinical jusdgment   Functional Limitation Self care   Self Care Current Status (Q2595) At least 40 percent but less than 60 percent impaired, limited or restricted   Self Care Goal Status (G3875) At least 20 percent but less than 40 percent impaired, limited or restricted      Problem List There are no active problems to display for this patient.  Sharon Mt, MS/OTR/L  Sharon Mt Oct 31, 2015, 3:52 PM  [place] MAIN [facility] SERVICES [address] [place], [place], [address] Phone: [phone]   Fax:  [phone]  Name: Michael Bush MRN: [id number] Date of Birth: 1940/11/27

## 2015-10-09 NOTE — Therapy (Signed)
[place] MAIN [facility] SERVICES [address] [place], [place], [address] Phone: [phone]   Fax:  [phone]  Physical Therapy Treatment  Patient Details  Name: Michael Bush MRN: [id number] Date of Birth: [birth year] Referring Provider: Elmyra Ricks Date: 10/09/2015      PT End of Session - 10/09/15 1614    Visit Number 7   Number of Visits 25   Date for PT Re-Evaluation 10/28/15   Authorization Type 2/10   PT Start Time 3545   PT Stop Time 1430   PT Time Calculation (min) 45 min   Equipment Utilized During Treatment Gait belt   Activity Tolerance Patient limited by fatigue   Behavior During Therapy [facility] for tasks assessed/performed      Past Medical History  Diagnosis Date  . COPD (chronic obstructive pulmonary disease) ([place])   . Diabetes mellitus without complication ([place])   . GERD (gastroesophageal reflux disease)   . Chronic kidney disease   . Myocardial infarction ([place])   . Stroke ([place])   . Cancer [facility])     Past Surgical History  Procedure Laterality Date  . Colon surgery      There were no vitals filed for this visit.  Visit Diagnosis:  Muscle weakness of left arm  Unsteady gait      Subjective Assessment - 10/09/15 1613    Subjective pt reports he is feeling more off balance than he has. pt went to the doctor, who did not find any new medical issues.    Currently in Pain? Yes   Pain Score 2    Pain Location --  R shoulder       in hallway: gait training/analysis: 2x27f with quad cane: Pt demonstrates reduced knee flexion on the R during swing phase and increased ataxia. Pt cued for safety and improved weight shifting Following treatment in hallway walking 2x8106fsame cues as above, pt demonstrates improved gait pattern. Ambulated with HHA   pt requires CGA for safety on balance exercises  NMR: heel/toe rock/ weight shifting in // bars with Ue support initially, then without UE  support. Cues for increased knee flexion at toe off.  Standing march 3x10 with Ue support  pt requires CGA for safety on balance exercises                           PT Education - 10/09/15 1613    Education provided Yes   Education Details gait findings   Person(s) Educated Patient   Methods Explanation   Comprehension Verbalized understanding             PT Long Term Goals - 09/30/15 1547    PT LONG TERM GOAL #1   Title Patient will reduce timed up and go to <11 seconds to reduce fall risk and demonstrate improved transfer/gait ability.   Time 12   Period Weeks   Status Partially Met   PT LONG TERM GOAL #2   Title Patient will increase six minute walk test distance to >1000 for progression to community ambulator and improve gait ability   Time 12   Period Weeks   Status Partially Met   PT LONG TERM GOAL #3   Title Patient will increase 10 meter walk test to >1.75m9mas to improve gait speed for better community ambulation and to reduce fall risk.   Time 12   Period Weeks   Status Partially Met   PT  LONG TERM GOAL #4   Title Patient will tolerate 5 seconds of single leg stance without loss of balance to improve ability to get in and out of shower safely.   Time 12   Period Weeks   Status Partially Met               Plan - 10/09/15 1615    Clinical Impression Statement pt seems to have an increase in extensor synergy pattern of the RLE with slight increased spasticity. this is causing him to have reduced foot clearance during gait. session today focused on reducing this synergy pattern and working on knee flexion during swing phase. pt responded to this very well and was able to walk with improved foot clearance and improved safety following session.    Pt will benefit from skilled therapeutic intervention in order to improve on the following deficits Abnormal gait;Decreased balance;Decreased mobility;Difficulty walking;Impaired tone;Decreased  activity tolerance;Decreased strength;Impaired UE functional use;Decreased safety awareness   Rehab Potential Good   PT Frequency 2x / week   PT Duration 12 weeks   PT Treatment/Interventions Neuromuscular re-education;Balance training;Therapeutic exercise;Therapeutic activities;Stair training;Gait training;Functional mobility training   PT Next Visit Plan Balance training and strengthening   PT Home Exercise Plan sit to stand, squats, heel raises   Consulted and Agree with Plan of Care Patient        Problem List There are no active problems to display for this patient.  Michael Bush. Michael Bush, PT, DPT [phone]  Michael Bush 10/09/2015, 4:18 PM  [place] MAIN [facility] SERVICES [address] [place], [place], [address] Phone: [phone]   Fax:  [phone]  Name: Michael Bush MRN: [id number] Date of Birth: May 26, 1941

## 2015-10-14 ENCOUNTER — Ambulatory Visit: Payer: Medicare Other

## 2015-10-14 ENCOUNTER — Ambulatory Visit: Payer: Medicare Other | Admitting: Occupational Therapy

## 2015-10-14 VITALS — BP 146/81 | HR 60

## 2015-10-14 DIAGNOSIS — M6281 Muscle weakness (generalized): Secondary | ICD-10-CM | POA: Diagnosis not present

## 2015-10-14 DIAGNOSIS — R2681 Unsteadiness on feet: Secondary | ICD-10-CM

## 2015-10-14 DIAGNOSIS — R262 Difficulty in walking, not elsewhere classified: Secondary | ICD-10-CM

## 2015-10-14 NOTE — Patient Instructions (Signed)
Instructed wife in shoulder stretching for flexion and external rotation as stretching reduces pain.

## 2015-10-14 NOTE — Therapy (Signed)
[place] MAIN [facility] SERVICES [address] [place], [place], [address] Phone: [phone]   Fax:  [phone]  Occupational Therapy Treatment  Patient Details  Name: Michael Bush MRN: [id number] Date of Birth: 06-04-1941 No Data Recorded  Encounter Date: 10/14/2015      OT End of Session - 10/14/15 1709    Visit Number 10   Number of Visits 24   Date for OT Re-Evaluation 11/25/15   OT Start Time 1300   OT Stop Time 1345   OT Time Calculation (min) 45 min      Past Medical History  Diagnosis Date  . COPD (chronic obstructive pulmonary disease) ([place])   . Diabetes mellitus without complication ([place])   . GERD (gastroesophageal reflux disease)   . Chronic kidney disease   . Myocardial infarction ([place])   . Stroke ([place])   . Cancer [facility])     Past Surgical History  Procedure Laterality Date  . Colon surgery      There were no vitals filed for this visit.  Visit Diagnosis:  Muscle weakness of left arm      Subjective Assessment - 10/14/15 1707    Subjective  Left shoulder hurting some   Patient is accompained by: Family member    Patient stretched into reflex inhibiting pattern through out session to aid in normalizing tone in supine.  Left upper extremity stretched into shoulder flexion, external and internal rotation elbow flexion and extension, forearm supination and pronation, wrist flexion and extension, and hand flexion and extension. Repeated those motions with active assistive movements and add concentric/eccentric elbow extension with shoulder at 90o. Grasp release with a business card using fingers 1, 2, and 3. Mobilization of the shoulder to help normalize tone. Cues needed through out session for technique and safety.                            OT Education - 10/14/15 1708    Education provided Yes   Education Details difference between a stretching pain and a pinching pain (patient has a  stretching pain)   Person(s) Educated Patient;Spouse   Methods Explanation;Demonstration;Verbal cues   Comprehension Verbalized understanding             OT Long Term Goals - 10/09/15 [place] #1   Title Patient will be able to dress himself with help with fasteners.   Time 12   Period Weeks   Status Achieved   OT LONG TERM GOAL #2   Title Will be able to complete bathing with supervision    Time 12   Period Weeks   Status Achieved   OT LONG TERM GOAL #3   Title Patient will be able to cut food   Time 12   Period Weeks   Status On-going   OT LONG TERM GOAL #4   Title Will be able to tie shoe (one handed)   Baseline 12   Period Weeks   Status Achieved   OT LONG TERM GOAL #5   Title Will increase L UE strength to at least 3+/5 to aid in ADL goals.   Time 12   Period Weeks   Status Partially Met               Plan - 10/14/15 1710    Clinical Impression Statement Patient improving with reduced pain after stretching in shoulder flexion and external  rotation. Seems the muscles are tight and not impinged.     Pt will benefit from skilled therapeutic intervention in order to improve on the following deficits (Retired) Decreased activity tolerance;Decreased balance;Decreased coordination;Decreased endurance;Decreased knowledge of use of DME;Decreased mobility;Decreased range of motion;Decreased strength;Difficulty walking;Impaired tone;Impaired UE functional use   OT Treatment/Interventions Self-care/ADL training;Electrical Stimulation;Neuromuscular education;Therapeutic exercise;DME and/or AE instruction;Manual Therapy;Therapeutic activities;Therapeutic exercises;Passive range of motion        Problem List There are no active problems to display for this patient.  Sharon Mt, MS/OTR/L   Sharon Mt 10/14/2015, 5:14 PM  [place] MAIN [facility] SERVICES [address] [place], [place], [address] Phone:  [phone]   Fax:  [phone]  Name: Michael Bush MRN: [id number] Date of Birth: 1941-02-11

## 2015-10-14 NOTE — Therapy (Signed)
[place] MAIN [facility] SERVICES [address] [place], [place], [address] Phone: [phone]   Fax:  [phone]  Physical Therapy Treatment  Patient Details  Name: Michael Bush MRN: [id number] Date of Birth: [birth year] Referring Provider: Elmyra Ricks Date: 10/14/2015      PT End of Session - 10/14/15 1616    Visit Number 8   Number of Visits 25   Date for PT Re-Evaluation 10/28/15   Authorization Type 3/10   PT Start Time 1350   PT Stop Time 1430   PT Time Calculation (min) 40 min   Equipment Utilized During Treatment Gait belt   Activity Tolerance Patient limited by fatigue   Behavior During Therapy [facility] for tasks assessed/performed      Past Medical History  Diagnosis Date  . COPD (chronic obstructive pulmonary disease) ([place])   . Diabetes mellitus without complication ([place])   . GERD (gastroesophageal reflux disease)   . Chronic kidney disease   . Myocardial infarction ([place])   . Stroke ([place])   . Cancer [facility])     Past Surgical History  Procedure Laterality Date  . Colon surgery      Filed Vitals:   10/14/15 1354  BP: 146/81  Pulse: 60  SpO2: 95%    Visit Diagnosis:  Muscle weakness of left arm  Unsteady gait  Difficulty walking      Subjective Assessment - 10/14/15 1355    Subjective Pt states he is doing well on this date. He reports that today is a "fair" day with respect to his balance/gait quality. Pt reports he is performing HEP without issue. No specific questions or concerns on this date.    Currently in Pain? No/denies                                 PT Education - 10/14/15 1605    Education provided Yes   Education Details Ther-ex, HEP importance   Person(s) Educated Patient   Methods Explanation;Demonstration   Comprehension Verbalized understanding;Returned demonstration             PT Long Term Goals - 09/30/15 1547    PT LONG TERM GOAL #1    Title Patient will reduce timed up and go to <11 seconds to reduce fall risk and demonstrate improved transfer/gait ability.   Time 12   Period Weeks   Status Partially Met   PT LONG TERM GOAL #2   Title Patient will increase six minute walk test distance to >1000 for progression to community ambulator and improve gait ability   Time 12   Period Weeks   Status Partially Met   PT LONG TERM GOAL #3   Title Patient will increase 10 meter walk test to >1.42ms as to improve gait speed for better community ambulation and to reduce fall risk.   Time 12   Period Weeks   Status Partially Met   PT LONG TERM GOAL #4   Title Patient will tolerate 5 seconds of single leg stance without loss of balance to improve ability to get in and out of shower safely.   Time 12   Period Weeks   Status Partially Met        Treatment: Warm-up on NuStep L2 legs only x 4 minutes (2 minutes unbilled);  Gait Training In hallway: gait training/analysis: 4x829fwith quad cane: Pt demonstrates intermittent L steppage gait with intermittent L  toe drag. Pt progressed from QC to no assistive device for practice. Standing breaks provided;  Ther-ex Sit to stand 2 x 5 with external assist to increase WB on LLE; Standing heel raises with toes elevated with cues to bear as much weight in LLE as able in order to clear toes; Side stepping in // bars 4 laps x 2 without UE assist; Step-ups to 4" step alternating LE x 5 each without UE support;         Plan - 10/14/15 1617    Clinical Impression Statement Pt demeonstrates decreased extensor tone today during therapy session. He does have intermittent L toe drag during gait in hallway and overall gait quality worsens with extended distance. Pt able to practice ambulation in hallway without assistive device. Pt with decreased weight shifting to LLE during sit to stand activities. He also fatigues quickly and requires seated rest breaks throughout session.    Pt will  benefit from skilled therapeutic intervention in order to improve on the following deficits Abnormal gait;Decreased balance;Decreased mobility;Difficulty walking;Impaired tone;Decreased activity tolerance;Decreased strength;Impaired UE functional use;Decreased safety awareness   Rehab Potential Good   PT Frequency 2x / week   PT Duration 12 weeks   PT Treatment/Interventions Neuromuscular re-education;Balance training;Therapeutic exercise;Therapeutic activities;Stair training;Gait training;Functional mobility training   PT Next Visit Plan Balance training and strengthening   PT Home Exercise Plan sit to stand, squats, heel raises   Consulted and Agree with Plan of Care Patient        Problem List There are no active problems to display for this patient.  Phillips Grout PT, DPT   Dorthy Magnussen 10/14/2015, 4:20 PM  [place] MAIN [facility] SERVICES [address] [place], [place], [address] Phone: [phone]   Fax:  [phone]  Name: Jaylee Lantry MRN: [id number] Date of Birth: 03-Sep-1941

## 2015-10-16 ENCOUNTER — Ambulatory Visit: Payer: Medicare Other | Admitting: Occupational Therapy

## 2015-10-16 ENCOUNTER — Ambulatory Visit: Payer: Medicare Other

## 2015-10-21 ENCOUNTER — Encounter: Payer: Self-pay | Admitting: Occupational Therapy

## 2015-10-21 ENCOUNTER — Ambulatory Visit: Payer: Medicare Other | Admitting: Occupational Therapy

## 2015-10-21 ENCOUNTER — Ambulatory Visit: Payer: Medicare Other

## 2015-10-21 DIAGNOSIS — M6281 Muscle weakness (generalized): Secondary | ICD-10-CM | POA: Diagnosis not present

## 2015-10-21 DIAGNOSIS — R2681 Unsteadiness on feet: Secondary | ICD-10-CM

## 2015-10-21 NOTE — Therapy (Signed)
[place] MAIN [facility] SERVICES [address] [place], [place], [address] Phone: [phone]   Fax:  [phone]  Physical Therapy Treatment  Patient Details  Name: Michael Bush MRN: [id number] Date of Birth: March 14, 1941 Referring Provider: Elmyra Ricks Date: 10/21/2015      PT End of Session - 10/21/15 1552    Visit Number 9   Number of Visits 25   Date for PT Re-Evaluation 10/28/15   Authorization Type 4/10   PT Start Time 8938   PT Stop Time 1420   PT Time Calculation (min) 35 min   Equipment Utilized During Treatment Gait belt   Activity Tolerance Patient limited by fatigue   Behavior During Therapy [facility] for tasks assessed/performed      Past Medical History  Diagnosis Date  . COPD (chronic obstructive pulmonary disease) ([place])   . Diabetes mellitus without complication ([place])   . GERD (gastroesophageal reflux disease)   . Chronic kidney disease   . Myocardial infarction ([place])   . Stroke ([place])   . Cancer [facility])     Past Surgical History  Procedure Laterality Date  . Colon surgery      There were no vitals filed for this visit.  Visit Diagnosis:  Muscle weakness of left arm  Unsteady gait      Subjective Assessment - 10/21/15 1550    Subjective pt reports he has been nauseated for over a week. pts wife reports he is hardly eating and drinking and was vomiting yesterday. pts MD told him he would be able to be seen sooner at ER vs making an apt. pt reports he feels tired.    Currently in Pain? No/denies      Gait training  Vitals:  124/58mHg, HR 58BPM In hallway fwd / retro ambulation without AD, cues for step length and heel strike. Cues for longer L step length in reverse. X 10 min "stop/go" in hallway 2x865fPt required seated rest breaks  About every 16022fit to stand from elevated surface, with RLE elevated to focus weight bearing through LLE. 3x5 no UE.  Pt was in general more lethargic  vs. Previous sessions                         PT Education - 10/21/15 1552    Education provided Yes   Education Details symptoms of dehydration. recommend pt goes to ER as MD also recommended.   Person(s) Educated Patient   Methods Explanation   Comprehension Verbalized understanding             PT Long Term Goals - 09/30/15 1547    PT LONG TERM GOAL #1   Title Patient will reduce timed up and go to <11 seconds to reduce fall risk and demonstrate improved transfer/gait ability.   Time 12   Period Weeks   Status Partially Met   PT LONG TERM GOAL #2   Title Patient will increase six minute walk test distance to >1000 for progression to community ambulator and improve gait ability   Time 12   Period Weeks   Status Partially Met   PT LONG TERM GOAL #3   Title Patient will increase 10 meter walk test to >1.25m/74ms to improve gait speed for better community ambulation and to reduce fall risk.   Time 12   Period Weeks   Status Partially Met   PT LONG TERM GOAL #4   Title Patient will  tolerate 5 seconds of single leg stance without loss of balance to improve ability to get in and out of shower safely.   Time 12   Period Weeks   Status Partially Met               Plan - 10/21/15 1553    Clinical Impression Statement pt generally a little lethargic and pale during session. pts vitals were within normal range, however he did have a lower than his "normal" BP which could be due to prolonged fluid loss. PT ended session early with recommendation to go to ER for further evaluation based on 1+ week history of nausea/vommiting and potential dehydration. pt did have improved carry over of less LLE extensor tone during gait, but was challenged with walking backwards and changing speeds in session.    Pt will benefit from skilled therapeutic intervention in order to improve on the following deficits Abnormal gait;Decreased balance;Decreased mobility;Difficulty  walking;Impaired tone;Decreased activity tolerance;Decreased strength;Impaired UE functional use;Decreased safety awareness   Rehab Potential Good   PT Frequency 2x / week   PT Duration 12 weeks   PT Treatment/Interventions Neuromuscular re-education;Balance training;Therapeutic exercise;Therapeutic activities;Stair training;Gait training;Functional mobility training   PT Next Visit Plan Balance training and strengthening   PT Home Exercise Plan sit to stand, squats, heel raises   Consulted and Agree with Plan of Care Patient        Problem List There are no active problems to display for this patient.  Gorden Harms. Ellina Sivertsen, PT, DPT [phone]  Niomi Valent 10/21/2015, 3:56 PM  [place] MAIN [facility] SERVICES [address] [place], [place], [address] Phone: [phone]   Fax:  [phone]  Name: Mohmed Farver MRN: [id number] Date of Birth: 1940/10/29

## 2015-10-21 NOTE — Therapy (Signed)
[place] MAIN [facility] SERVICES [address] [place], [place], [address] Phone: [phone]   Fax:  [phone]  Occupational Therapy Treatment  Patient Details  Name: Michael Bush MRN: [id number] Date of Birth: March 27, 1941 No Data Recorded  Encounter Date: 10/21/2015      OT End of Session - 10/21/15 1352    Visit Number 11   Number of Visits 24   Date for OT Re-Evaluation 11/25/15   OT Start Time 1300   OT Stop Time 1345   OT Time Calculation (min) 45 min      Past Medical History  Diagnosis Date  . COPD (chronic obstructive pulmonary disease) ([place])   . Diabetes mellitus without complication ([place])   . GERD (gastroesophageal reflux disease)   . Chronic kidney disease   . Myocardial infarction ([place])   . Stroke ([place])   . Cancer [facility])     Past Surgical History  Procedure Laterality Date  . Colon surgery      There were no vitals filed for this visit.  Visit Diagnosis:  Muscle weakness of left arm      Subjective Assessment - 10/21/15 1350    Subjective  Reports not feeling well.    Patient is accompained by: Family member    Patient stretched into reflex inhibiting pattern through out session to aid in normalizing tone. Weight bearing done through out session to normalize tone. In supine, stretched L UE into shoulder flexion, external and internal rotation elbow flexion and extension, forearm supination and pronation, wrist flexion and extension, and hand flexion and extension.  Repeated same set of movements with active assist. Mobilization of scapula to help with tone. Eccentric elbow extension. Completed grasp and release with fingers 1-3 using a business card. Movements of these fingers are still very limited. Cues through out session for technique and positioning.                            OT Education - 10/21/15 1351    Education provided Yes   Education Details Regarding shoulder sublux and  why external rotation helps   Person(s) Educated Patient;Spouse   Methods Explanation;Demonstration   Comprehension Verbalized understanding;Verbal cues required;Tactile cues required             OT Long Term Goals - 10/09/15 1550    OT LONG TERM GOAL #1   Title Patient will be able to dress himself with help with fasteners.   Time 12   Period Weeks   Status Achieved   OT LONG TERM GOAL #2   Title Will be able to complete bathing with supervision    Time 12   Period Weeks   Status Achieved   OT LONG TERM GOAL #3   Title Patient will be able to cut food   Time 12   Period Weeks   Status On-going   OT LONG TERM GOAL #4   Title Will be able to tie shoe (one handed)   Baseline 12   Period Weeks   Status Achieved   OT LONG TERM GOAL #5   Title Will increase L UE strength to at least 3+/5 to aid in ADL goals.   Time 12   Period Weeks   Status Partially Met               Plan - 10/21/15 1352    Clinical Impression Statement Patient not feeling well, but still  shows good effort and some improvement with upper extremity function. He shows a little more finger extension (still very limited).   Pt will benefit from skilled therapeutic intervention in order to improve on the following deficits (Retired) Decreased activity tolerance;Decreased balance;Decreased coordination;Decreased endurance;Decreased knowledge of use of DME;Decreased mobility;Decreased range of motion;Decreased strength;Difficulty walking;Impaired tone;Impaired UE functional use   Clinical Impairments Affecting Rehab Potential severe L UE deficits.   OT Treatment/Interventions Self-care/ADL training;Electrical Stimulation;Neuromuscular education;Therapeutic exercise;DME and/or AE instruction;Manual Therapy;Therapeutic activities;Therapeutic exercises;Passive range of motion        Problem List There are no active problems to display for this patient.  Sharon Mt, MS/OTR/L  Sharon Mt 10/21/2015,  1:56 PM  [place] MAIN [facility] SERVICES [address] [place], [place], [address] Phone: [phone]   Fax:  [phone]  Name: Michael Bush MRN: [id number] Date of Birth: [birth year]

## 2015-10-23 ENCOUNTER — Ambulatory Visit: Payer: Medicare Other

## 2015-10-23 ENCOUNTER — Ambulatory Visit: Payer: Medicare Other | Admitting: Occupational Therapy

## 2015-10-28 ENCOUNTER — Ambulatory Visit: Payer: Medicare Other | Admitting: Occupational Therapy

## 2015-10-28 ENCOUNTER — Ambulatory Visit: Payer: Medicare Other

## 2015-10-30 ENCOUNTER — Ambulatory Visit: Payer: Medicare Other | Admitting: Occupational Therapy

## 2015-10-30 ENCOUNTER — Ambulatory Visit: Payer: Medicare Other

## 2015-11-01 DIAGNOSIS — K8689 Other specified diseases of pancreas: Secondary | ICD-10-CM | POA: Insufficient documentation

## 2015-11-01 DIAGNOSIS — R112 Nausea with vomiting, unspecified: Secondary | ICD-10-CM | POA: Insufficient documentation

## 2015-11-01 DIAGNOSIS — C189 Malignant neoplasm of colon, unspecified: Secondary | ICD-10-CM | POA: Insufficient documentation

## 2015-11-01 DIAGNOSIS — K118 Other diseases of salivary glands: Secondary | ICD-10-CM | POA: Insufficient documentation

## 2015-11-04 ENCOUNTER — Ambulatory Visit: Payer: Medicare Other | Admitting: Occupational Therapy

## 2015-11-06 ENCOUNTER — Ambulatory Visit: Payer: Medicare Other

## 2015-11-11 ENCOUNTER — Encounter: Payer: Medicare Other | Admitting: Occupational Therapy

## 2015-11-13 ENCOUNTER — Ambulatory Visit: Payer: Medicare Other

## 2015-11-17 ENCOUNTER — Ambulatory Visit: Payer: Medicare Other

## 2015-11-17 ENCOUNTER — Ambulatory Visit: Payer: Medicare Other | Admitting: Occupational Therapy

## 2015-11-18 ENCOUNTER — Encounter: Payer: Medicare Other | Admitting: Occupational Therapy

## 2015-11-18 ENCOUNTER — Ambulatory Visit: Payer: Medicare Other

## 2015-11-19 ENCOUNTER — Encounter: Payer: Medicare Other | Admitting: Occupational Therapy

## 2015-11-19 ENCOUNTER — Ambulatory Visit: Payer: Medicare Other

## 2015-11-25 ENCOUNTER — Ambulatory Visit: Payer: Medicare Other | Admitting: Occupational Therapy

## 2015-11-25 ENCOUNTER — Ambulatory Visit: Payer: Medicare Other

## 2015-11-26 DIAGNOSIS — K118 Other diseases of salivary glands: Secondary | ICD-10-CM | POA: Insufficient documentation

## 2015-11-27 ENCOUNTER — Ambulatory Visit: Payer: Medicare Other | Attending: Rehabilitation | Admitting: Occupational Therapy

## 2015-11-27 ENCOUNTER — Ambulatory Visit: Payer: Medicare Other

## 2015-12-02 ENCOUNTER — Ambulatory Visit: Payer: Medicare Other | Admitting: Occupational Therapy

## 2015-12-02 ENCOUNTER — Ambulatory Visit: Payer: Medicare Other

## 2015-12-04 ENCOUNTER — Ambulatory Visit: Payer: Medicare Other

## 2015-12-04 ENCOUNTER — Encounter: Payer: Medicare Other | Admitting: Occupational Therapy

## 2015-12-09 ENCOUNTER — Ambulatory Visit: Payer: Medicare Other

## 2015-12-09 ENCOUNTER — Encounter: Payer: Medicare Other | Admitting: Occupational Therapy

## 2015-12-11 ENCOUNTER — Encounter: Payer: Medicare Other | Admitting: Occupational Therapy

## 2015-12-11 ENCOUNTER — Ambulatory Visit: Payer: Medicare Other

## 2015-12-16 ENCOUNTER — Ambulatory Visit: Payer: Medicare Other

## 2015-12-16 ENCOUNTER — Encounter: Payer: Medicare Other | Admitting: Occupational Therapy

## 2015-12-18 ENCOUNTER — Ambulatory Visit: Payer: Medicare Other

## 2015-12-18 ENCOUNTER — Encounter: Payer: Medicare Other | Admitting: Occupational Therapy

## 2015-12-23 ENCOUNTER — Encounter: Payer: Medicare Other | Admitting: Occupational Therapy

## 2015-12-23 ENCOUNTER — Ambulatory Visit: Payer: Medicare Other

## 2015-12-25 ENCOUNTER — Encounter: Payer: Medicare Other | Admitting: Occupational Therapy

## 2015-12-25 ENCOUNTER — Ambulatory Visit: Payer: Medicare Other

## 2016-12-06 DIAGNOSIS — R310 Gross hematuria: Secondary | ICD-10-CM | POA: Insufficient documentation

## 2017-06-01 DIAGNOSIS — M899 Disorder of bone, unspecified: Secondary | ICD-10-CM | POA: Insufficient documentation

## 2017-06-02 ENCOUNTER — Inpatient Hospital Stay: Payer: Medicare Other | Attending: Oncology | Admitting: Oncology

## 2017-06-02 ENCOUNTER — Encounter: Payer: Self-pay | Admitting: Oncology

## 2017-06-02 ENCOUNTER — Inpatient Hospital Stay: Payer: Medicare Other | Admitting: Oncology

## 2017-06-02 ENCOUNTER — Inpatient Hospital Stay: Payer: Medicare Other

## 2017-06-02 VITALS — BP 167/86 | HR 60 | Temp 96.7°F | Resp 17 | Ht 67.72 in | Wt 184.5 lb

## 2017-06-02 DIAGNOSIS — J8410 Pulmonary fibrosis, unspecified: Secondary | ICD-10-CM | POA: Diagnosis not present

## 2017-06-02 DIAGNOSIS — E1122 Type 2 diabetes mellitus with diabetic chronic kidney disease: Secondary | ICD-10-CM | POA: Insufficient documentation

## 2017-06-02 DIAGNOSIS — M899 Disorder of bone, unspecified: Secondary | ICD-10-CM | POA: Diagnosis not present

## 2017-06-02 DIAGNOSIS — Z7982 Long term (current) use of aspirin: Secondary | ICD-10-CM | POA: Diagnosis not present

## 2017-06-02 DIAGNOSIS — I2510 Atherosclerotic heart disease of native coronary artery without angina pectoris: Secondary | ICD-10-CM | POA: Insufficient documentation

## 2017-06-02 DIAGNOSIS — Z7901 Long term (current) use of anticoagulants: Secondary | ICD-10-CM

## 2017-06-02 DIAGNOSIS — N1330 Unspecified hydronephrosis: Secondary | ICD-10-CM | POA: Diagnosis not present

## 2017-06-02 DIAGNOSIS — Z8781 Personal history of (healed) traumatic fracture: Secondary | ICD-10-CM | POA: Insufficient documentation

## 2017-06-02 DIAGNOSIS — Z923 Personal history of irradiation: Secondary | ICD-10-CM | POA: Diagnosis not present

## 2017-06-02 DIAGNOSIS — K869 Disease of pancreas, unspecified: Secondary | ICD-10-CM | POA: Diagnosis not present

## 2017-06-02 DIAGNOSIS — Z7984 Long term (current) use of oral hypoglycemic drugs: Secondary | ICD-10-CM | POA: Insufficient documentation

## 2017-06-02 DIAGNOSIS — I252 Old myocardial infarction: Secondary | ICD-10-CM | POA: Insufficient documentation

## 2017-06-02 DIAGNOSIS — K219 Gastro-esophageal reflux disease without esophagitis: Secondary | ICD-10-CM | POA: Diagnosis not present

## 2017-06-02 DIAGNOSIS — Z87891 Personal history of nicotine dependence: Secondary | ICD-10-CM | POA: Diagnosis not present

## 2017-06-02 DIAGNOSIS — Z8673 Personal history of transient ischemic attack (TIA), and cerebral infarction without residual deficits: Secondary | ICD-10-CM | POA: Diagnosis not present

## 2017-06-02 DIAGNOSIS — R221 Localized swelling, mass and lump, neck: Secondary | ICD-10-CM | POA: Diagnosis not present

## 2017-06-02 DIAGNOSIS — Z9221 Personal history of antineoplastic chemotherapy: Secondary | ICD-10-CM | POA: Diagnosis not present

## 2017-06-02 DIAGNOSIS — I517 Cardiomegaly: Secondary | ICD-10-CM | POA: Diagnosis not present

## 2017-06-02 DIAGNOSIS — C189 Malignant neoplasm of colon, unspecified: Secondary | ICD-10-CM | POA: Diagnosis not present

## 2017-06-02 DIAGNOSIS — I700 Atherosclerosis of aorta: Secondary | ICD-10-CM | POA: Diagnosis not present

## 2017-06-02 DIAGNOSIS — J449 Chronic obstructive pulmonary disease, unspecified: Secondary | ICD-10-CM | POA: Insufficient documentation

## 2017-06-02 DIAGNOSIS — Z950 Presence of cardiac pacemaker: Secondary | ICD-10-CM | POA: Diagnosis not present

## 2017-06-02 DIAGNOSIS — I482 Chronic atrial fibrillation, unspecified: Secondary | ICD-10-CM

## 2017-06-02 LAB — COMPREHENSIVE METABOLIC PANEL
ALBUMIN: 4.2 g/dL (ref 3.5–5.0)
ALT: 13 U/L — AB (ref 17–63)
AST: 19 U/L (ref 15–41)
Alkaline Phosphatase: 84 U/L (ref 38–126)
Anion gap: 9 (ref 5–15)
BUN: 11 mg/dL (ref 6–20)
CHLORIDE: 104 mmol/L (ref 101–111)
CO2: 28 mmol/L (ref 22–32)
CREATININE: 0.82 mg/dL (ref 0.61–1.24)
Calcium: 9.8 mg/dL (ref 8.9–10.3)
GFR calc Af Amer: >60 mL/min (ref >60)
GLUCOSE: 106 mg/dL — AB (ref 65–99)
Potassium: 4.3 mmol/L (ref 3.5–5.1)
Sodium: 141 mmol/L (ref 135–145)
Total Bilirubin: 0.6 mg/dL (ref 0.3–1.2)
Total Protein: 8.4 g/dL — ABNORMAL HIGH (ref 6.5–8.1)

## 2017-06-02 LAB — CBC WITH DIFFERENTIAL/PLATELET
BASOS ABS: 0 10*3/uL (ref 0–0.1)
BASOS PCT: 1 %
EOS PCT: 3 %
Eosinophils Absolute: 0.2 10*3/uL (ref 0–0.7)
HEMATOCRIT: 45.1 % (ref 40.0–52.0)
Hemoglobin: 15.1 g/dL (ref 13.0–18.0)
LYMPHS PCT: 16 %
Lymphs Abs: 1.1 10*3/uL (ref 1.0–3.6)
MCH: 28.2 pg (ref 26.0–34.0)
MCHC: 33.5 g/dL (ref 32.0–36.0)
MCV: 84.1 fL (ref 80.0–100.0)
Monocytes Absolute: 0.4 10*3/uL (ref 0.2–1.0)
Monocytes Relative: 7 %
NEUTROS ABS: 5 10*3/uL (ref 1.4–6.5)
Neutrophils Relative %: 73 %
PLATELETS: 220 10*3/uL (ref 150–440)
RBC: 5.36 MIL/uL (ref 4.40–5.90)
RDW: 16.2 % — ABNORMAL HIGH (ref 11.5–14.5)
WBC: 6.8 10*3/uL (ref 3.8–10.6)

## 2017-06-02 LAB — PSA: PROSTATIC SPECIFIC ANTIGEN: 0.08 ng/mL (ref 0.00–4.00)

## 2017-06-02 NOTE — Progress Notes (Signed)
New referral patient for Left shoulder tumor. Had a fall 3 weeks ago. Fractured shoulder and 2 ribs. Hx Stage IV colon cancer in 2003 at [facility] in [facility]. Had chemo and radiation. Medical Hx includes multiple strokes, unable ro ambulate basically wheelchair bound. Has a Pacemaker and takes coumadin. Rates pain in shoulder a 4-5 /10. Takes oxycodone with relief. Appetite is diminshed.

## 2017-06-02 NOTE — Progress Notes (Signed)
Hematology/Oncology Consult note [facility] [phone] [phone]  CONSULT NOTE Patient Care Team: Clarisse Gouge, MD as PCP - General (Family Medicine)  CHIEF COMPLAINTS/PURPOSE OF CONSULTATION:  I was told that I have bone cancer.   HISTORY OF PRESENTING ILLNESS:  Michael Bush 76 y.o.  male with past medical history listed as below was referred by Dr. Prescott Parma for evaluation of his recent pathological fracture/lesion of left humerus. Patient is accompanied by his wife and daughter.  Patient had a fall on 05/01/2017 went to emergency room. X-ray initially showed arthritis. Patient continued to have pain and had a CT scan of his left humerus with a chest. CT left upper extremity showed - Nondisplaced pathologic fracture of the proximal left humerus.- Nondisplaced fractures of the posterior 4th-6th ribs.- Soft tissue lesions within the proximal humerus, concerning for metastatic disease.CT chest showed  Acute, minimally displaced left posterior 4-6 rib fractures at the costovertebral junction.-- Transverse fracture of the left proximal humerus.-- No lung consolidation.-- Partially imaged severe left hydronephrosis and cortical atrophy, as seen on prior examinations. Patient has been seen by orthopedic surgeon Dr. Prescott Parma and referred to see me for additional workup. With further questioning, patient reports that he has a history of colon cancer which was diagnosed back in 2003 at [facility]. Patient and family were told that he has stage IV colon cancer. Patient reports that he has received surgery, chemotherapy and radiation therapy, he cannot remember details about the chemotherapy regimen. 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 Maudie Mercury at [facility]. Per care everywhere records, surgical options including pancreaticoduodenectomy for resection. Patient did not follow-up. Patient tells me that after  hearing the extent of surgery, he decided not to have additional surgical workup.  Patient reports feeling fatigued, lack of appetite, an intentional weight loss recently. He lives with wife  Who is his caregiver. Due to the residual neurologic deficit including left-sided weakness, he cannot walk independently.    ROS:  Review of Systems  Constitutional: Positive for fatigue and unexpected weight change.  HENT:  Negative.   Eyes: Negative.   Respiratory: Negative.   Cardiovascular: Negative.   Gastrointestinal: Positive for diarrhea.  Endocrine: Negative.   Genitourinary: Negative.    Musculoskeletal: Positive for gait problem.       Left arm pain.   Neurological: Positive for gait problem.       Left side weakness, chronic, residue deficit from stroke.   Hematological: Negative.   Psychiatric/Behavioral: Negative.     MEDICAL HISTORY:  Past Medical History:  Diagnosis Date  . Cancer ([place])   . Chronic kidney disease   . COPD (chronic obstructive pulmonary disease) ([place])   . Diabetes mellitus without complication ([place])   . GERD (gastroesophageal reflux disease)   . Myocardial infarction   . Stroke [facility])     SURGICAL HISTORY: Past Surgical History:  Procedure Laterality Date  . COLON SURGERY      SOCIAL HISTORY: Social History   Social History  . Marital status: Married    Spouse name: N/A  . Number of children: N/A  . Years of education: N/A   Occupational History  . Not on file.   Social History Main Topics  . Smoking status: Former Smoker    Types: Cigarettes  . Smokeless tobacco: Not on file  . Alcohol use Not on file  . Drug use: Unknown  . Sexual activity: Not on  file   Other Topics Concern  . Not on file   Social History Narrative  . No narrative on file    FAMILY HISTORY: Family History  Problem Relation Age of Onset  . Cancer Father     ALLERGIES:  has no allergies on file.  MEDICATIONS:  Current Outpatient Prescriptions    Medication Sig Dispense Refill  . acetaminophen (TYLENOL) 325 MG tablet Take by mouth.    Marland Kitchen aspirin 81 MG chewable tablet Chew 81 mg by mouth every morning.    Marland Kitchen atorvastatin (LIPITOR) 10 MG tablet 10 mg at bedtime.    . B Complex Vitamins (VITAMIN B COMPLEX PO) Take 2 tablets by mouth daily.    . Calcium Carb-Ergocalciferol 500-200 MG-UNIT TABS Take 2 tablets by mouth daily.    . cefdinir (OMNICEF) 300 MG capsule Take 300 mg by mouth 2 (two) times daily. Take for 10 days    . citalopram (CELEXA) 20 MG tablet Take 20 mg by mouth daily.    . DOCOSAHEXAENOIC ACID PO Take 1 tablet by mouth 2 (two) times daily.    Marland Kitchen gabapentin (NEURONTIN) 300 MG capsule Take 300 mg by mouth 3 (three) times daily.    . magnesium oxide (MAG-OX) 400 MG tablet Take 800 mg by mouth 2 (two) times daily.    . metFORMIN (GLUCOPHAGE) 1000 MG tablet Take 1,000 mg by mouth 2 (two) times daily.    Marland Kitchen oxycodone (OXY-IR) 5 MG capsule Take 5 mg by mouth every 6 (six) hours as needed.     . warfarin (COUMADIN) 2.5 MG tablet Take 3 mg by mouth daily.     No current facility-administered medications for this visit.       Marland Kitchen  PHYSICAL EXAMINATION: ECOG PERFORMANCE STATUS: 3 - Symptomatic, >50% confined to bed Vitals:   06/02/17 0930  BP: (!) 167/86  Pulse: 60  Resp: 17  Temp: (!) 96.7 F (35.9 C)   Filed Weights   06/02/17 0930  Weight: 184 lb 8.4 oz (83.7 kg)   ECOG 2  GENERAL: Alert, no distress and comfortable.  EYES: no pallor or icterus OROPHARYNX: no thrush or ulceration; good dentition  NECK: supple, no masses felt LYMPH:  no palpable lymphadenopathy in the cervical, axillary or inguinal regions LUNGS: clear to auscultation and  No wheeze or crackles HEART/CVS: regular rate & rhythm and no murmurs; No lower extremity edema ABDOMEN: abdomen soft, non-tender and normal bowel sounds Musculoskeletal:no cyanosis of digits and no clubbing  PSYCH: alert & oriented x 3  NEURO: left side weakness, chronic. Can  not raise left upper extremity due to recent trauma.  SKIN:  no rashes or significant lesions  LABORATORY DATA:  I have reviewed the data as listed Lab Results  Component Value Date   WBC 6.8 06/02/2017   HGB 15.1 06/02/2017   HCT 45.1 06/02/2017   MCV 84.1 06/02/2017   PLT 220 06/02/2017    Recent Labs  06/02/17 0952  NA 141  K 4.3  CL 104  CO2 28  GLUCOSE 106*  BUN 11  CREATININE 0.82  CALCIUM 9.8  GFRNONAA >60  GFRAA >60  PROT 8.4*  ALBUMIN 4.2  AST 19  ALT 13*  ALKPHOS 84  BILITOT 0.6    RADIOGRAPHIC STUDIES: I have personally reviewed the radiological images as listed and agreed with the findings in the report. 05/11/2017 CT left upper extremity showed - Nondisplaced pathologic fracture of the proximal left humerus.- Nondisplaced fractures of the posterior 4th-6th ribs.-  Soft tissue lesions within the proximal humerus, concerning for metastatic disease. 05/11/2017 CT chest showed  Acute, minimally displaced left posterior 4-6 rib fractures at the costovertebral junction.-- Transverse fracture of the left proximal humerus.-- No lung consolidation.-- Partially imaged severe left hydronephrosis and cortical atrophy, as seen on prior examinations.   ASSESSMENT & PLAN:  1. Humerus lesion, left   2. Malignant neoplasm of colon, unspecified part of colon ([place])   3. Pancreatic lesion   4. Chronic atrial fibrillation (HCC)   5. Cardiac pacemaker in situ   6. Chronic anticoagulation     We will need to obtain medical records from [facility] for his history of colon cancer. We'll check baseline cbc w diff, cmp, CEA, PSA, CA19.9.  Patient tells me that he may not want aggressive treatment but still want to have work up and have the information of what to anticipate. I think it is reasonable to proceed with work up and present him with treatment options.  I will start with PET scan and decide where and how to biopsy if he desires.   All questions were answered.  The patient knows to call the clinic with any problems questions or concerns.  Return of visit: follow up after PET scan.  Thank you for this kind referral and the opportunity to participate in the care of this patient. A copy of today's note is routed to referring provider    Earlie Server, MD, PhD Hematology Oncology [facility] at [facility] Pager- [phone] 06/02/2017

## 2017-06-03 LAB — CEA: CEA1: 2.2 ng/mL (ref 0.0–4.7)

## 2017-06-03 LAB — CANCER ANTIGEN 19-9: CAN 19-9: 10 U/mL (ref 0–35)

## 2017-06-07 ENCOUNTER — Ambulatory Visit
Admission: RE | Admit: 2017-06-07 | Discharge: 2017-06-07 | Disposition: A | Discharge status: Home / Self Care | Payer: Medicare Other | Source: Ambulatory Visit | Attending: Oncology | Admitting: Oncology

## 2017-06-07 DIAGNOSIS — N1330 Unspecified hydronephrosis: Secondary | ICD-10-CM | POA: Diagnosis not present

## 2017-06-07 DIAGNOSIS — R9082 White matter disease, unspecified: Secondary | ICD-10-CM | POA: Insufficient documentation

## 2017-06-07 DIAGNOSIS — I700 Atherosclerosis of aorta: Secondary | ICD-10-CM | POA: Diagnosis not present

## 2017-06-07 DIAGNOSIS — J8410 Pulmonary fibrosis, unspecified: Secondary | ICD-10-CM | POA: Insufficient documentation

## 2017-06-07 DIAGNOSIS — K869 Disease of pancreas, unspecified: Secondary | ICD-10-CM | POA: Diagnosis not present

## 2017-06-07 DIAGNOSIS — Z9889 Other specified postprocedural states: Secondary | ICD-10-CM | POA: Insufficient documentation

## 2017-06-07 DIAGNOSIS — N261 Atrophy of kidney (terminal): Secondary | ICD-10-CM | POA: Diagnosis not present

## 2017-06-07 DIAGNOSIS — M1288 Other specific arthropathies, not elsewhere classified, other specified site: Secondary | ICD-10-CM | POA: Diagnosis not present

## 2017-06-07 DIAGNOSIS — N134 Hydroureter: Secondary | ICD-10-CM | POA: Diagnosis not present

## 2017-06-07 DIAGNOSIS — M899 Disorder of bone, unspecified: Secondary | ICD-10-CM | POA: Insufficient documentation

## 2017-06-07 DIAGNOSIS — I2510 Atherosclerotic heart disease of native coronary artery without angina pectoris: Secondary | ICD-10-CM | POA: Diagnosis not present

## 2017-06-07 DIAGNOSIS — C189 Malignant neoplasm of colon, unspecified: Secondary | ICD-10-CM | POA: Insufficient documentation

## 2017-06-07 DIAGNOSIS — D110 Benign neoplasm of parotid gland: Secondary | ICD-10-CM | POA: Diagnosis not present

## 2017-06-07 DIAGNOSIS — S2249XD Multiple fractures of ribs, unspecified side, subsequent encounter for fracture with routine healing: Secondary | ICD-10-CM | POA: Diagnosis not present

## 2017-06-07 LAB — GLUCOSE, CAPILLARY: GLUCOSE-CAPILLARY: 120 mg/dL — AB (ref 65–99)

## 2017-06-07 MED ORDER — FLUDEOXYGLUCOSE F - 18 (FDG) INJECTION
13.0600 | Freq: Once | INTRAVENOUS | Status: AC | PRN
  Administered 2017-06-07: 13.06 via INTRAVENOUS

## 2017-06-09 ENCOUNTER — Other Ambulatory Visit: Payer: Medicare Other

## 2017-06-13 ENCOUNTER — Telehealth: Payer: Self-pay | Admitting: Oncology

## 2017-06-13 ENCOUNTER — Inpatient Hospital Stay (HOSPITAL_BASED_OUTPATIENT_CLINIC_OR_DEPARTMENT_OTHER): Payer: Medicare Other | Admitting: Oncology

## 2017-06-13 ENCOUNTER — Encounter: Payer: Self-pay | Admitting: Oncology

## 2017-06-13 VITALS — BP 165/85 | HR 60 | Temp 97.1°F | Wt 183.3 lb

## 2017-06-13 DIAGNOSIS — I2510 Atherosclerotic heart disease of native coronary artery without angina pectoris: Secondary | ICD-10-CM

## 2017-06-13 DIAGNOSIS — J8410 Pulmonary fibrosis, unspecified: Secondary | ICD-10-CM | POA: Diagnosis not present

## 2017-06-13 DIAGNOSIS — I700 Atherosclerosis of aorta: Secondary | ICD-10-CM | POA: Diagnosis not present

## 2017-06-13 DIAGNOSIS — C189 Malignant neoplasm of colon, unspecified: Secondary | ICD-10-CM

## 2017-06-13 DIAGNOSIS — Z9221 Personal history of antineoplastic chemotherapy: Secondary | ICD-10-CM

## 2017-06-13 DIAGNOSIS — M899 Disorder of bone, unspecified: Secondary | ICD-10-CM | POA: Diagnosis not present

## 2017-06-13 DIAGNOSIS — I252 Old myocardial infarction: Secondary | ICD-10-CM

## 2017-06-13 DIAGNOSIS — E1122 Type 2 diabetes mellitus with diabetic chronic kidney disease: Secondary | ICD-10-CM

## 2017-06-13 DIAGNOSIS — R221 Localized swelling, mass and lump, neck: Secondary | ICD-10-CM | POA: Diagnosis not present

## 2017-06-13 DIAGNOSIS — Z7982 Long term (current) use of aspirin: Secondary | ICD-10-CM

## 2017-06-13 DIAGNOSIS — Z8781 Personal history of (healed) traumatic fracture: Secondary | ICD-10-CM

## 2017-06-13 DIAGNOSIS — K219 Gastro-esophageal reflux disease without esophagitis: Secondary | ICD-10-CM

## 2017-06-13 DIAGNOSIS — Z950 Presence of cardiac pacemaker: Secondary | ICD-10-CM

## 2017-06-13 DIAGNOSIS — Z7984 Long term (current) use of oral hypoglycemic drugs: Secondary | ICD-10-CM

## 2017-06-13 DIAGNOSIS — N1330 Unspecified hydronephrosis: Secondary | ICD-10-CM

## 2017-06-13 DIAGNOSIS — Z923 Personal history of irradiation: Secondary | ICD-10-CM

## 2017-06-13 DIAGNOSIS — J449 Chronic obstructive pulmonary disease, unspecified: Secondary | ICD-10-CM

## 2017-06-13 DIAGNOSIS — Z7901 Long term (current) use of anticoagulants: Secondary | ICD-10-CM

## 2017-06-13 DIAGNOSIS — Z8673 Personal history of transient ischemic attack (TIA), and cerebral infarction without residual deficits: Secondary | ICD-10-CM

## 2017-06-13 DIAGNOSIS — I482 Chronic atrial fibrillation: Secondary | ICD-10-CM

## 2017-06-13 DIAGNOSIS — Z87891 Personal history of nicotine dependence: Secondary | ICD-10-CM

## 2017-06-13 DIAGNOSIS — K869 Disease of pancreas, unspecified: Secondary | ICD-10-CM

## 2017-06-13 DIAGNOSIS — I517 Cardiomegaly: Secondary | ICD-10-CM

## 2017-06-13 NOTE — Progress Notes (Signed)
Hematology/Oncology Consult note [facility] Telephone:([phone] [phone]  CONSULT NOTE Patient Care Team: Clarisse Gouge, MD as PCP - General (Family Medicine)  CHIEF COMPLAINTS/PURPOSE OF CONSULTATION:  I was told that I have bone cancer.   HISTORY OF PRESENTING ILLNESS:  Michael Bush 76 y.o.  male with past medical history listed as below was referred by Dr. Prescott Parma for evaluation of his recent pathological fracture/lesion of left humerus. Patient is accompanied by his wife and daughter.  Patient had a fall on 05/01/2017 went to emergency room. X-ray initially showed arthritis. Patient continued to have pain and had a CT scan of his left humerus with a chest. CT left upper extremity showed - Nondisplaced pathologic fracture of the proximal left humerus.- Nondisplaced fractures of the posterior 4th-6th ribs.- Soft tissue lesions within the proximal humerus, concerning for metastatic disease.CT chest showed  Acute, minimally displaced left posterior 4-6 rib fractures at the costovertebral junction.-- Transverse fracture of the left proximal humerus.-- No lung consolidation.-- Partially imaged severe left hydronephrosis and cortical atrophy, as seen on prior examinations. Patient has been seen by orthopedic surgeon Dr. Prescott Parma and referred to see me for additional workup. With further questioning, patient reports that he has a history of colon cancer which was diagnosed back in 2003 at [facility]. Patient and family were told that he has stage IV colon cancer. Patient reports that he has received surgery, chemotherapy and radiation therapy, he cannot remember details about the chemotherapy regimen. 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 Maudie Mercury at [facility]. Per care everywhere records, surgical options including pancreaticoduodenectomy for resection. Patient did not follow-up. Patient tells me that after  hearing the extent of surgery, he decided not to have additional surgical workup.  Patient reports feeling fatigued, lack of appetite, an intentional weight loss recently. He lives with wife  Who is his caregiver. Due to the residual neurologic deficit including left-sided weakness, he cannot walk independently.  INTERVAL HISTORY Patient presents to discuss about the results. No new complaints.    ROS:  Review of Systems  Constitutional: Positive for fatigue and unexpected weight change.  HENT:  Negative.   Eyes: Negative.   Respiratory: Negative.   Cardiovascular: Negative.   Gastrointestinal: Positive for diarrhea.  Endocrine: Negative.   Genitourinary: Negative.    Musculoskeletal: Positive for gait problem.       Left arm pain.   Neurological: Positive for gait problem.       Left side weakness, chronic, residue deficit from stroke.   Hematological: Negative.   Psychiatric/Behavioral: Negative.     MEDICAL HISTORY:  Past Medical History:  Diagnosis Date  . Cancer ([place])   . Chronic kidney disease   . COPD (chronic obstructive pulmonary disease) ([place])   . Diabetes mellitus without complication ([place])   . GERD (gastroesophageal reflux disease)   . Myocardial infarction ([place])   . Stroke [facility])     SURGICAL HISTORY: Past Surgical History:  Procedure Laterality Date  . COLON SURGERY      SOCIAL HISTORY: Social History   Social History  . Marital status: Married    Spouse name: N/A  . Number of children: N/A  . Years of education: N/A   Occupational History  . Not on file.   Social History Main Topics  . Smoking status: Former Smoker    Types: Cigarettes  . Smokeless tobacco: Never Used  . Alcohol use No  .  Drug use: No  . Sexual activity: Not Currently   Other Topics Concern  . Not on file   Social History Narrative  . No narrative on file    FAMILY HISTORY: Family History  Problem Relation Age of Onset  . Cancer Father     ALLERGIES:  is  allergic to vancomycin.  MEDICATIONS:  Current Outpatient Prescriptions  Medication Sig Dispense Refill  . acetaminophen (TYLENOL) 325 MG tablet Take by mouth.    Marland Kitchen aspirin 81 MG chewable tablet Chew 81 mg by mouth every morning.    Marland Kitchen atorvastatin (LIPITOR) 10 MG tablet 10 mg at bedtime.    . B Complex Vitamins (VITAMIN B COMPLEX PO) Take 2 tablets by mouth daily.    . Calcium Carb-Ergocalciferol 500-200 MG-UNIT TABS Take 2 tablets by mouth daily.    . citalopram (CELEXA) 20 MG tablet Take 20 mg by mouth daily.    . DOCOSAHEXAENOIC ACID PO Take 1 tablet by mouth 2 (two) times daily.    Marland Kitchen gabapentin (NEURONTIN) 300 MG capsule Take 300 mg by mouth 3 (three) times daily.    . magnesium oxide (MAG-OX) 400 MG tablet Take 800 mg by mouth 2 (two) times daily.    . metFORMIN (GLUCOPHAGE) 1000 MG tablet Take 1,000 mg by mouth 2 (two) times daily.    Marland Kitchen oxycodone (OXY-IR) 5 MG capsule Take 5 mg by mouth every 6 (six) hours as needed.     . warfarin (COUMADIN) 2.5 MG tablet Take 3 mg by mouth daily.     No current facility-administered medications for this visit.       Marland Kitchen  PHYSICAL EXAMINATION: ECOG PERFORMANCE STATUS: 3 - Symptomatic, >50% confined to bed There were no vitals filed for this visit. There were no vitals filed for this visit. ECOG 2  GENERAL: Alert, no distress and comfortable.  EYES: no pallor or icterus OROPHARYNX: no thrush or ulceration; good dentition  NECK: supple, no masses felt LYMPH:  no palpable lymphadenopathy in the cervical, axillary or inguinal regions LUNGS: clear to auscultation and  No wheeze or crackles HEART/CVS: regular rate & rhythm and no murmurs; No lower extremity edema ABDOMEN: abdomen soft, non-tender and normal bowel sounds Musculoskeletal:no cyanosis of digits and no clubbing  PSYCH: alert & oriented x 3  NEURO: left side weakness, chronic. Can not raise left upper extremity due to recent trauma.  SKIN:  no rashes or significant  lesions  LABORATORY DATA:  I have reviewed the data as listed Lab Results  Component Value Date   WBC 6.8 06/02/2017   HGB 15.1 06/02/2017   HCT 45.1 06/02/2017   MCV 84.1 06/02/2017   PLT 220 06/02/2017    Recent Labs  06/02/17 0952  NA 141  K 4.3  CL 104  CO2 28  GLUCOSE 106*  BUN 11  CREATININE 0.82  CALCIUM 9.8  GFRNONAA >60  GFRAA >60  PROT 8.4*  ALBUMIN 4.2  AST 19  ALT 13*  ALKPHOS 84  BILITOT 0.6    RADIOGRAPHIC STUDIES: I have personally reviewed the radiological images as listed and agreed with the findings in the report. 05/11/2017 CT left upper extremity showed - Nondisplaced pathologic fracture of the proximal left humerus.- Nondisplaced fractures of the posterior 4th-6th ribs.- Soft tissue lesions within the proximal humerus, concerning for metastatic disease. 05/11/2017 CT chest showed  Acute, minimally displaced left posterior 4-6 rib fractures at the costovertebral junction.-- Transverse fracture of the left proximal humerus.-- No lung consolidation.-- Partially imaged  severe left hydronephrosis and cortical atrophy, as seen on prior examinations.   PET scan 06/07/2017 IMPRESSION: 1. Moderately hypermetabolic lesions in both parotid glands. Possibilities include benign lesions such as Warthin's tumor, or malignant deep parotid lymph nodes. The lymph nodes are not overtly enlarged. 2. No hypermetabolic lung lesion. There is bilateral airway thickening with some airway plugging in the right lower lobe, and several calcified granulomas in the left lung. If there is an outside study that suggests the presence of a lung mass, we are happy to compare to the outside exam if they can be provided. 3. Deformity left proximal humerus which may be from severe degenerative arthropathy or a subtle fracture. Fracture not well seen on today' s exam, the CT data is for PET correlation an localization, and not for diagnostic musculoskeletal fracture assessment. No obvious  hypermetabolic activity in the proximal third of the humerus on the left. 4. Healing left rib fractures. 5. 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 6. Aortic Atherosclerosis (ICD10-I70.0). Coronary atherosclerosis with mild cardiomegaly. 7. Severe bilateral degenerative glenohumeral arthropathy. 8. Severe left renal atrophy with prominent left hydronephrosis and hydroureter extending down to the iliac vessel cross over. 9. Postoperative findings in the rectum. Presacral soft tissue density is not hypermetabolic and likely treatment related.   ASSESSMENT & PLAN:  1. Humerus lesion, left   2. Malignant neoplasm of colon, unspecified part of colon ([place])   3. Pancreatic lesion   4. Chronic anticoagulation     We have note received [place] for his history of colon cancer. Normal CEA, PSA, CA19.9.  Patient tells me that he may not want aggressive treatment but still want to have work up and have the information of what to anticipate. PET scan were reviewed with patient. No definitive hypermetabolic correlate with his proximal humerus lesion. His pancreatic lesion did not light up as well. Incidental finding of bilateral parotid gland low grade hypermetabolic activity. Discussed with patient that no hypermetabolic can not rule out low grade cancer but watchful waiting can be considered. Can consider a repeat of CT of left upper extremity in 3 months and compare if there is any growth  Patient would like to know what is causing the hypermetabolic activity of bilateral parotid gland. Will refer to ENT surgeon.   All questions were answered. The patient knows to call the clinic with any problems questions or concerns.  Return of visit: 4 months.  Thank you for this kind referral and the opportunity to participate in the care of this patient. A copy of today's note is routed to referring provider    Earlie Server, MD, PhD Hematology Oncology [facility] at  [facility] Pager- [phone] 06/13/2017

## 2017-06-13 NOTE — Progress Notes (Signed)
Patient here today for follow up.  Patient states no new concerns today  

## 2017-06-13 NOTE — Telephone Encounter (Signed)
Per Lenna Sciara at Dr Ileene Hutchinson office, Please Fax Patient's Medical Records, Demographics and Diagnosis to [phone] before an appointment can be scheduled. Tel [phone]  Msg sent to Dr Deborha Payment

## 2017-06-20 NOTE — Telephone Encounter (Signed)
Requested documentation sent to [facility]NT.  Appt scheduled for 07/01/17 @ 3:00 with Dr Tami Ribas

## 2017-07-04 ENCOUNTER — Other Ambulatory Visit: Payer: Self-pay | Admitting: Unknown Physician Specialty

## 2017-07-05 ENCOUNTER — Other Ambulatory Visit: Payer: Self-pay | Admitting: Unknown Physician Specialty

## 2017-07-05 DIAGNOSIS — R221 Localized swelling, mass and lump, neck: Secondary | ICD-10-CM

## 2017-07-08 ENCOUNTER — Other Ambulatory Visit: Payer: Self-pay | Admitting: Physician Assistant

## 2017-07-11 ENCOUNTER — Ambulatory Visit
Admission: RE | Admit: 2017-07-11 | Discharge: 2017-07-11 | Disposition: A | Discharge status: Home / Self Care | Payer: Medicare Other | Source: Ambulatory Visit | Attending: Unknown Physician Specialty | Admitting: Unknown Physician Specialty

## 2017-07-11 DIAGNOSIS — Z87891 Personal history of nicotine dependence: Secondary | ICD-10-CM | POA: Diagnosis not present

## 2017-07-11 DIAGNOSIS — Z7901 Long term (current) use of anticoagulants: Secondary | ICD-10-CM | POA: Diagnosis not present

## 2017-07-11 DIAGNOSIS — Z7982 Long term (current) use of aspirin: Secondary | ICD-10-CM | POA: Insufficient documentation

## 2017-07-11 DIAGNOSIS — N189 Chronic kidney disease, unspecified: Secondary | ICD-10-CM | POA: Insufficient documentation

## 2017-07-11 DIAGNOSIS — I129 Hypertensive chronic kidney disease with stage 1 through stage 4 chronic kidney disease, or unspecified chronic kidney disease: Secondary | ICD-10-CM | POA: Diagnosis not present

## 2017-07-11 DIAGNOSIS — I252 Old myocardial infarction: Secondary | ICD-10-CM | POA: Insufficient documentation

## 2017-07-11 DIAGNOSIS — E1122 Type 2 diabetes mellitus with diabetic chronic kidney disease: Secondary | ICD-10-CM | POA: Insufficient documentation

## 2017-07-11 DIAGNOSIS — Z79899 Other long term (current) drug therapy: Secondary | ICD-10-CM | POA: Insufficient documentation

## 2017-07-11 DIAGNOSIS — R221 Localized swelling, mass and lump, neck: Secondary | ICD-10-CM | POA: Diagnosis present

## 2017-07-11 DIAGNOSIS — D490 Neoplasm of unspecified behavior of digestive system: Secondary | ICD-10-CM | POA: Diagnosis not present

## 2017-07-11 DIAGNOSIS — E785 Hyperlipidemia, unspecified: Secondary | ICD-10-CM | POA: Diagnosis not present

## 2017-07-11 DIAGNOSIS — J449 Chronic obstructive pulmonary disease, unspecified: Secondary | ICD-10-CM | POA: Insufficient documentation

## 2017-07-11 DIAGNOSIS — K219 Gastro-esophageal reflux disease without esophagitis: Secondary | ICD-10-CM | POA: Diagnosis not present

## 2017-07-11 DIAGNOSIS — Z950 Presence of cardiac pacemaker: Secondary | ICD-10-CM | POA: Diagnosis not present

## 2017-07-11 DIAGNOSIS — Z7984 Long term (current) use of oral hypoglycemic drugs: Secondary | ICD-10-CM | POA: Diagnosis not present

## 2017-07-11 LAB — CBC
HCT: 47.3 % (ref 40.0–52.0)
HEMOGLOBIN: 15.6 g/dL (ref 13.0–18.0)
MCH: 27.9 pg (ref 26.0–34.0)
MCHC: 32.9 g/dL (ref 32.0–36.0)
MCV: 84.8 fL (ref 80.0–100.0)
Platelets: 229 10*3/uL (ref 150–440)
RBC: 5.58 MIL/uL (ref 4.40–5.90)
RDW: 15 % — AB (ref 11.5–14.5)
WBC: 6.5 10*3/uL (ref 3.8–10.6)

## 2017-07-11 LAB — APTT: APTT: 29 s (ref 24–36)

## 2017-07-11 LAB — PROTIME-INR
INR: 1.2
PROTHROMBIN TIME: 15.1 s (ref 11.4–15.2)

## 2017-07-11 MED ORDER — SODIUM CHLORIDE 0.9 % IV SOLN: INTRAVENOUS | Status: DC

## 2017-07-11 NOTE — Procedures (Signed)
Left parotid FNA without difficulty  Complications:  None  Blood Loss: none  See dictation in canopy pacs

## 2017-07-12 LAB — CYTOLOGY - NON PAP

## 2017-07-15 ENCOUNTER — Other Ambulatory Visit: Payer: Self-pay | Admitting: Unknown Physician Specialty

## 2017-07-15 DIAGNOSIS — D110 Benign neoplasm of parotid gland: Secondary | ICD-10-CM

## 2017-07-27 DIAGNOSIS — H532 Diplopia: Secondary | ICD-10-CM | POA: Insufficient documentation

## 2017-07-27 DIAGNOSIS — H5005 Alternating esotropia: Secondary | ICD-10-CM | POA: Insufficient documentation

## 2017-07-27 DIAGNOSIS — H4921 Sixth [abducent] nerve palsy, right eye: Secondary | ICD-10-CM | POA: Insufficient documentation

## 2017-10-12 NOTE — Progress Notes (Signed)
Hematology/Oncology Follow up note [facility] [phone] [phone]  CONSULT NOTE Patient Care Team: Clarisse Gouge, MD as PCP - General (Family Medicine)  REASON FOR VISIT Follow up for history of colon cancer, pancreatic lesion and left upper extremity lesion.   HISTORY OF PRESENTING ILLNESS:  Michael Bush 76 y.o.  male with past medical history listed as below was referred by Dr. Prescott Parma for evaluation of his recent pathological fracture/lesion of left humerus. Patient is accompanied by his wife and daughter.  Patient had a fall on 05/01/2017 went to emergency room. X-ray initially showed arthritis. Patient continued to have pain and had a CT scan of his left humerus with a chest. CT left upper extremity showed - Nondisplaced pathologic fracture of the proximal left humerus.- Nondisplaced fractures of the posterior 4th-6th ribs.- Soft tissue lesions within the proximal humerus, concerning for metastatic disease.CT chest showed  Acute, minimally displaced left posterior 4-6 rib fractures at the costovertebral junction.-- Transverse fracture of the left proximal humerus.-- No lung consolidation.-- Partially imaged severe left hydronephrosis and cortical atrophy, as seen on prior examinations. Patient has been seen by orthopedic surgeon Dr. Prescott Parma and referred to see me for additional workup. With further questioning, patient reports that he has a history of colon cancer which was diagnosed back in 2003 at [facility]. Patient and family were told that he has stage IV colon cancer. Patient reports that he has received surgery, chemotherapy and radiation therapy, he cannot remember details about the chemotherapy regimen. 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 Maudie Mercury at [facility]. Per care everywhere records, surgical options including pancreaticoduodenectomy for resection. Patient did not follow-up.  Patient tells me that after hearing the extent of surgery, he decided not to have additional surgical workup.  Patient reports feeling fatigued, lack of appetite, an intentional weight loss recently. He lives with wife  Who is his caregiver. Due to the residual neurologic deficit including left-sided weakness, he cannot walk independently.  INTERVAL HISTORY Patient presents to follow up. 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 Today he reports feeling well at baseline. He has ongoing right shoulder arthritis pain. Left upper extremity and no pain. Has good appetite. Denies B symptoms. He is undergoing workup with her primary care physician for diarrhea.  ROS:  Review of Systems  Constitutional: Positive for fatigue and unexpected weight change.  HENT:  Negative.   Eyes: Negative.   Respiratory: Negative.  Negative for cough.   Cardiovascular: Negative.   Gastrointestinal: Positive for diarrhea.  Endocrine: Negative for hot flashes.  Genitourinary: Negative for dysuria.   Musculoskeletal: Positive for gait problem.       Left arm pain.   Neurological: Positive for gait problem.       Left side weakness, chronic, residue deficit from stroke.   Hematological: Negative.   Psychiatric/Behavioral: Negative.     MEDICAL HISTORY:  Past Medical History:  Diagnosis Date  . Cancer ([place])   . Chronic kidney disease   . COPD (chronic obstructive pulmonary disease) ([place])   . Diabetes mellitus without complication ([place])   . GERD (gastroesophageal reflux disease)   . Myocardial infarction ([place])   . Stroke [facility])     SURGICAL HISTORY: Past Surgical History:  Procedure Laterality Date  . COLON SURGERY      SOCIAL HISTORY: Social History  Socioeconomic History  . Marital status: Married    Spouse name: Not on file  . Number of children: Not on file  . Years of education: Not on file  . Highest education  level: Not on file  Social Needs  . Financial resource strain: Not on file  . Food insecurity - worry: Not on file  . Food insecurity - inability: Not on file  . Transportation needs - medical: Not on file  . Transportation needs - non-medical: Not on file  Occupational History  . Not on file  Tobacco Use  . Smoking status: Former Smoker    Types: Cigarettes  . Smokeless tobacco: Never Used  Substance and Sexual Activity  . Alcohol use: No  . Drug use: No  . Sexual activity: Not Currently  Other Topics Concern  . Not on file  Social History Narrative  . Not on file    FAMILY HISTORY: Family History  Problem Relation Age of Onset  . Cancer Father     ALLERGIES:  is allergic to vancomycin.  MEDICATIONS:  Current Outpatient Medications  Medication Sig Dispense Refill  . acetaminophen (TYLENOL) 325 MG tablet Take by mouth.    Marland Kitchen aspirin 81 MG chewable tablet Chew 81 mg by mouth every morning.    Marland Kitchen atorvastatin (LIPITOR) 10 MG tablet 10 mg at bedtime.    . B Complex Vitamins (VITAMIN B COMPLEX PO) Take 2 tablets by mouth daily.    . Calcium Carb-Ergocalciferol 500-200 MG-UNIT TABS Take 2 tablets by mouth daily.    . citalopram (CELEXA) 20 MG tablet Take 20 mg by mouth daily.    Marland Kitchen gabapentin (NEURONTIN) 300 MG capsule Take 300 mg by mouth 3 (three) times daily.    . magnesium oxide (MAG-OX) 400 MG tablet Take 800 mg by mouth 2 (two) times daily.    . metFORMIN (GLUCOPHAGE) 1000 MG tablet Take 1,000 mg by mouth 2 (two) times daily.    Marland Kitchen warfarin (COUMADIN) 2.5 MG tablet Take 3 mg by mouth daily.     No current facility-administered medications for this visit.       Marland Kitchen  PHYSICAL EXAMINATION: ECOG PERFORMANCE STATUS: 3 - Symptomatic, >50% confined to bed Vitals:   10/13/17 1357  BP: (!) 151/77  Pulse: 60  Temp: 98.4 F (36.9 C)   Filed Weights   10/13/17 1357  Weight: 186 lb 3 oz (84.5 kg)   ECOG 2  GENERAL: No distress,  SKIN:  No rashes or significant lesions    HEAD: Normocephalic, No masses, lesions, tenderness or abnormalities  EYES: Conjunctiva are pink, non icteric ENT: oral  mucous membranes are moist  LYMPH: No palpable cervical and axillary lymphadenopathy  LUNGS: Clear to auscultation, no crackles or wheezes HEART: Regular rate & rhythm, no murmurs, no gallops, S1 normal and S2 normal  ABDOMEN: Abdomen soft, non-tender, normal bowel sounds, I did not appreciate any  masses or organomegaly  MUSCULOSKELETAL: No CVA tenderness and no tenderness on percussion of the back or rib cage.  EXTREMITIES: No edema, no skin discoloration or tenderness NEURO: Alert & oriented, left side weakness, chronic.   LABORATORY DATA:  I have reviewed the data as listed Lab Results  Component Value Date   WBC 6.5 07/11/2017   HGB 15.6 07/11/2017   HCT 47.3 07/11/2017   MCV 84.8 07/11/2017   PLT 229 07/11/2017   Recent Labs    06/02/17 0952  NA 141  K 4.3  CL 104  CO2 28  GLUCOSE 106*  BUN 11  CREATININE 0.82  CALCIUM 9.8  GFRNONAA >60  GFRAA >60  PROT 8.4*  ALBUMIN 4.2  AST 19  ALT 13*  ALKPHOS 84  BILITOT 0.6    RADIOGRAPHIC STUDIES: I have personally reviewed the radiological images as listed and agreed with the findings in the report. 05/11/2017 CT left upper extremity showed - Nondisplaced pathologic fracture of the proximal left humerus.- Nondisplaced fractures of the posterior 4th-6th ribs.- Soft tissue lesions within the proximal humerus, concerning for metastatic disease. 05/11/2017 CT chest showed  Acute, minimally displaced left posterior 4-6 rib fractures at the costovertebral junction.-- Transverse fracture of the left proximal humerus.-- No lung consolidation.-- Partially imaged severe left hydronephrosis and cortical atrophy, as seen on prior examinations.   PET scan 06/07/2017 IMPRESSION: 1. Moderately hypermetabolic lesions in both parotid glands. Possibilities include benign lesions such as Warthin's tumor, or malignant deep  parotid lymph nodes. The lymph nodes are not overtly enlarged. 2. No hypermetabolic lung lesion. There is bilateral airway thickening with some airway plugging in the right lower lobe, and several calcified granulomas in the left lung. If there is an outside study that suggests the presence of a lung mass, we are happy to compare to the outside exam if they can be provided. 3. Deformity left proximal humerus which may be from severe degenerative arthropathy or a subtle fracture. Fracture not well seen on today' s exam, the CT data is for PET correlation an localization, and not for diagnostic musculoskeletal fracture assessment. No obvious hypermetabolic activity in the proximal third of the humerus on the left. 4. Healing left rib fractures. 5. 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 6. Aortic Atherosclerosis (ICD10-I70.0). Coronary atherosclerosis with mild cardiomegaly. 7. Severe bilateral degenerative glenohumeral arthropathy. 8. Severe left renal atrophy with prominent left hydronephrosis and hydroureter extending down to the iliac vessel cross over. 9. Postoperative findings in the rectum. Presacral soft tissue density is not hypermetabolic and likely treatment related.  06/02/2017 Normal CEA, PSA, CA19.9.     ASSESSMENT & PLAN:  1. Humerus lesion, left   2. Malignant neoplasm of colon, unspecified part of colon ([place])   3. Pancreatic lesion   4. Chronic anticoagulation   5. Warthin tumor    Discussed with patient that we can repeat another CT of the left upper extremity and compare if there is any growth. As far as his chronic diarrhea, he should continue follow up with primary care physician for workup. If this is not due to  Infection, I suspect either he has chronic GI inflammation or malabsorption. And he should be effective by GI physician. # Warthin tumor: follow up with ENT. I agree that since this is a low-grade tumor, given his multiple  comorbidities, watchful waiting is reasonable. Surgical is indicated if rapid growth or symptomatic.  All questions were answered. The patient knows to call the clinic with any problems questions or concerns.  Return of visit 6 months.  Earlie Server, MD, PhD Hematology Oncology [facility] Pager- [phone] 10/13/2017

## 2017-10-13 ENCOUNTER — Ambulatory Visit (INDEPENDENT_AMBULATORY_CARE_PROVIDER_SITE_OTHER): Payer: Medicare Other | Admitting: Urology

## 2017-10-13 ENCOUNTER — Encounter: Payer: Self-pay | Admitting: Urology

## 2017-10-13 ENCOUNTER — Other Ambulatory Visit: Payer: Self-pay

## 2017-10-13 ENCOUNTER — Inpatient Hospital Stay: Payer: Medicare Other | Attending: Oncology | Admitting: Oncology

## 2017-10-13 ENCOUNTER — Encounter: Payer: Self-pay | Admitting: Oncology

## 2017-10-13 VITALS — BP 161/84 | HR 61 | Ht 70.0 in | Wt 186.0 lb

## 2017-10-13 VITALS — BP 151/77 | HR 60 | Temp 98.4°F | Wt 186.2 lb

## 2017-10-13 DIAGNOSIS — K219 Gastro-esophageal reflux disease without esophagitis: Secondary | ICD-10-CM | POA: Insufficient documentation

## 2017-10-13 DIAGNOSIS — D110 Benign neoplasm of parotid gland: Secondary | ICD-10-CM | POA: Diagnosis not present

## 2017-10-13 DIAGNOSIS — E1122 Type 2 diabetes mellitus with diabetic chronic kidney disease: Secondary | ICD-10-CM | POA: Insufficient documentation

## 2017-10-13 DIAGNOSIS — N1330 Unspecified hydronephrosis: Secondary | ICD-10-CM | POA: Diagnosis not present

## 2017-10-13 DIAGNOSIS — R531 Weakness: Secondary | ICD-10-CM | POA: Diagnosis not present

## 2017-10-13 DIAGNOSIS — M899 Disorder of bone, unspecified: Secondary | ICD-10-CM | POA: Diagnosis not present

## 2017-10-13 DIAGNOSIS — Z85038 Personal history of other malignant neoplasm of large intestine: Secondary | ICD-10-CM | POA: Diagnosis not present

## 2017-10-13 DIAGNOSIS — Z87442 Personal history of urinary calculi: Secondary | ICD-10-CM

## 2017-10-13 DIAGNOSIS — M84422D Pathological fracture, left humerus, subsequent encounter for fracture with routine healing: Secondary | ICD-10-CM | POA: Diagnosis not present

## 2017-10-13 DIAGNOSIS — Z7901 Long term (current) use of anticoagulants: Secondary | ICD-10-CM | POA: Diagnosis not present

## 2017-10-13 DIAGNOSIS — J449 Chronic obstructive pulmonary disease, unspecified: Secondary | ICD-10-CM | POA: Diagnosis not present

## 2017-10-13 DIAGNOSIS — D119 Benign neoplasm of major salivary gland, unspecified: Secondary | ICD-10-CM

## 2017-10-13 DIAGNOSIS — C189 Malignant neoplasm of colon, unspecified: Secondary | ICD-10-CM

## 2017-10-13 DIAGNOSIS — R29818 Other symptoms and signs involving the nervous system: Secondary | ICD-10-CM | POA: Insufficient documentation

## 2017-10-13 DIAGNOSIS — N189 Chronic kidney disease, unspecified: Secondary | ICD-10-CM | POA: Insufficient documentation

## 2017-10-13 DIAGNOSIS — K869 Disease of pancreas, unspecified: Secondary | ICD-10-CM

## 2017-10-13 NOTE — Progress Notes (Signed)
10/13/2017 3:24 PM   Michael Bush 1941/06/25 [id number]  Referring provider: Clarisse Gouge, MD [place], [place] [address]  Chief Complaint  Patient presents with  . Nephrolithiasis    [facility] transfer care   Urologic Problem List  1. Recurrent nephrolithiasis-with prior PCNL 2010 and ureteroscopic stone removal 2018  2.  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  I last saw him at [facility] in August 2018.  He states he has done well.  Earlier this month he had an episode of severe epigastric pain with vomiting which resolved.  He denies recurrent gross hematuria.   PMH: Past Medical History:  Diagnosis Date  . Cancer ([place])   . Chronic kidney disease   . COPD (chronic obstructive pulmonary disease) ([place])   . Diabetes mellitus without complication ([place])   . GERD (gastroesophageal reflux disease)   . Myocardial infarction ([place])   . Stroke [facility])     Surgical History: Past Surgical History:  Procedure Laterality Date  . COLON SURGERY      Home Medications:  Allergies as of 10/13/2017      Reactions   Vancomycin Swelling   Facial swelling      Medication List        Accurate as of 10/13/17  3:24 PM. Always use your most recent med list.          acetaminophen 325 MG tablet Commonly known as:  TYLENOL Take by mouth.   aspirin 81 MG chewable tablet Chew 81 mg by mouth every morning.   atorvastatin 10 MG tablet Commonly known as:  LIPITOR 10 mg at bedtime.   Calcium Carb-Ergocalciferol 500-200 MG-UNIT Tabs Take 2 tablets by mouth daily.   citalopram 20 MG tablet Commonly known as:  CELEXA Take 20 mg by mouth daily.   gabapentin 300 MG capsule Commonly known as:  NEURONTIN Take 300 mg by mouth 3 (three) times daily.   JANUVIA 50 MG tablet Generic drug:  sitaGLIPtin Take by mouth.   magnesium oxide 400 MG  tablet Commonly known as:  MAG-OX Take 800 mg by mouth 2 (two) times daily.   metFORMIN 1000 MG tablet Commonly known as:  GLUCOPHAGE Take 1,000 mg by mouth 2 (two) times daily.   VITAMIN B COMPLEX PO Take 2 tablets by mouth daily.   warfarin 2.5 MG tablet Commonly known as:  COUMADIN Take 3 mg by mouth daily.       Allergies:  Allergies  Allergen Reactions  . Vancomycin Swelling    Facial swelling    Family History: Family History  Problem Relation Age of Onset  . Cancer Father     Social History:  reports that he has quit smoking. His smoking use included cigarettes. he has never used smokeless tobacco. He reports that he does not drink alcohol or use drugs.  ROS: UROLOGY Frequent Urination?: Yes Hard to postpone urination?: No Burning/pain with urination?: No Get up at night to urinate?: Yes Leakage of urine?: No Urine stream starts and stops?: No Trouble starting stream?: No Do you have to strain to urinate?: No Blood in urine?: No Urinary tract infection?: No Sexually transmitted disease?: No Injury to kidneys or bladder?: No Painful intercourse?: No Weak stream?: No Erection problems?: No Penile pain?: No  Gastrointestinal Nausea?: No Vomiting?: No Indigestion/heartburn?: No Diarrhea?: Yes Constipation?: No  Constitutional Fever: No Night sweats?: No Weight loss?: No Fatigue?: No  Skin Skin rash/lesions?: No Itching?: No  Eyes Blurred vision?: Yes Double vision?: No  Ears/Nose/Throat Sore throat?: No Sinus problems?: Yes  Hematologic/Lymphatic Swollen glands?: No Easy bruising?: No  Cardiovascular Leg swelling?: No Chest pain?: No  Respiratory Cough?: No Shortness of breath?: No  Endocrine Excessive thirst?: No  Musculoskeletal Back pain?: Yes Joint pain?: Yes  Neurological Headaches?: No Dizziness?: Yes  Psychologic Depression?: No Anxiety?: No  Physical Exam: BP (!) 161/84   Pulse 61   Ht 5\' 10"  (1.778  m)   Wt 186 lb (84.4 kg)   BMI 26.69 kg/m   Constitutional:  Alert and oriented, No acute distress. HEENT: [place] AT, moist mucus membranes.  Trachea midline, no masses. Cardiovascular: No clubbing, cyanosis, or edema. Respiratory: Normal respiratory effort, no increased work of breathing. GI: Abdomen is soft, nontender, nondistended, no abdominal masses GU: No CVA tenderness. Skin: No rashes, bruises or suspicious lesions. Lymph: No cervical or inguinal adenopathy. Neurologic: Grossly intact, no focal deficits, moving all 4 extremities. Psychiatric: Normal mood and affect.  Laboratory Data: Lab Results  Component Value Date   WBC 6.5 07/11/2017   HGB 15.6 07/11/2017   HCT 47.3 07/11/2017   MCV 84.8 07/11/2017   PLT 229 07/11/2017    Lab Results  Component Value Date   CREATININE 0.82 06/02/2017     Assessment & Plan:   His recent epigastric pain sounds more GI related.  I have recommended obtaining a renal ultrasound and KUB.  He will be notified with results and if no significant abnormalities recommend a 80-month follow-up.  1. Personal history of kidney stones  - Ultrasound renal complete; Future - Abdomen 1 view (KUB); Future  2. Hydronephrosis, unspecified hydronephrosis type  - Ultrasound renal complete; Future - Abdomen 1 view (KUB); Future    Abbie Sons, [place] [address] [place], [place] [address] [phone]

## 2017-10-13 NOTE — Progress Notes (Signed)
Patient here today for follow up.  Patient states no new concerns today  

## 2017-10-21 ENCOUNTER — Ambulatory Visit: Payer: Medicare Other

## 2017-10-24 ENCOUNTER — Ambulatory Visit
Admission: RE | Admit: 2017-10-24 | Discharge: 2017-10-24 | Disposition: A | Discharge status: Home / Self Care | Payer: Medicare Other | Source: Ambulatory Visit | Attending: Urology | Admitting: Urology

## 2017-10-24 DIAGNOSIS — Z87442 Personal history of urinary calculi: Secondary | ICD-10-CM | POA: Insufficient documentation

## 2017-10-24 DIAGNOSIS — N261 Atrophy of kidney (terminal): Secondary | ICD-10-CM | POA: Insufficient documentation

## 2017-10-24 DIAGNOSIS — N281 Cyst of kidney, acquired: Secondary | ICD-10-CM | POA: Insufficient documentation

## 2017-10-24 DIAGNOSIS — N1330 Unspecified hydronephrosis: Secondary | ICD-10-CM | POA: Diagnosis present

## 2017-10-25 ENCOUNTER — Ambulatory Visit
Admission: RE | Admit: 2017-10-25 | Discharge: 2017-10-25 | Disposition: A | Discharge status: Home / Self Care | Payer: Medicare Other | Source: Ambulatory Visit | Attending: Oncology | Admitting: Oncology

## 2017-10-25 DIAGNOSIS — M19012 Primary osteoarthritis, left shoulder: Secondary | ICD-10-CM | POA: Diagnosis not present

## 2017-10-25 DIAGNOSIS — M24012 Loose body in left shoulder: Secondary | ICD-10-CM | POA: Diagnosis not present

## 2017-10-25 DIAGNOSIS — M899 Disorder of bone, unspecified: Secondary | ICD-10-CM | POA: Diagnosis not present

## 2017-10-31 ENCOUNTER — Telehealth: Payer: Self-pay | Admitting: Oncology

## 2017-10-31 NOTE — Telephone Encounter (Signed)
CT results communicated with patient and his wife over the phone.

## 2017-11-11 ENCOUNTER — Other Ambulatory Visit
Admission: RE | Admit: 2017-11-11 | Discharge: 2017-11-11 | Disposition: A | Discharge status: Home / Self Care | Payer: Medicare Other | Source: Ambulatory Visit | Attending: Urology | Admitting: Urology

## 2017-11-11 ENCOUNTER — Ambulatory Visit (INDEPENDENT_AMBULATORY_CARE_PROVIDER_SITE_OTHER): Payer: Medicare Other | Admitting: Urology

## 2017-11-11 ENCOUNTER — Encounter: Payer: Self-pay | Admitting: Urology

## 2017-11-11 VITALS — BP 157/77 | HR 59 | Ht 70.0 in | Wt 184.0 lb

## 2017-11-11 DIAGNOSIS — Z87442 Personal history of urinary calculi: Secondary | ICD-10-CM | POA: Diagnosis not present

## 2017-11-11 DIAGNOSIS — N1330 Unspecified hydronephrosis: Secondary | ICD-10-CM | POA: Diagnosis not present

## 2017-11-11 DIAGNOSIS — R310 Gross hematuria: Secondary | ICD-10-CM | POA: Diagnosis not present

## 2017-11-11 LAB — URINALYSIS, COMPLETE (UACMP) WITH MICROSCOPIC
GLUCOSE, UA: 250 mg/dL — AB
Ketones, ur: NEGATIVE mg/dL
Nitrite: NEGATIVE
PH: 5.5 (ref 5.0–8.0)
Protein, ur: 100 mg/dL — AB
SPECIFIC GRAVITY, URINE: 1.025 (ref 1.005–1.030)

## 2017-11-11 LAB — BASIC METABOLIC PANEL
ANION GAP: 10 (ref 5–15)
BUN: 12 mg/dL (ref 6–20)
CO2: 29 mmol/L (ref 22–32)
CREATININE: 1.03 mg/dL (ref 0.61–1.24)
Calcium: 9.1 mg/dL (ref 8.9–10.3)
Chloride: 99 mmol/L — ABNORMAL LOW (ref 101–111)
GFR calc Af Amer: >60 mL/min (ref >60)
Glucose, Bld: 212 mg/dL — ABNORMAL HIGH (ref 65–99)
Potassium: 4 mmol/L (ref 3.5–5.1)
SODIUM: 138 mmol/L (ref 135–145)

## 2017-11-11 NOTE — Progress Notes (Signed)
11/11/2017 11:14 AM   Michael Bush Michael Bush 1941/07/12 [id number]  Referring provider: Clarisse Gouge, MD [address] [place], [place] [address]  Chief Complaint  Patient presents with  . Hematuria   Urologic Problem List  1. Recurrent nephrolithiasis-with prior PCNL 2010 and ureteroscopic stone removal 2018  2.  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 Dene Gentry who presents today with the complaint of gross hematuria.  He states he has been having 4 to 5 days of urine the color of red Kool-Aid.  He wife states she has noted stringy clots forming in the bottom of the commode.    Today, he is not having symptoms of frequent urination, urgency, dysuria, nocturia, incontinence, hesitancy, intermittency, straining to urinate or a weak urinary stream.   His UA today demonstrates TNTC WBC's, TNTC RBC's and many bacteria.    He states he is having back pain located on the right side.  It reached 8/10, but it has eased off.  Pain did not radiate.  Movement made the pain worse.  Nothing made the pain better.  He is not experiencing any suprapubic pain or abdominal pain.   He denies any recent fevers, chills, nausea or vomiting.   RUS completed on 10/25/2017 noted severe long-standing left-sided hydronephrosis with marked left renal atrophy.  No evidence of right hydronephrosis. Right renal cysts largest 2.2 cm.  Only right-sided ureteral jet is noted. Circumferential bladder wall thickening may be related to under distension although evaluation for detection of a mass is limited on the current exam.  `  He is a former smoker, with a 5 ppd history.  Quit 35 years ago.    PMH: Past Medical History:  Diagnosis Date  . Cancer ([place])   . Chronic kidney disease   . COPD (chronic obstructive pulmonary disease) ([place])   . Diabetes mellitus without complication ([place])   .  GERD (gastroesophageal reflux disease)   . Myocardial infarction ([place])   . Stroke [facility])     Surgical History: Past Surgical History:  Procedure Laterality Date  . COLON SURGERY      Home Medications:  Allergies as of 11/11/2017      Reactions   Vancomycin Swelling   Facial swelling      Medication List        Accurate as of 11/11/17 11:14 AM. Always use your most recent med list.          acetaminophen 325 MG tablet Commonly known as:  TYLENOL Take by mouth.   aspirin 81 MG chewable tablet Chew 81 mg by mouth every morning.   atorvastatin 10 MG tablet Commonly known as:  LIPITOR 10 mg at bedtime.   Calcium Carb-Ergocalciferol 500-200 MG-UNIT Tabs Take 2 tablets by mouth daily.   citalopram 20 MG tablet Commonly known as:  CELEXA Take 20 mg by mouth daily.   famotidine 20 MG tablet Commonly known as:  PEPCID Take by mouth.   gabapentin 300 MG capsule Commonly known as:  NEURONTIN Take 300 mg by mouth 3 (three) times daily.   JANUVIA 50 MG tablet Generic drug:  sitaGLIPtin Take by mouth.   magnesium oxide 400 MG tablet Commonly known as:  MAG-OX Take 800 mg by mouth 2 (two) times daily.   metFORMIN 1000 MG tablet Commonly known as:  GLUCOPHAGE Take 1,000 mg by mouth 2 (two) times daily.   metroNIDAZOLE  500 MG tablet Commonly known as:  FLAGYL   VITAMIN B COMPLEX PO Take 2 tablets by mouth daily.   warfarin 2.5 MG tablet Commonly known as:  COUMADIN Take 3 mg by mouth daily.       Allergies:  Allergies  Allergen Reactions  . Vancomycin Swelling    Facial swelling    Family History: Family History  Problem Relation Age of Onset  . Cancer Father     Social History:  reports that he has quit smoking. His smoking use included cigarettes. he has never used smokeless tobacco. He reports that he does not drink alcohol or use drugs.  ROS: UROLOGY Frequent Urination?: Yes Hard to postpone urination?: No Burning/pain with urination?:  No Get up at night to urinate?: Yes Leakage of urine?: No Urine stream starts and stops?: No Trouble starting stream?: No Do you have to strain to urinate?: No Blood in urine?: Yes Urinary tract infection?: No Sexually transmitted disease?: No Injury to kidneys or bladder?: No Painful intercourse?: No Weak stream?: No Erection problems?: No Penile pain?: No  Gastrointestinal Nausea?: No Vomiting?: No Indigestion/heartburn?: No Diarrhea?: No Constipation?: No  Constitutional Fever: No Night sweats?: No Weight loss?: No Fatigue?: No  Skin Skin rash/lesions?: No Itching?: No  Eyes Blurred vision?: Yes Double vision?: No  Ears/Nose/Throat Sore throat?: No Sinus problems?: No  Hematologic/Lymphatic Swollen glands?: No Easy bruising?: Yes  Cardiovascular Leg swelling?: Yes Chest pain?: No  Respiratory Cough?: No Shortness of breath?: Yes  Endocrine Excessive thirst?: No  Musculoskeletal Back pain?: Yes Joint pain?: Yes  Neurological Headaches?: No Dizziness?: No  Psychologic Depression?: No Anxiety?: No  Physical Exam: BP (!) 157/77   Pulse (!) 59   Ht 5\' 10"  (1.778 m)   Wt 184 lb (83.5 kg)   BMI 26.40 kg/m   Constitutional: Well nourished. Alert and oriented, No acute distress. HEENT: [place] AT, moist mucus membranes. Trachea midline, no masses. Cardiovascular: No clubbing, cyanosis, or edema. Respiratory: Normal respiratory effort, no increased work of breathing. GI: Abdomen is soft, non tender, non distended, no abdominal masses. Liver and spleen not palpable.  No hernias appreciated.  Stool sample for occult testing is not indicated.   GU: No CVA tenderness.  No bladder fullness or masses.   Skin: No rashes, bruises or suspicious lesions. Lymph: No cervical or inguinal adenopathy. Neurologic: Grossly intact, no focal deficits, moving all 4 extremities. Psychiatric: Normal mood and affect.  Laboratory Data: Lab Results  Component Value  Date   WBC 6.5 07/11/2017   HGB 15.6 07/11/2017   HCT 47.3 07/11/2017   MCV 84.8 07/11/2017   PLT 229 07/11/2017    Lab Results  Component Value Date   CREATININE 0.82 06/02/2017   I have reviewed the labs.  Assessment & Plan:   1. Gross hematuria  - I explained to the patient that there are a number of causes that can be associated with blood in the urine, such as stones, BPH, UTI's, damage to the urinary tract and/or cancer.  - At this time, I felt that the patient warranted further urologic evaluation.   The AUA guidelines state that a CT urogram is the preferred imaging study to evaluate hematuria.  - I explained to the patient that a contrast material will be injected into a vein and that in rare instances, an allergic reaction can result and may even life threatening   The patient denies any allergies to contrast, iodine and/or seafood and is taking metformin.  -  Following the imaging study,  I've recommended a cystoscopy. I described how this is performed, typically in an office setting with a flexible cystoscope. We described the risks, benefits, and possible side effects, the most common of which is a minor amount of blood in the urine and/or burning which usually resolves in 24 to 48 hours.    - The patient had the opportunity to ask questions which were answered. Based upon this discussion, the patient is willing to proceed. Therefore, I've ordered: a CT Urogram and cystoscopy.  - The patient will return following all of the above for discussion of the results.   - UA  - Urine culture  - BUN + creatinine    - he will be seeing Dr. Vickki Muff on Monday  2. Personal history of kidney stones  - no stone seen on recent RUS  3. Hydronephrosis  - left chronic and stable  - no right hydro on recent [place], PA-C  [place] [address], [place], [place] [address] [phone]

## 2017-11-13 ENCOUNTER — Other Ambulatory Visit: Payer: Self-pay | Admitting: Urology

## 2017-11-13 LAB — URINE CULTURE: Culture: >=100000 — AB

## 2017-11-13 MED ORDER — CEPHALEXIN 500 MG PO CAPS: 500.0000 mg | ORAL_CAPSULE | Freq: Two times a day (BID) | ORAL | 0 refills | Status: DC

## 2017-11-13 NOTE — Progress Notes (Signed)
Keflex sent to pharmacy

## 2017-11-14 ENCOUNTER — Telehealth: Payer: Self-pay | Admitting: Urology

## 2017-11-14 ENCOUNTER — Telehealth: Payer: Self-pay

## 2017-11-14 NOTE — Telephone Encounter (Signed)
LMOM

## 2017-11-14 NOTE — Telephone Encounter (Signed)
Patient's wife is under the impression the CT scan appointment is on 12/21/2017.  He cannot have his CT on 11/23/2017 as his wife has to be in [facility] that day for her colon follow up.

## 2017-11-14 NOTE — Telephone Encounter (Signed)
Spoke with pt wife in reference to abx. Wife voiced understanding.

## 2017-11-14 NOTE — Telephone Encounter (Signed)
-----   Message from Nori Riis, PA-C sent at 11/13/2017  8:17 PM EST ----- Please let Mr. Gebhard know that his urine culture was positive.  He needs to start Keflex 500 mg bid x 14 days.  I've sent a prescription to the [facility] in [place].

## 2017-11-15 ENCOUNTER — Telehealth: Payer: Self-pay | Admitting: Urology

## 2017-11-15 NOTE — Telephone Encounter (Signed)
Spoke with patient's wife and transferred her over to scheduling to reschedule the CT scan  [place]

## 2017-11-23 ENCOUNTER — Ambulatory Visit: Payer: Medicare Other

## 2017-11-30 ENCOUNTER — Ambulatory Visit
Admission: RE | Admit: 2017-11-30 | Discharge: 2017-11-30 | Disposition: A | Discharge status: Home / Self Care | Payer: Medicare Other | Source: Ambulatory Visit | Attending: Urology | Admitting: Urology

## 2017-11-30 DIAGNOSIS — N1330 Unspecified hydronephrosis: Secondary | ICD-10-CM | POA: Diagnosis not present

## 2017-11-30 DIAGNOSIS — R310 Gross hematuria: Secondary | ICD-10-CM | POA: Diagnosis present

## 2017-11-30 DIAGNOSIS — K869 Disease of pancreas, unspecified: Secondary | ICD-10-CM | POA: Diagnosis not present

## 2017-11-30 DIAGNOSIS — N200 Calculus of kidney: Secondary | ICD-10-CM | POA: Diagnosis not present

## 2017-11-30 DIAGNOSIS — N281 Cyst of kidney, acquired: Secondary | ICD-10-CM | POA: Diagnosis not present

## 2017-11-30 HISTORY — DX: Malignant neoplasm of colon, unspecified: C18.9

## 2017-11-30 MED ORDER — IOPAMIDOL (ISOVUE-300) INJECTION 61%
125.0000 mL | Freq: Once | INTRAVENOUS | Status: AC | PRN
  Administered 2017-11-30: 125 mL via INTRAVENOUS

## 2017-12-07 ENCOUNTER — Ambulatory Visit (INDEPENDENT_AMBULATORY_CARE_PROVIDER_SITE_OTHER): Payer: Medicare Other | Admitting: Urology

## 2017-12-07 DIAGNOSIS — R310 Gross hematuria: Secondary | ICD-10-CM

## 2017-12-07 LAB — URINALYSIS, COMPLETE
BILIRUBIN UA: NEGATIVE
KETONES UA: NEGATIVE
Nitrite, UA: POSITIVE — AB
SPEC GRAV UA: 1.02 (ref 1.005–1.030)
UUROB: 0.2 mg/dL (ref 0.2–1.0)
pH, UA: 6 (ref 5.0–7.5)

## 2017-12-07 LAB — MICROSCOPIC EXAMINATION

## 2017-12-07 MED ORDER — LIDOCAINE HCL 2 % EX GEL: 1.0000 "application " | Freq: Once | CUTANEOUS | Status: DC

## 2017-12-07 MED ORDER — CIPROFLOXACIN HCL 500 MG PO TABS: 500.0000 mg | ORAL_TABLET | Freq: Once | ORAL | Status: DC

## 2017-12-07 NOTE — Progress Notes (Signed)
Scheduled for cystoscopy today however urinalysis shows significant pyuria and is nitrite positive.  The cystoscopy was postponed.  Urine culture was ordered.

## 2017-12-10 LAB — CULTURE, URINE COMPREHENSIVE

## 2017-12-11 ENCOUNTER — Other Ambulatory Visit: Payer: Self-pay | Admitting: Urology

## 2017-12-11 MED ORDER — SULFAMETHOXAZOLE-TRIMETHOPRIM 800-160 MG PO TABS: 1.0000 | ORAL_TABLET | Freq: Two times a day (BID) | ORAL | 0 refills | Status: AC

## 2017-12-12 ENCOUNTER — Telehealth: Payer: Self-pay

## 2017-12-12 NOTE — Telephone Encounter (Signed)
Spoke with pt wife in reference to +ucx, abx, and INR. Wife voiced understanding of whole conversation.

## 2017-12-12 NOTE — Telephone Encounter (Signed)
-----   Message from Abbie Sons, MD sent at 12/11/2017  7:48 PM EDT ----- Urine culture was positive.  Antibiotic Rx was sent to pharmacy.  This antibiotic can affect blood thinning with Coumadin.  He will need to have an INR checked on day 5 of therapy.

## 2017-12-22 ENCOUNTER — Other Ambulatory Visit: Payer: Self-pay

## 2017-12-22 ENCOUNTER — Emergency Department: Payer: Medicare Other

## 2017-12-22 ENCOUNTER — Emergency Department
Admission: EM | Admit: 2017-12-22 | Discharge: 2017-12-22 | Disposition: A | Discharge status: Home / Self Care | Payer: Medicare Other | Attending: Emergency Medicine | Admitting: Emergency Medicine

## 2017-12-22 DIAGNOSIS — Z7982 Long term (current) use of aspirin: Secondary | ICD-10-CM | POA: Diagnosis not present

## 2017-12-22 DIAGNOSIS — K5900 Constipation, unspecified: Secondary | ICD-10-CM | POA: Diagnosis not present

## 2017-12-22 DIAGNOSIS — Z87891 Personal history of nicotine dependence: Secondary | ICD-10-CM | POA: Insufficient documentation

## 2017-12-22 DIAGNOSIS — I2510 Atherosclerotic heart disease of native coronary artery without angina pectoris: Secondary | ICD-10-CM | POA: Insufficient documentation

## 2017-12-22 DIAGNOSIS — Z7901 Long term (current) use of anticoagulants: Secondary | ICD-10-CM | POA: Insufficient documentation

## 2017-12-22 DIAGNOSIS — E1122 Type 2 diabetes mellitus with diabetic chronic kidney disease: Secondary | ICD-10-CM | POA: Diagnosis not present

## 2017-12-22 DIAGNOSIS — Z79899 Other long term (current) drug therapy: Secondary | ICD-10-CM | POA: Diagnosis not present

## 2017-12-22 DIAGNOSIS — I252 Old myocardial infarction: Secondary | ICD-10-CM | POA: Insufficient documentation

## 2017-12-22 DIAGNOSIS — R112 Nausea with vomiting, unspecified: Secondary | ICD-10-CM | POA: Insufficient documentation

## 2017-12-22 DIAGNOSIS — N189 Chronic kidney disease, unspecified: Secondary | ICD-10-CM | POA: Insufficient documentation

## 2017-12-22 DIAGNOSIS — J449 Chronic obstructive pulmonary disease, unspecified: Secondary | ICD-10-CM | POA: Diagnosis not present

## 2017-12-22 MED ORDER — DOCUSATE SODIUM 50 MG/5ML PO LIQD
100.0000 mg | Freq: Once | ORAL | Status: AC
  Administered 2017-12-22: 100 mg
  Filled 2017-12-22 (×2): qty 10

## 2017-12-22 MED ORDER — ONDANSETRON HCL 4 MG PO TABS: 4.0000 mg | ORAL_TABLET | Freq: Every day | ORAL | 0 refills | Status: DC | PRN

## 2017-12-22 MED ORDER — ONDANSETRON 4 MG PO TBDP
ORAL_TABLET | ORAL | Status: AC
  Filled 2017-12-22: qty 1

## 2017-12-22 MED ORDER — LACTULOSE 20 G PO PACK: 20.0000 g | PACK | Freq: Every day | ORAL | 0 refills | Status: DC | PRN

## 2017-12-22 MED ORDER — MAGNESIUM CITRATE PO SOLN
1.0000 | Freq: Once | ORAL | Status: AC
  Administered 2017-12-22: 1 via ORAL
  Filled 2017-12-22 (×2): qty 296

## 2017-12-22 MED ORDER — ONDANSETRON 4 MG PO TBDP
4.0000 mg | ORAL_TABLET | Freq: Once | ORAL | Status: AC
  Administered 2017-12-22: 4 mg via ORAL

## 2017-12-22 NOTE — ED Notes (Signed)
Patient transported to CT 

## 2017-12-22 NOTE — ED Triage Notes (Addendum)
Pt states that he has not had a bowel movement in 7 days - spouse states it has been 2 weeks  - they have tried supp, stool softeners, benefiber, probiotics - he denies any pain - decreased energy - no other symptoms - pt is able to pass gas

## 2017-12-22 NOTE — ED Notes (Signed)
ED Provider at bedside. 

## 2017-12-22 NOTE — ED Provider Notes (Signed)
[facility] Emergency Department Provider Note  ____________________________________________   First MD Initiated Contact with Patient 12/22/17 1426     (approximate)  I have reviewed the triage vital signs and the nursing notes.   HISTORY  Chief Complaint Constipation   HPI Michael Bush is a 77 y.o. male with a history of colon cancer now in remission who is presenting with approximately 2 weeks without a bowel movement.  He says that he has now feeling uncomfortable, diffusely to his abdomen but denies any pain.  Denies any nausea or vomiting.  Says that he has not been walking as much as he recently does because of a UTI.  He is on Bactrim for this.  He also states that he is INR checked and it was high today but he is a plan to skip a dose and then reduce his dose.  Denies any dietary changes.  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   Past Medical History:  Diagnosis Date  . Chronic kidney disease   . Colon cancer ([place]) 2003   Hx Partial colon resection, chemo + rad tx's.  Marland Kitchen COPD (chronic obstructive pulmonary disease) ([place])   . Diabetes mellitus without complication ([place])   . GERD (gastroesophageal reflux disease)   . Myocardial infarction ([place])   . Stroke [facility])     Patient Active Problem List   Diagnosis Date Noted  . Personal history of kidney stones 10/13/2017  . Hydronephrosis 10/13/2017  . Humerus lesion, left 06/01/2017  . Cerebral infarction ([place]) 07/23/2015  . Cerebrovascular accident (CVA) due to vascular occlusion ([place]) 07/23/2015  . Chronic obstructive pulmonary disease ([place]) 03/13/2015  . Kidney stone 11/20/2014  . Acute renal failure (ARF) ([place]) 10/09/2014  . ARF (acute renal failure) ([place]) 10/09/2014  . Bladder mass 10/09/2014  . Cardiac pacemaker in situ 04/26/2014  . Bradycardia 04/25/2014  . Chronic  suppurative otitis media 03/07/2014  . CAD (coronary artery disease) 01/15/2014  . Atrial fibrillation ([place]) 12/04/2013    Past Surgical History:  Procedure Laterality Date  . COLON SURGERY      Prior to Admission medications   Medication Sig Start Date End Date Taking? Authorizing Provider  acetaminophen (TYLENOL) 325 MG tablet Take by mouth. 10/11/14   [provider]  aspirin 81 MG chewable tablet Chew 81 mg by mouth every morning. 07/02/15   [provider]  atorvastatin (LIPITOR) 10 MG tablet 10 mg at bedtime. 09/14/16   [provider]  B Complex Vitamins (VITAMIN B COMPLEX PO) Take 2 tablets by mouth daily.    [provider]  Calcium Carb-Ergocalciferol 500-200 MG-UNIT TABS Take 2 tablets by mouth daily.    [provider]  cephALEXin (KEFLEX) 500 MG capsule Take 1 capsule (500 mg total) by mouth 2 (two) times daily. 11/13/17   Zara Council A, PA-C  citalopram (CELEXA) 20 MG tablet Take 20 mg by mouth daily. 03/14/17 03/14/18  [provider]  famotidine (PEPCID) 20 MG tablet Take by mouth. 10/14/17 10/14/18  [provider]  gabapentin (NEURONTIN) 300 MG capsule Take 300 mg by mouth 3 (three) times daily. 08/05/15   [provider]  magnesium oxide (MAG-OX) 400 MG tablet Take 800 mg by mouth 2 (two) times daily.    [provider]  metFORMIN (GLUCOPHAGE) 1000 MG tablet Take 1,000 mg by mouth 2 (two)  times daily.    [provider]  metroNIDAZOLE (FLAGYL) 500 MG tablet  10/14/17   [provider]  sitaGLIPtin (JANUVIA) 50 MG tablet Take by mouth. 12/02/16 12/02/17  [provider]  warfarin (COUMADIN) 2.5 MG tablet Take 3 mg by mouth daily. 02/24/17   [provider]    Allergies Vancomycin  Family History  Problem Relation Age of Onset  . Cancer Father     Social History Social History   Tobacco Use  . Smoking status: Former Smoker    Types: Cigarettes  .  Smokeless tobacco: Never Used  Substance Use Topics  . Alcohol use: No  . Drug use: No    Review of Systems  Constitutional: No fever/chills Eyes: No visual changes. ENT: No sore throat. Cardiovascular: Denies chest pain. Respiratory: Denies shortness of breath. Gastrointestinal: No abdominal pain.  No nausea, no vomiting.  No diarrhea.  Genitourinary: Negative for dysuria. Musculoskeletal: Negative for back pain. Skin: Negative for rash. Neurological: Negative for headaches, focal weakness or numbness.   ____________________________________________   PHYSICAL EXAM:  VITAL SIGNS: ED Triage Vitals  Enc Vitals Group     BP 12/22/17 1305 (!) 153/75     Pulse Rate 12/22/17 1305 67     Resp 12/22/17 1305 16     Temp 12/22/17 1305 97.8 F (36.6 C)     Temp Source 12/22/17 1305 Oral     SpO2 12/22/17 1305 98 %     Weight 12/22/17 1303 186 lb (84.4 kg)     Height 12/22/17 1303 5\' 10"  (1.778 m)     Head Circumference --      Peak Flow --      Pain Score 12/22/17 1303 0     Pain Loc --      Pain Edu? --      Excl. in [place]? --     Constitutional: Alert and oriented. Well appearing and in no acute distress. Eyes: Conjunctivae are normal.  Head: Atraumatic. Nose: No congestion/rhinnorhea. Mouth/Throat: Mucous membranes are moist.  Neck: No stridor.   Cardiovascular: Normal rate, regular rhythm. Grossly normal heart sounds.   Respiratory: Normal respiratory effort.  No retractions. Lungs CTAB. Gastrointestinal: Soft and nontender. No distention.  Digital rectal exam without fecal impaction.  No stool in the vault. Musculoskeletal: No lower extremity tenderness nor edema.  No joint effusions. Neurologic:  Normal speech and language. No gross focal neurologic deficits are appreciated. Skin:  Skin is warm, dry and intact. No rash noted. Psychiatric: Mood and affect are normal. Speech and behavior are normal.  ____________________________________________   LABS (all labs  ordered are listed, but only abnormal results are displayed)  Labs Reviewed - No data to display ____________________________________________  EKG   ____________________________________________  RADIOLOGY  Patient with a CAT scan with large stool burden but otherwise unchanged without any acute disease. ____________________________________________   PROCEDURES  Procedure(s) performed:   Procedures  Critical Care performed:   ____________________________________________   INITIAL IMPRESSION / ASSESSMENT AND PLAN / ED COURSE  Pertinent labs & imaging results that were available during my care of the patient were reviewed by me and considered in my medical decision making (see chart for details).  DDX: Constipation, bowel obstruction, ileus, fecal impaction, cancer recurrence As part of my medical decision making, I reviewed the following data within the electronic MEDICAL RECORD NUMBER Notes from prior office visits as well as imaging  ----------------------------------------- 7:41 PM on 12/22/2017 -----------------------------------------  Patient had a large bowel movement  and now feels relieved.  Had several episodes of vomiting but given Zofran and now is without any nausea and vomiting.  Patient be discharged with several dose of lactulose to be used as needed as well as Zofran to be used as needed.  He is understanding of the diagnosis as well as treatment plan willing to comply. ____________________________________________   FINAL CLINICAL IMPRESSION(S) / ED DIAGNOSES  Constipation.  Nausea and vomiting.    NEW MEDICATIONS STARTED DURING THIS VISIT:  New Prescriptions   No medications on file     Note:  This document was prepared using Dragon voice recognition software and may include unintentional dictation errors.     Orbie Pyo, MD 12/22/17 [phone]

## 2017-12-22 NOTE — ED Triage Notes (Signed)
FIRST NURSE NOTE-has not had bowel movement in 8 days per pt.  In wheelchair. Alert. NAD

## 2017-12-22 NOTE — ED Notes (Signed)
E-signature pad unavailable at this time.  Patient unable to sign at this time.

## 2018-01-03 ENCOUNTER — Other Ambulatory Visit: Payer: Self-pay | Admitting: Unknown Physician Specialty

## 2018-01-03 ENCOUNTER — Other Ambulatory Visit: Payer: Self-pay | Admitting: Radiology

## 2018-01-03 DIAGNOSIS — D110 Benign neoplasm of parotid gland: Secondary | ICD-10-CM

## 2018-01-04 ENCOUNTER — Ambulatory Visit
Admission: RE | Admit: 2018-01-04 | Discharge: 2018-01-04 | Disposition: A | Discharge status: Home / Self Care | Payer: Medicare Other | Source: Ambulatory Visit | Attending: Unknown Physician Specialty | Admitting: Unknown Physician Specialty

## 2018-01-04 DIAGNOSIS — D110 Benign neoplasm of parotid gland: Secondary | ICD-10-CM

## 2018-01-09 ENCOUNTER — Other Ambulatory Visit: Payer: Self-pay | Admitting: Unknown Physician Specialty

## 2018-01-09 DIAGNOSIS — D110 Benign neoplasm of parotid gland: Secondary | ICD-10-CM

## 2018-01-10 ENCOUNTER — Other Ambulatory Visit: Payer: Self-pay | Admitting: Unknown Physician Specialty

## 2018-01-10 DIAGNOSIS — D110 Benign neoplasm of parotid gland: Secondary | ICD-10-CM

## 2018-01-17 ENCOUNTER — Ambulatory Visit: Payer: Medicare Other

## 2018-01-25 ENCOUNTER — Telehealth: Payer: Self-pay | Admitting: Urology

## 2018-01-25 DIAGNOSIS — L718 Other rosacea: Secondary | ICD-10-CM | POA: Insufficient documentation

## 2018-01-25 NOTE — Telephone Encounter (Signed)
Mr. Garringer is still in need of his cystoscopy.  His cystoscopy was postponed due to his urine being infected.

## 2018-01-26 ENCOUNTER — Encounter: Payer: Self-pay | Admitting: *Deleted

## 2018-01-26 ENCOUNTER — Telehealth: Payer: Self-pay | Admitting: Urology

## 2018-01-26 NOTE — Telephone Encounter (Signed)
App made and patient is aware ° °Michael Bush °

## 2018-01-26 NOTE — Telephone Encounter (Signed)
Please schedule, thank you.

## 2018-01-27 ENCOUNTER — Ambulatory Visit: Payer: Medicare Other | Admitting: Anesthesiology

## 2018-01-27 ENCOUNTER — Encounter: Payer: Self-pay | Admitting: Certified Registered Nurse Anesthetist

## 2018-01-27 ENCOUNTER — Encounter
Admission: RE | Disposition: A | Discharge status: Home / Self Care | Payer: Self-pay | Source: Ambulatory Visit | Attending: Gastroenterology

## 2018-01-27 ENCOUNTER — Ambulatory Visit
Admission: RE | Admit: 2018-01-27 | Discharge: 2018-01-27 | Disposition: A | Discharge status: Home / Self Care | Payer: Medicare Other | Source: Ambulatory Visit | Attending: Gastroenterology | Admitting: Gastroenterology

## 2018-01-27 DIAGNOSIS — E1122 Type 2 diabetes mellitus with diabetic chronic kidney disease: Secondary | ICD-10-CM | POA: Diagnosis not present

## 2018-01-27 DIAGNOSIS — Z9221 Personal history of antineoplastic chemotherapy: Secondary | ICD-10-CM | POA: Diagnosis not present

## 2018-01-27 DIAGNOSIS — I2510 Atherosclerotic heart disease of native coronary artery without angina pectoris: Secondary | ICD-10-CM | POA: Insufficient documentation

## 2018-01-27 DIAGNOSIS — Z7901 Long term (current) use of anticoagulants: Secondary | ICD-10-CM | POA: Insufficient documentation

## 2018-01-27 DIAGNOSIS — Z888 Allergy status to other drugs, medicaments and biological substances status: Secondary | ICD-10-CM | POA: Insufficient documentation

## 2018-01-27 DIAGNOSIS — I509 Heart failure, unspecified: Secondary | ICD-10-CM | POA: Insufficient documentation

## 2018-01-27 DIAGNOSIS — Z79899 Other long term (current) drug therapy: Secondary | ICD-10-CM | POA: Insufficient documentation

## 2018-01-27 DIAGNOSIS — K602 Anal fissure, unspecified: Secondary | ICD-10-CM | POA: Insufficient documentation

## 2018-01-27 DIAGNOSIS — Z8673 Personal history of transient ischemic attack (TIA), and cerebral infarction without residual deficits: Secondary | ICD-10-CM | POA: Diagnosis not present

## 2018-01-27 DIAGNOSIS — Z950 Presence of cardiac pacemaker: Secondary | ICD-10-CM | POA: Diagnosis not present

## 2018-01-27 DIAGNOSIS — Z85038 Personal history of other malignant neoplasm of large intestine: Secondary | ICD-10-CM | POA: Diagnosis not present

## 2018-01-27 DIAGNOSIS — E039 Hypothyroidism, unspecified: Secondary | ICD-10-CM | POA: Diagnosis not present

## 2018-01-27 DIAGNOSIS — K219 Gastro-esophageal reflux disease without esophagitis: Secondary | ICD-10-CM | POA: Insufficient documentation

## 2018-01-27 DIAGNOSIS — M1990 Unspecified osteoarthritis, unspecified site: Secondary | ICD-10-CM | POA: Insufficient documentation

## 2018-01-27 DIAGNOSIS — I252 Old myocardial infarction: Secondary | ICD-10-CM | POA: Insufficient documentation

## 2018-01-27 DIAGNOSIS — K641 Second degree hemorrhoids: Secondary | ICD-10-CM | POA: Insufficient documentation

## 2018-01-27 DIAGNOSIS — Z7982 Long term (current) use of aspirin: Secondary | ICD-10-CM | POA: Insufficient documentation

## 2018-01-27 DIAGNOSIS — J449 Chronic obstructive pulmonary disease, unspecified: Secondary | ICD-10-CM | POA: Insufficient documentation

## 2018-01-27 DIAGNOSIS — D123 Benign neoplasm of transverse colon: Secondary | ICD-10-CM | POA: Diagnosis not present

## 2018-01-27 DIAGNOSIS — D124 Benign neoplasm of descending colon: Secondary | ICD-10-CM | POA: Diagnosis not present

## 2018-01-27 DIAGNOSIS — N189 Chronic kidney disease, unspecified: Secondary | ICD-10-CM | POA: Insufficient documentation

## 2018-01-27 DIAGNOSIS — Z980 Intestinal bypass and anastomosis status: Secondary | ICD-10-CM | POA: Diagnosis not present

## 2018-01-27 DIAGNOSIS — I130 Hypertensive heart and chronic kidney disease with heart failure and stage 1 through stage 4 chronic kidney disease, or unspecified chronic kidney disease: Secondary | ICD-10-CM | POA: Diagnosis not present

## 2018-01-27 DIAGNOSIS — Z1211 Encounter for screening for malignant neoplasm of colon: Secondary | ICD-10-CM | POA: Insufficient documentation

## 2018-01-27 DIAGNOSIS — Z7984 Long term (current) use of oral hypoglycemic drugs: Secondary | ICD-10-CM | POA: Insufficient documentation

## 2018-01-27 DIAGNOSIS — Z881 Allergy status to other antibiotic agents status: Secondary | ICD-10-CM | POA: Insufficient documentation

## 2018-01-27 DIAGNOSIS — Z87891 Personal history of nicotine dependence: Secondary | ICD-10-CM | POA: Insufficient documentation

## 2018-01-27 DIAGNOSIS — K5730 Diverticulosis of large intestine without perforation or abscess without bleeding: Secondary | ICD-10-CM | POA: Insufficient documentation

## 2018-01-27 DIAGNOSIS — Z923 Personal history of irradiation: Secondary | ICD-10-CM | POA: Insufficient documentation

## 2018-01-27 DIAGNOSIS — I4891 Unspecified atrial fibrillation: Secondary | ICD-10-CM | POA: Diagnosis not present

## 2018-01-27 DIAGNOSIS — Z9861 Coronary angioplasty status: Secondary | ICD-10-CM | POA: Insufficient documentation

## 2018-01-27 HISTORY — DX: Sick sinus syndrome: I49.5

## 2018-01-27 HISTORY — DX: Heart failure, unspecified: I50.9

## 2018-01-27 HISTORY — DX: Atherosclerotic heart disease of native coronary artery without angina pectoris: I25.10

## 2018-01-27 HISTORY — DX: Long term (current) use of anticoagulants: Z79.01

## 2018-01-27 HISTORY — DX: Presence of cardiac pacemaker: Z95.0

## 2018-01-27 HISTORY — DX: Mixed conductive and sensorineural hearing loss, unspecified: H90.8

## 2018-01-27 HISTORY — DX: Essential (primary) hypertension: I10

## 2018-01-27 HISTORY — PX: COLONOSCOPY WITH PROPOFOL: SHX5780

## 2018-01-27 HISTORY — DX: Unspecified osteoarthritis, unspecified site: M19.90

## 2018-01-27 HISTORY — DX: Hypothyroidism, unspecified: E03.9

## 2018-01-27 HISTORY — DX: Cardiac arrhythmia, unspecified: I49.9

## 2018-01-27 HISTORY — DX: Presence of intraocular lens: Z96.1

## 2018-01-27 LAB — GLUCOSE, CAPILLARY: GLUCOSE-CAPILLARY: 166 mg/dL — AB (ref 65–99)

## 2018-01-27 LAB — PROTIME-INR
INR: 1.12
Prothrombin Time: 14.3 seconds (ref 11.4–15.2)

## 2018-01-27 SURGERY — COLONOSCOPY WITH PROPOFOL
Anesthesia: General

## 2018-01-27 MED ORDER — SODIUM CHLORIDE 0.9 % IV SOLN
INTRAVENOUS | Status: DC
  Administered 2018-01-27: 1000 mL via INTRAVENOUS

## 2018-01-27 MED ORDER — PHENYLEPHRINE HCL 10 MG/ML IJ SOLN
INTRAMUSCULAR | Status: DC | PRN
  Administered 2018-01-27: 100 ug via INTRAVENOUS

## 2018-01-27 MED ORDER — LIDOCAINE HCL (CARDIAC) PF 100 MG/5ML IV SOSY
PREFILLED_SYRINGE | INTRAVENOUS | Status: DC | PRN
  Administered 2018-01-27: 50 mg via INTRAVENOUS

## 2018-01-27 MED ORDER — PROPOFOL 10 MG/ML IV BOLUS
INTRAVENOUS | Status: DC | PRN
  Administered 2018-01-27: 100 mg via INTRAVENOUS

## 2018-01-27 MED ORDER — PROPOFOL 500 MG/50ML IV EMUL
INTRAVENOUS | Status: AC
  Filled 2018-01-27: qty 50

## 2018-01-27 MED ORDER — LIDOCAINE HCL (PF) 1 % IJ SOLN
INTRAMUSCULAR | Status: AC
Start: 2018-01-27 — End: 2018-01-27
  Administered 2018-01-27: 0.3 mL via INTRADERMAL
  Filled 2018-01-27: qty 2

## 2018-01-27 MED ORDER — PROPOFOL 500 MG/50ML IV EMUL
INTRAVENOUS | Status: DC | PRN
  Administered 2018-01-27: 130 ug/kg/min via INTRAVENOUS

## 2018-01-27 MED ORDER — GLYCOPYRROLATE 0.2 MG/ML IJ SOLN
INTRAMUSCULAR | Status: DC | PRN
  Administered 2018-01-27: 0.2 mg via INTRAVENOUS

## 2018-01-27 MED ORDER — PHENYLEPHRINE HCL 10 MG/ML IJ SOLN
INTRAMUSCULAR | Status: AC
  Filled 2018-01-27: qty 1

## 2018-01-27 MED ORDER — LIDOCAINE HCL (PF) 1 % IJ SOLN
2.0000 mL | Freq: Once | INTRAMUSCULAR | Status: AC
  Administered 2018-01-27: 0.3 mL via INTRADERMAL

## 2018-01-27 MED ORDER — LIDOCAINE HCL (PF) 2 % IJ SOLN
INTRAMUSCULAR | Status: AC
  Filled 2018-01-27: qty 10

## 2018-01-27 NOTE — Anesthesia Post-op Follow-up Note (Signed)
Anesthesia QCDR form completed.        

## 2018-01-27 NOTE — Op Note (Signed)
[facility] Gastroenterology Patient Name: Michael Bush Procedure Date: 01/27/2018 9:25 AM MRN: [id number] Account #: [id number] Date of Birth: 1941-07-16 Admit Type: Outpatient Age: 77 Room: [facility] ENDO ROOM 1 Gender: Male Note Status: Finalized Procedure:            Colonoscopy Indications:          Personal history of malignant neoplasm of the colon Providers:            Lollie Sails, MD Referring MD:         Bo Mcclintock. Vickki Muff (Referring MD) Medicines:            Monitored Anesthesia Care Complications:        No immediate complications. Procedure:            Pre-Anesthesia Assessment:                       - ASA Grade Assessment: III - A patient with severe                        systemic disease.                       After obtaining informed consent, the colonoscope was                        passed under direct vision. Throughout the procedure,                        the patient's blood pressure, pulse, and oxygen                        saturations were monitored continuously. The                        Colonoscope was introduced through the anus and                        advanced to the the cecum, identified by appendiceal                        orifice and ileocecal valve. The colonoscopy was                        performed without difficulty. The patient tolerated the                        procedure well. The quality of the bowel preparation                        was good. Findings:      Multiple small and large-mouthed diverticula were found in the sigmoid       colon and descending colon.      There was evidence of a prior end-to-end colo-colonic anastomosis in the       mid rectum. This was patent and was characterized by healthy appearing       mucosa. The anastomosis was traversed.      Non-bleeding internal hemorrhoids were found during perianal exam and       during anoscopy. The hemorrhoids were small and Grade II (internal   hemorrhoids that prolapse but reduce spontaneously).  There is a small fissure noted in the distal anal canal. This bled a       small amount.      A 3 mm polyp was found in the hepatic flexure. The polyp was sessile.       The polyp was removed with a cold biopsy forceps. Resection and       retrieval were complete.      A 6 mm polyp was found in the descending colon. The polyp was sessile.       The polyp was removed with a cold snare. Resection and retrieval were       complete.      The digital rectal exam was normal otherwise. Impression:           - Diverticulosis in the sigmoid colon and in the                        descending colon.                       - Patent end-to-end colo-colonic anastomosis,                        characterized by healthy appearing mucosa.                       - Non-bleeding internal hemorrhoids.                       - One 3 mm polyp at the hepatic flexure, removed with a                        cold biopsy forceps. Resected and retrieved.                       - One 6 mm polyp in the descending colon, removed with                        a cold snare. Resected and retrieved. Recommendation:       - Soft diet today, then advance as tolerated to advance                        diet as tolerated.                       - Use Analpram HC Cream 2.5%: Apply externally TID for                        7 days. Procedure Code(s):    --- Professional ---                       [phone], Colonoscopy, flexible; with removal of tumor(s),                        polyp(s), or other lesion(s) by snare technique                       45380, 59, Colonoscopy, flexible; with biopsy, single                        or multiple Diagnosis Code(s):    ---  Professional ---                       K64.1, Second degree hemorrhoids                       Z98.0, Intestinal bypass and anastomosis status                       D12.3, Benign neoplasm of transverse colon (hepatic                         flexure or splenic flexure)                       D12.4, Benign neoplasm of descending colon                       Z85.038, Personal history of other malignant neoplasm                        of large intestine                       K57.30, Diverticulosis of large intestine without                        perforation or abscess without bleeding CPT copyright 2017 American Medical Association. All rights reserved. The codes documented in this report are preliminary and upon coder review may  be revised to meet current compliance requirements. Lollie Sails, MD 01/27/2018 10:00:59 AM This report has been signed electronically. Number of Addenda: 0 Note Initiated On: 01/27/2018 9:25 AM Scope Withdrawal Time: 0 hours 15 minutes 33 seconds  Total Procedure Duration: 0 hours 22 minutes 6 seconds       [facility]

## 2018-01-27 NOTE — Anesthesia Preprocedure Evaluation (Signed)
Anesthesia Evaluation  Patient identified by MRN, date of birth, ID band Patient awake    Reviewed: Allergy & Precautions, H&P , NPO status , Patient's Chart, lab work & pertinent test results, reviewed documented beta blocker date and time   Airway Mallampati: II   Neck ROM: full    Dental  (+) Poor Dentition   Pulmonary neg pulmonary ROS, COPD, former smoker,    Pulmonary exam normal        Cardiovascular hypertension, + CAD, + Past MI and +CHF  negative cardio ROS Normal cardiovascular exam+ dysrhythmias + pacemaker  Rhythm:regular Rate:Normal     Neuro/Psych CVA negative neurological ROS  negative psych ROS   GI/Hepatic negative GI ROS, Neg liver ROS, GERD  ,  Endo/Other  negative endocrine ROSdiabetesHypothyroidism   Renal/GU Renal diseasenegative Renal ROS  negative genitourinary   Musculoskeletal   Abdominal   Peds  Hematology negative hematology ROS (+)   Anesthesia Other Findings Past Medical History: No date: Arthritis No date: CHF (congestive heart failure) (HCC) No date: Chronic kidney disease     Comment:  chronic kidney disease 2003: Colon cancer ([place])     Comment:  Hx Partial colon resection, chemo + rad tx's. No date: COPD (chronic obstructive pulmonary disease) (H[facility]) No date: Coronary artery disease No date: Diabetes mellitus without complication (H[facility]) No date: Dysrhythmia     Comment:  atrial fib., bradycardia No date: GERD (gastroesophageal reflux disease) No date: Hypertension No date: Hypothyroidism No date: Long term (current) use of anticoagulants No date: Mixed hearing loss, unilateral     Comment:  03/07/14 No date: Myocardial infarction [facility]) No date: Presence of permanent cardiac pacemaker     Comment:  [place] DDDR [phone] No date: Pseudophakia of both eyes 04/26/2014: Sick sinus syndrome ([place]) No date: Stroke [facility]) Past Surgical History: No date:  Cardioversion External No date: Cataract cortical ssenile No date: COLON SURGERY No date: COLON SURGERY No date: CORONARY ANGIOPLASTY No date: Insertion dual chamber pacemaker generator   Reproductive/Obstetrics negative OB ROS                             Anesthesia Physical Anesthesia Plan  ASA: IV  Anesthesia Plan: General   Post-op Pain Management:    Induction:   PONV Risk Score and Plan:   Airway Management Planned:   Additional Equipment:   Intra-op Plan:   Post-operative Plan:   Informed Consent: I have reviewed the patients History and Physical, chart, labs and discussed the procedure including the risks, benefits and alternatives for the proposed anesthesia with the patient or authorized representative who has indicated his/her understanding and acceptance.   Dental Advisory Given  Plan Discussed with: CRNA  Anesthesia Plan Comments:         Anesthesia Quick Evaluation

## 2018-01-27 NOTE — Transfer of Care (Signed)
Immediate Anesthesia Transfer of Care Note  Patient: Michael Bush  Procedure(s) Performed: COLONOSCOPY WITH PROPOFOL (N/A )  Patient Location: PACU and Endoscopy Unit  Anesthesia Type:General  Level of Consciousness: drowsy  Airway & Oxygen Therapy: Patient Spontanous Breathing and Patient connected to nasal cannula oxygen  Post-op Assessment: Report given to RN and Post -op Vital signs reviewed and stable  Post vital signs: Reviewed and stable  Last Vitals:  Vitals Value Taken Time  BP 91/47 01/27/2018  9:57 AM  Temp 36.2 C 01/27/2018  9:57 AM  Pulse 59 01/27/2018  9:57 AM  Resp 20 01/27/2018  9:57 AM  SpO2 99 % 01/27/2018  9:57 AM  Vitals shown include unvalidated device data.  Last Pain:  Vitals:   01/27/18 0856  TempSrc: Tympanic  PainSc: 0-No pain         Complications: No apparent anesthesia complications

## 2018-01-27 NOTE — H&P (Signed)
Outpatient short stay form Pre-procedure 01/27/2018 9:15 AM Lollie Sails MD  Primary Physician: Kirkland Hun MD  Reason for visit: Patient is a 77 year old male presenting today as above.  He has a personal history of colon cancer for which he underwent what sounds like a sigmoid colectomy 2003.  He is on Coumadin that was suspended 5 days ago and his INR today is 1.12.  Tolerated his prep well.  He does take a 81 mg aspirin as well.  No other blood thinners or aspirin products.  History of present illness: Patient also indicates some bowel habit changes with increasing constipation..  This is currently resolved.  Discussed with patient.    Current Facility-Administered Medications:  .  0.9 %  sodium chloride infusion, , Intravenous, Continuous, Lollie Sails, MD .  lidocaine (PF) (XYLOCAINE) 1 % injection 2 mL, 2 mL, Intradermal, Once, Lollie Sails, MD .  lidocaine (PF) (XYLOCAINE) 1 % injection, , , ,   Medications Prior to Admission  Medication Sig Dispense Refill Last Dose  . acetaminophen (TYLENOL) 325 MG tablet Take by mouth.   Taking  . aspirin 81 MG chewable tablet Chew 81 mg by mouth every morning.   01/21/2018  . atorvastatin (LIPITOR) 10 MG tablet 10 mg at bedtime.   Taking  . B Complex Vitamins (VITAMIN B COMPLEX PO) Take 2 tablets by mouth daily.   Taking  . Calcium Carb-Ergocalciferol 500-200 MG-UNIT TABS Take 2 tablets by mouth daily.   Taking  . cephALEXin (KEFLEX) 500 MG capsule Take 1 capsule (500 mg total) by mouth 2 (two) times daily. 14 capsule 0   . citalopram (CELEXA) 20 MG tablet Take 20 mg by mouth daily.   Taking  . famotidine (PEPCID) 20 MG tablet Take by mouth.   Taking  . gabapentin (NEURONTIN) 300 MG capsule Take 300 mg by mouth 3 (three) times daily.   Taking  . lactulose (CEPHULAC) 20 g packet Take 1 packet (20 g total) by mouth daily as needed (constipation). 5 each 0   . magnesium oxide (MAG-OX) 400 MG tablet Take 800 mg by mouth 2 (two)  times daily.   Taking  . metFORMIN (GLUCOPHAGE) 1000 MG tablet Take 1,000 mg by mouth 2 (two) times daily.   Taking  . metroNIDAZOLE (FLAGYL) 500 MG tablet   0 Taking  . ondansetron (ZOFRAN) 4 MG tablet Take 1 tablet (4 mg total) by mouth daily as needed. 10 tablet 0   . sitaGLIPtin (JANUVIA) 50 MG tablet Take by mouth.   Taking  . warfarin (COUMADIN) 2.5 MG tablet Take 3 mg by mouth daily.   01/21/2018     Allergies  Allergen Reactions  . Vancomycin Swelling    Facial swelling  . Pollen Extract   . Pradaxa [Dabigatran Etexilate Mesylate]      Past Medical History:  Diagnosis Date  . Arthritis   . CHF (congestive heart failure) ([place])   . Chronic kidney disease    chronic kidney disease  . Colon cancer ([place]) 2003   Hx Partial colon resection, chemo + rad tx's.  Marland Kitchen COPD (chronic obstructive pulmonary disease) ([place])   . Coronary artery disease   . Diabetes mellitus without complication ([place])   . Dysrhythmia    atrial fib., bradycardia  . GERD (gastroesophageal reflux disease)   . Hypertension   . Hypothyroidism   . Long term (current) use of anticoagulants   . Mixed hearing loss, unilateral    03/07/14  . Myocardial  infarction ([place])   . Presence of permanent cardiac pacemaker    [employer] DDDR [phone]  . Pseudophakia of both eyes   . Sick sinus syndrome ([place]) 04/26/2014  . Stroke [facility])     Review of systems:      Physical Exam    Heart and lungs: Regular rate and rhythm without rub or gallop, lungs are bilaterally clear.    HEENT: Normal cephalic atraumatic eyes are anicteric the right eye is slightly erythematous.    Other:    Pertinant exam for procedure: Soft nontender nondistended bowel sounds positive normoactive    Planned proceedures: Colonoscopy and indicated procedures. I have discussed the risks benefits and complications of procedures to include not limited to bleeding, infection, perforation and the risk of sedation and the patient  wishes to proceed.    Lollie Sails, MD Gastroenterology 01/27/2018  9:15 AM

## 2018-01-30 ENCOUNTER — Encounter: Payer: Self-pay | Admitting: Gastroenterology

## 2018-01-30 LAB — SURGICAL PATHOLOGY

## 2018-01-30 NOTE — Anesthesia Postprocedure Evaluation (Signed)
Anesthesia Post Note  Patient: Michael Bush  Procedure(s) Performed: COLONOSCOPY WITH PROPOFOL (N/A )  Patient location during evaluation: PACU Anesthesia Type: General Level of consciousness: awake and alert Pain management: pain level controlled Vital Signs Assessment: post-procedure vital signs reviewed and stable Respiratory status: spontaneous breathing, nonlabored ventilation, respiratory function stable and patient connected to nasal cannula oxygen Cardiovascular status: blood pressure returned to baseline and stable Postop Assessment: no apparent nausea or vomiting Anesthetic complications: no     Last Vitals:  Vitals:   01/27/18 1017 01/27/18 1027  BP: 128/64 (!) 127/53  Pulse: 60 64  Resp: 13 14  Temp:    SpO2: 100% 96%    Last Pain:  Vitals:   01/27/18 1027  TempSrc:   PainSc: 0-No pain                 Molli Barrows

## 2018-02-01 ENCOUNTER — Encounter: Payer: Self-pay | Admitting: Urology

## 2018-02-01 ENCOUNTER — Ambulatory Visit (INDEPENDENT_AMBULATORY_CARE_PROVIDER_SITE_OTHER): Payer: Medicare Other | Admitting: Urology

## 2018-02-01 VITALS — BP 174/83 | HR 60 | Resp 16 | Ht 70.0 in | Wt 193.5 lb

## 2018-02-01 DIAGNOSIS — R310 Gross hematuria: Secondary | ICD-10-CM | POA: Diagnosis not present

## 2018-02-01 LAB — URINALYSIS, COMPLETE
BILIRUBIN UA: NEGATIVE
Ketones, UA: NEGATIVE
LEUKOCYTES UA: NEGATIVE
Nitrite, UA: NEGATIVE
PH UA: 5.5 (ref 5.0–7.5)
RBC, UA: NEGATIVE
Specific Gravity, UA: 1.015 (ref 1.005–1.030)
Urobilinogen, Ur: 0.2 mg/dL (ref 0.2–1.0)

## 2018-02-01 LAB — MICROSCOPIC EXAMINATION
Bacteria, UA: NONE SEEN
WBC, UA: NONE SEEN /hpf (ref 0–5)

## 2018-02-01 MED ORDER — CIPROFLOXACIN HCL 500 MG PO TABS
500.0000 mg | ORAL_TABLET | Freq: Once | ORAL | Status: AC
  Administered 2018-02-01: 500 mg via ORAL

## 2018-02-01 MED ORDER — LIDOCAINE HCL URETHRAL/MUCOSAL 2 % EX GEL
1.0000 "application " | Freq: Once | CUTANEOUS | Status: AC
  Administered 2018-02-01: 1 via URETHRAL

## 2018-02-01 MED ORDER — TAMSULOSIN HCL 0.4 MG PO CAPS: 0.4000 mg | ORAL_CAPSULE | Freq: Every day | ORAL | 3 refills | Status: DC

## 2018-02-01 NOTE — Progress Notes (Signed)
   02/01/18  CC: No chief complaint on file.   HPI: He recently saw Zara Council after an episode of gross hematuria.  His urine culture was positive at the time of that visit and he completed antibiotic therapy.  He is on chronic Coumadin therapy.  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  A CT urogram showed stable left hydronephrosis and no suspicious abnormalities.  Blood pressure (!) 174/83, pulse 60, resp. rate 16, height 5\' 10"  (1.778 m), weight 193 lb 8 oz (87.8 kg), SpO2 98 %. NED. A&Ox3.   No respiratory distress   Abd soft, NT, ND Normal phallus with bilateral descended testicles  Cystoscopy Procedure Note  Patient identification was confirmed, informed consent was obtained, and patient was prepped using Betadine solution.  Lidocaine jelly was administered per urethral meatus.    Preoperative abx where received prior to procedure.     Pre-Procedure: - Inspection reveals a normal caliber ureteral meatus.  Procedure: The flexible cystoscope was introduced without difficulty-his bladder did appear full on introduction of the cystoscope. - No urethral strictures/lesions are present. - Moderate lateral lobe enlargement prostate  - Mild bladder neck elevation - Bilateral ureteral orifices identified - Bladder mucosa  reveals no ulcers, tumors, or lesions - No bladder stones - No trabeculation  Retroflexion shows no significant abnormalities   Post-Procedure: - Patient tolerated the procedure well  Assessment/ Plan: No mucosal abnormalities identified on cystoscopy.  His bladder was full after voiding upon introduction the cystoscope and a bladder scan performed post cystoscopy was 388 mL.  Will place on tamsulosin and obtain a follow-up nurse visit/bladder scan for PVR in 2 weeks

## 2018-02-15 ENCOUNTER — Encounter: Payer: Self-pay | Admitting: Family Medicine

## 2018-02-15 ENCOUNTER — Ambulatory Visit: Payer: Medicare Other | Admitting: Family Medicine

## 2018-02-15 VITALS — BP 158/77 | HR 60 | Ht 70.0 in

## 2018-02-15 DIAGNOSIS — Z87442 Personal history of urinary calculi: Secondary | ICD-10-CM

## 2018-02-15 DIAGNOSIS — R310 Gross hematuria: Secondary | ICD-10-CM

## 2018-02-15 LAB — BLADDER SCAN AMB NON-IMAGING: Scan Result: 19

## 2018-02-15 NOTE — Progress Notes (Signed)
Patient presents today for a PVR. The result was 70ml. Patient was informed that this is a good number and he will follow up as scheduled.

## 2018-03-06 NOTE — Addendum Note (Signed)
Addendum  created 03/06/18 1320 by Molli Barrows, MD   Attestation recorded in [place], [place] filed

## 2018-03-07 ENCOUNTER — Emergency Department: Payer: Medicare Other

## 2018-03-07 ENCOUNTER — Observation Stay
Admission: EM | Admit: 2018-03-07 | Discharge: 2018-03-08 | Disposition: A | Discharge status: Home / Self Care | Payer: Medicare Other | Attending: Internal Medicine | Admitting: Internal Medicine

## 2018-03-07 ENCOUNTER — Encounter: Payer: Self-pay | Admitting: Intensive Care

## 2018-03-07 ENCOUNTER — Other Ambulatory Visit: Payer: Self-pay

## 2018-03-07 ENCOUNTER — Observation Stay
Admit: 2018-03-07 | Discharge: 2018-03-07 | Disposition: A | Discharge status: Home / Self Care | Payer: Medicare Other | Attending: Internal Medicine | Admitting: Internal Medicine

## 2018-03-07 DIAGNOSIS — Z87891 Personal history of nicotine dependence: Secondary | ICD-10-CM | POA: Diagnosis not present

## 2018-03-07 DIAGNOSIS — I130 Hypertensive heart and chronic kidney disease with heart failure and stage 1 through stage 4 chronic kidney disease, or unspecified chronic kidney disease: Secondary | ICD-10-CM | POA: Insufficient documentation

## 2018-03-07 DIAGNOSIS — Z7901 Long term (current) use of anticoagulants: Secondary | ICD-10-CM | POA: Diagnosis not present

## 2018-03-07 DIAGNOSIS — M1990 Unspecified osteoarthritis, unspecified site: Secondary | ICD-10-CM | POA: Insufficient documentation

## 2018-03-07 DIAGNOSIS — Z881 Allergy status to other antibiotic agents status: Secondary | ICD-10-CM | POA: Diagnosis not present

## 2018-03-07 DIAGNOSIS — Z7982 Long term (current) use of aspirin: Secondary | ICD-10-CM | POA: Diagnosis not present

## 2018-03-07 DIAGNOSIS — K219 Gastro-esophageal reflux disease without esophagitis: Secondary | ICD-10-CM | POA: Insufficient documentation

## 2018-03-07 DIAGNOSIS — J449 Chronic obstructive pulmonary disease, unspecified: Secondary | ICD-10-CM | POA: Diagnosis not present

## 2018-03-07 DIAGNOSIS — Z7984 Long term (current) use of oral hypoglycemic drugs: Secondary | ICD-10-CM | POA: Insufficient documentation

## 2018-03-07 DIAGNOSIS — I495 Sick sinus syndrome: Secondary | ICD-10-CM | POA: Insufficient documentation

## 2018-03-07 DIAGNOSIS — E1122 Type 2 diabetes mellitus with diabetic chronic kidney disease: Secondary | ICD-10-CM | POA: Insufficient documentation

## 2018-03-07 DIAGNOSIS — Z85038 Personal history of other malignant neoplasm of large intestine: Secondary | ICD-10-CM | POA: Insufficient documentation

## 2018-03-07 DIAGNOSIS — I480 Paroxysmal atrial fibrillation: Secondary | ICD-10-CM | POA: Insufficient documentation

## 2018-03-07 DIAGNOSIS — E039 Hypothyroidism, unspecified: Secondary | ICD-10-CM | POA: Diagnosis not present

## 2018-03-07 DIAGNOSIS — I252 Old myocardial infarction: Secondary | ICD-10-CM | POA: Insufficient documentation

## 2018-03-07 DIAGNOSIS — N189 Chronic kidney disease, unspecified: Secondary | ICD-10-CM | POA: Diagnosis not present

## 2018-03-07 DIAGNOSIS — I5032 Chronic diastolic (congestive) heart failure: Secondary | ICD-10-CM | POA: Diagnosis not present

## 2018-03-07 DIAGNOSIS — E1140 Type 2 diabetes mellitus with diabetic neuropathy, unspecified: Secondary | ICD-10-CM | POA: Insufficient documentation

## 2018-03-07 DIAGNOSIS — Z79899 Other long term (current) drug therapy: Secondary | ICD-10-CM | POA: Insufficient documentation

## 2018-03-07 DIAGNOSIS — I200 Unstable angina: Secondary | ICD-10-CM | POA: Diagnosis present

## 2018-03-07 DIAGNOSIS — Z8673 Personal history of transient ischemic attack (TIA), and cerebral infarction without residual deficits: Secondary | ICD-10-CM | POA: Insufficient documentation

## 2018-03-07 DIAGNOSIS — I25110 Atherosclerotic heart disease of native coronary artery with unstable angina pectoris: Secondary | ICD-10-CM | POA: Diagnosis not present

## 2018-03-07 DIAGNOSIS — Z888 Allergy status to other drugs, medicaments and biological substances status: Secondary | ICD-10-CM | POA: Insufficient documentation

## 2018-03-07 DIAGNOSIS — R079 Chest pain, unspecified: Secondary | ICD-10-CM | POA: Diagnosis present

## 2018-03-07 DIAGNOSIS — Z950 Presence of cardiac pacemaker: Secondary | ICD-10-CM | POA: Diagnosis not present

## 2018-03-07 DIAGNOSIS — Z955 Presence of coronary angioplasty implant and graft: Secondary | ICD-10-CM | POA: Insufficient documentation

## 2018-03-07 DIAGNOSIS — R509 Fever, unspecified: Secondary | ICD-10-CM

## 2018-03-07 LAB — TROPONIN I
Troponin I: <0.03 ng/mL (ref <0.03)
Troponin I: <0.03 ng/mL (ref <0.03)

## 2018-03-07 LAB — BASIC METABOLIC PANEL
ANION GAP: 11 (ref 5–15)
BUN: 14 mg/dL (ref 6–20)
CALCIUM: 9.1 mg/dL (ref 8.9–10.3)
CHLORIDE: 101 mmol/L (ref 101–111)
CO2: 26 mmol/L (ref 22–32)
Creatinine, Ser: 0.86 mg/dL (ref 0.61–1.24)
GFR calc non Af Amer: >60 mL/min (ref >60)
GLUCOSE: 194 mg/dL — AB (ref 65–99)
POTASSIUM: 4 mmol/L (ref 3.5–5.1)
Sodium: 138 mmol/L (ref 135–145)

## 2018-03-07 LAB — CBC
HEMATOCRIT: 42.2 % (ref 40.0–52.0)
Hemoglobin: 14.1 g/dL (ref 13.0–18.0)
MCH: 29.2 pg (ref 26.0–34.0)
MCHC: 33.4 g/dL (ref 32.0–36.0)
MCV: 87.5 fL (ref 80.0–100.0)
Platelets: 334 10*3/uL (ref 150–440)
RBC: 4.83 MIL/uL (ref 4.40–5.90)
RDW: 14.7 % — ABNORMAL HIGH (ref 11.5–14.5)
WBC: 10.8 10*3/uL — ABNORMAL HIGH (ref 3.8–10.6)

## 2018-03-07 LAB — PROTIME-INR
INR: 2.24
Prothrombin Time: 24.6 seconds — ABNORMAL HIGH (ref 11.4–15.2)

## 2018-03-07 MED ORDER — GABAPENTIN 300 MG PO CAPS
300.0000 mg | ORAL_CAPSULE | Freq: Three times a day (TID) | ORAL | Status: DC
Start: 2018-03-07 — End: 2018-03-08
  Administered 2018-03-08: 300 mg via ORAL
  Filled 2018-03-07 (×3): qty 1

## 2018-03-07 MED ORDER — CITALOPRAM HYDROBROMIDE 20 MG PO TABS
20.0000 mg | ORAL_TABLET | Freq: Every day | ORAL | Status: DC
  Administered 2018-03-07 – 2018-03-08 (×2): 20 mg via ORAL
  Filled 2018-03-07 (×2): qty 1

## 2018-03-07 MED ORDER — LACTULOSE 10 GM/15ML PO SOLN: 20.0000 g | Freq: Every day | ORAL | Status: DC | PRN

## 2018-03-07 MED ORDER — FAMOTIDINE 20 MG PO TABS
20.0000 mg | ORAL_TABLET | Freq: Every day | ORAL | Status: DC
  Administered 2018-03-07 – 2018-03-08 (×2): 20 mg via ORAL
  Filled 2018-03-07 (×2): qty 1

## 2018-03-07 MED ORDER — ENOXAPARIN SODIUM 40 MG/0.4ML ~~LOC~~ SOLN: 40.0000 mg | SUBCUTANEOUS | Status: DC

## 2018-03-07 MED ORDER — ACETAMINOPHEN 325 MG PO TABS
650.0000 mg | ORAL_TABLET | ORAL | Status: DC | PRN
  Administered 2018-03-07: 650 mg via ORAL
  Filled 2018-03-07: qty 2

## 2018-03-07 MED ORDER — SODIUM CHLORIDE 0.9 % IV BOLUS
500.0000 mL | Freq: Once | INTRAVENOUS | Status: AC
  Administered 2018-03-07: 500 mL via INTRAVENOUS

## 2018-03-07 MED ORDER — ASPIRIN 81 MG PO CHEW
81.0000 mg | CHEWABLE_TABLET | Freq: Every morning | ORAL | Status: DC
  Administered 2018-03-08: 81 mg via ORAL
  Filled 2018-03-07: qty 1

## 2018-03-07 MED ORDER — ONDANSETRON HCL 4 MG PO TABS: 4.0000 mg | ORAL_TABLET | Freq: Three times a day (TID) | ORAL | Status: DC | PRN

## 2018-03-07 MED ORDER — VITAMIN B-12 1000 MCG PO TABS
1000.0000 ug | ORAL_TABLET | Freq: Every day | ORAL | Status: DC
  Administered 2018-03-07 – 2018-03-08 (×2): 1000 ug via ORAL
  Filled 2018-03-07 (×2): qty 1

## 2018-03-07 MED ORDER — MORPHINE SULFATE (PF) 2 MG/ML IV SOLN: 2.0000 mg | INTRAVENOUS | Status: DC | PRN

## 2018-03-07 MED ORDER — GLIPIZIDE 5 MG PO TABS
5.0000 mg | ORAL_TABLET | Freq: Every day | ORAL | Status: DC
  Administered 2018-03-08: 5 mg via ORAL
  Filled 2018-03-07: qty 1

## 2018-03-07 MED ORDER — ONDANSETRON HCL 4 MG/2ML IJ SOLN: 4.0000 mg | Freq: Four times a day (QID) | INTRAMUSCULAR | Status: DC | PRN

## 2018-03-07 MED ORDER — ATORVASTATIN CALCIUM 10 MG PO TABS
10.0000 mg | ORAL_TABLET | Freq: Every day | ORAL | Status: DC
  Administered 2018-03-07: 10 mg via ORAL
  Filled 2018-03-07: qty 1

## 2018-03-07 MED ORDER — MAGNESIUM OXIDE 400 (241.3 MG) MG PO TABS
800.0000 mg | ORAL_TABLET | Freq: Two times a day (BID) | ORAL | Status: DC
  Administered 2018-03-08: 800 mg via ORAL
  Filled 2018-03-07 (×2): qty 2

## 2018-03-07 MED ORDER — MELATONIN 5 MG PO TABS
20.0000 mg | ORAL_TABLET | Freq: Every day | ORAL | Status: DC
  Administered 2018-03-07: 20 mg via ORAL
  Filled 2018-03-07 (×2): qty 4

## 2018-03-07 MED ORDER — TAMSULOSIN HCL 0.4 MG PO CAPS
0.4000 mg | ORAL_CAPSULE | Freq: Every day | ORAL | Status: DC
  Administered 2018-03-08: 0.4 mg via ORAL
  Filled 2018-03-07 (×2): qty 1

## 2018-03-07 MED ORDER — WARFARIN SODIUM 3 MG PO TABS
3.0000 mg | ORAL_TABLET | Freq: Every day | ORAL | Status: DC
  Administered 2018-03-07: 3 mg via ORAL
  Filled 2018-03-07 (×2): qty 1

## 2018-03-07 MED ORDER — B COMPLEX-C PO TABS
1.0000 | ORAL_TABLET | Freq: Every day | ORAL | Status: DC
  Administered 2018-03-08: 1 via ORAL
  Filled 2018-03-07: qty 1

## 2018-03-07 MED ORDER — CALCIUM CARBONATE-VITAMIN D 500-200 MG-UNIT PO TABS
1.0000 | ORAL_TABLET | Freq: Every day | ORAL | Status: DC
  Administered 2018-03-07 – 2018-03-08 (×2): 1 via ORAL
  Filled 2018-03-07 (×2): qty 1

## 2018-03-07 MED ORDER — NITROGLYCERIN 0.4 MG SL SUBL
0.4000 mg | SUBLINGUAL_TABLET | SUBLINGUAL | Status: DC | PRN
  Administered 2018-03-07 (×3): 0.4 mg via SUBLINGUAL
  Filled 2018-03-07: qty 1

## 2018-03-07 MED ORDER — WARFARIN - PHYSICIAN DOSING INPATIENT: Freq: Every day | Status: DC

## 2018-03-07 MED ORDER — LINAGLIPTIN 5 MG PO TABS
5.0000 mg | ORAL_TABLET | Freq: Every day | ORAL | Status: DC
  Administered 2018-03-07 – 2018-03-08 (×2): 5 mg via ORAL
  Filled 2018-03-07 (×2): qty 1

## 2018-03-07 NOTE — Care Management Obs Status (Signed)
[place] NOTIFICATION   Patient Details  Name: [facility] MRN: [id number] Date of Birth: [birth year]   Medicare Observation Status Notification Given:  Yes    Paymon Rosensteel A Rainee Sweatt, RN 03/07/2018, 3:43 PM

## 2018-03-07 NOTE — ED Provider Notes (Signed)
[facility] Emergency Department Provider Note  ____________________________________________  Time seen: Approximately 3:10 PM  I have reviewed the triage vital signs and the nursing notes.   HISTORY  Chief Complaint Chest Pain   HPI Michael Bush is a 77 y.o. male with a history of CAD status post stent, colon cancer in remission, COPD, chronic kidney disease, congestive heart failure with intact EF, atrial fibrillation on Coumadin, pacemaker, hypertension, stroke who presents for evaluation of chest pain.  Patient reports that he was laying in bed this morning when he developed sudden onset of severe sharp chest pain radiating across his entire chest.  The pain was pleuritic in nature.  He took 2 sublingual nitros and 2 baby aspirins.  An hour after taking the nitro he reports that the pain started to decrease and is currently 5 out of 10.  Patient denies ever having similar pain.  Patient reports chronic shortness of breath however he did feel slightly worse during this episode of chest pain.  He has a chronic cough which is slightly worse over the last few days.  No fever or chills, no vomiting, no diarrhea.  Patient denies any personal or family history of blood clots, recent travel immobilization, leg pain or swelling.  He denies any missed Coumadin doses.  The wife reports the patient has had intermittent episodes of blood-streaked sputum for several months however those have become more frequent over the last week.  Past Medical History:  Diagnosis Date  . Arthritis   . CHF (congestive heart failure) ([facility])   . Chronic kidney disease    chronic kidney disease  . Colon cancer ([facility]) 2003   Hx Partial colon resection, chemo + rad tx's.  Marland Kitchen COPD (chronic obstructive pulmonary disease) ([facility])   . Coronary artery disease   . Diabetes mellitus without complication ([facility])   . Dysrhythmia    atrial fib., bradycardia  . GERD (gastroesophageal reflux disease)     . Hypertension   . Hypothyroidism   . Long term (current) use of anticoagulants   . Mixed hearing loss, unilateral    03/07/14  . Myocardial infarction ([place])   . Presence of permanent cardiac pacemaker    [employer] DDDR [phone]  . Pseudophakia of both eyes   . Sick sinus syndrome ([facility]) 04/26/2014  . Stroke [facility])     Patient Active Problem List   Diagnosis Date Noted  . Unstable angina ([place]) 03/07/2018  . Personal history of kidney stones 10/13/2017  . Hydronephrosis 10/13/2017  . Humerus lesion, left 06/01/2017  . Cerebral infarction ([facility]) 07/23/2015  . Cerebrovascular accident (CVA) due to vascular occlusion ([facility]) 07/23/2015  . Chronic obstructive pulmonary disease ([facility]) 03/13/2015  . Kidney stone 11/20/2014  . Acute renal failure (ARF) ([facility]) 10/09/2014  . ARF (acute renal failure) ([facility]) 10/09/2014  . Bladder mass 10/09/2014  . Cardiac pacemaker in situ 04/26/2014  . Bradycardia 04/25/2014  . Chronic suppurative otitis media 03/07/2014  . CAD (coronary artery disease) 01/15/2014  . Atrial fibrillation ([facility]) 12/04/2013    Past Surgical History:  Procedure Laterality Date  . Cardioversion External    . Cataract cortical ssenile    . COLON SURGERY    . COLON SURGERY    . COLONOSCOPY WITH PROPOFOL N/A 01/27/2018   Procedure: COLONOSCOPY WITH PROPOFOL;  Surgeon: Michael Sails, MD;  [facility];  Service: Endoscopy;  Laterality: N/A;  . CORONARY ANGIOPLASTY    . Insertion dual chamber pacemaker generator  Prior to Admission medications   Medication Sig Start Date End Date Taking? Authorizing Provider  acetaminophen (TYLENOL) 325 MG tablet Take by mouth. 10/11/14   [provider]  aspirin 81 MG chewable tablet Chew 81 mg by mouth every morning. 07/02/15   [provider]  atorvastatin (LIPITOR) 10 MG tablet 10 mg at bedtime. 09/14/16   [provider]  B Complex Vitamins (VITAMIN B COMPLEX PO) Take 2 tablets by  mouth daily.    [provider]  Calcium Carb-Ergocalciferol 500-200 MG-UNIT TABS Take 2 tablets by mouth daily.    [provider]  calcium-vitamin D (OSCAL WITH D) 500-200 MG-UNIT TABS tablet Take by mouth.    [provider]  cephALEXin (KEFLEX) 500 MG capsule Take 1 capsule (500 mg total) by mouth 2 (two) times daily. Patient not taking: Reported on 03/07/2018 11/13/17   Zara Council A, PA-C  citalopram (CELEXA) 20 MG tablet Take 20 mg by mouth daily. 03/14/17 03/14/18  [provider]  ELIQUIS 5 MG TABS tablet  12/06/17   [provider]  famotidine (PEPCID) 20 MG tablet Take by mouth. 10/14/17 10/14/18  [provider]  gabapentin (NEURONTIN) 300 MG capsule Take 300 mg by mouth 3 (three) times daily. 08/05/15   [provider]  glipiZIDE (GLUCOTROL) 5 MG tablet  12/08/17   [provider]  lactulose (CEPHULAC) 20 g packet Take 1 packet (20 g total) by mouth daily as needed (constipation). 12/22/17   Schaevitz, Randall An, MD  magnesium oxide (MAG-OX) 400 MG tablet Take 800 mg by mouth 2 (two) times daily.    [provider]  metFORMIN (GLUCOPHAGE) 1000 MG tablet Take 1,000 mg by mouth 2 (two) times daily.    [provider]  metroNIDAZOLE (FLAGYL) 500 MG tablet  10/14/17   [provider]  ondansetron (ZOFRAN) 4 MG tablet Take 1 tablet (4 mg total) by mouth daily as needed. 12/22/17   Orbie Pyo, MD  sitaGLIPtin (JANUVIA) 50 MG tablet Take by mouth. 12/02/16 12/02/17  [provider]  tamsulosin (FLOMAX) 0.4 MG CAPS capsule Take 1 capsule (0.4 mg total) by mouth daily. 02/01/18   Stoioff, Ronda Fairly, MD  vitamin B-12 (CYANOCOBALAMIN) 1000 MCG tablet Take by mouth.    [provider]  warfarin (COUMADIN) 2.5 MG tablet Take 3 mg by mouth daily. 02/24/17   [provider]    Allergies Vancomycin; Dabigatran; Pollen extract; and Pradaxa [dabigatran etexilate  mesylate]  Family History  Problem Relation Age of Onset  . Cancer Father     Social History Social History   Tobacco Use  . Smoking status: Former Smoker    Packs/day: 2.00    Years: 30.00    Pack years: 60.00    Types: Cigarettes  . Smokeless tobacco: Never Used  Substance Use Topics  . Alcohol use: No  . Drug use: No    Review of Systems  Constitutional: Negative for fever. Eyes: Negative for visual changes. ENT: Negative for sore throat. Neck: No neck pain  Cardiovascular: + chest pain. Respiratory: + shortness of breath, cough, hemoptysis Gastrointestinal: Negative for abdominal pain, vomiting or diarrhea. Genitourinary: Negative for dysuria. Musculoskeletal: Negative for back pain. Skin: Negative for rash. Neurological: Negative for headaches, weakness or numbness. Psych: No SI or HI  ____________________________________________   PHYSICAL EXAM:  VITAL SIGNS: ED Triage Vitals  Enc Vitals Group     BP 03/07/18 1340 (!) 149/72     Pulse Rate 03/07/18 1340  66     Resp 03/07/18 1340 19     Temp 03/07/18 1340 99.2 F (37.3 C)     Temp Source 03/07/18 1340 Oral     SpO2 03/07/18 1340 94 %     Weight 03/07/18 1342 198 lb 6.6 oz (90 kg)     Height 03/07/18 1342 5\' 10"  (1.778 m)     Head Circumference --      Peak Flow --      Pain Score 03/07/18 1342 8     Pain Loc --      Pain Edu? --      Excl. in [place]? --     Constitutional: Alert and oriented. Well appearing and in no apparent distress. HEENT:      Head: Normocephalic and atraumatic.         Eyes: Conjunctivae are normal. Sclera is non-icteric.       Mouth/Throat: Mucous membranes are moist.       Neck: Supple with no signs of meningismus. Cardiovascular: Regular rate and rhythm. No murmurs, gallops, or rubs. 2+ symmetrical distal pulses are present in all extremities. No JVD. Respiratory: Normal respiratory effort. Lungs are clear to auscultation bilaterally. No wheezes, crackles, or rhonchi.   Gastrointestinal: Soft, non tender, and non distended with positive bowel sounds. No rebound or guarding. Musculoskeletal: Nontender with normal range of motion in all extremities. No edema, cyanosis, or erythema of extremities. Neurologic: Normal speech and language. Face is symmetric. Moving all extremities. No gross focal neurologic deficits are appreciated. Skin: Skin is warm, dry and intact. No rash noted. Psychiatric: Mood and affect are normal. Speech and behavior are normal.  ____________________________________________   LABS (all labs ordered are listed, but only abnormal results are displayed)  Labs Reviewed  BASIC METABOLIC PANEL - Abnormal; Notable for the following components:      Result Value   Glucose, Bld 194 (*)    All other components within normal limits  CBC - Abnormal; Notable for the following components:   WBC 10.8 (*)    RDW 14.7 (*)    All other components within normal limits  PROTIME-INR - Abnormal; Notable for the following components:   Prothrombin Time 24.6 (*)    All other components within normal limits  TROPONIN I  TROPONIN I  TROPONIN I  TROPONIN I  CBC  CREATININE, SERUM   ____________________________________________  EKG  ED ECG REPORT I, Rudene Re, the attending physician, personally viewed and interpreted this ECG.  Ventricular paced rhythm, rate of 61, prolonged QTC, left axis deviation, no concordant ST elevation or depression. No prior for comparison ____________________________________________  RADIOLOGY  I have personally reviewed the images performed during this visit and I agree with the Radiologist's read.   Interpretation by Radiologist:  Dg Chest 2 View  Result Date: 03/07/2018 CLINICAL DATA:  Chest pain EXAM: CHEST - 2 VIEW COMPARISON:  PET-CT August 07, 2017 FINDINGS: There is no appreciable edema or consolidation. There is mild reticular interstitial disease throughout the lungs. Heart is borderline  enlarged with pulmonary vascularity normal. Pacemaker leads are attached to the right atrium and right ventricle. No pneumothorax. No adenopathy. There is aortic atherosclerosis as well as calcification in the left carotid artery. Bones are osteoporotic.  There is arthropathy in both shoulders. 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 No edema or consolidation evident. Heart mildly enlarged with pacemaker leads attached to right atrium and right ventricle. There is aortic atherosclerosis as well as calcification  in the left carotid artery. Aortic Atherosclerosis (ICD10-I70.0). Electronically Signed   By: Lowella Grip III M.D.   On: 03/07/2018 14:38    ____________________________________________   PROCEDURES  Procedure(s) performed: None Procedures Critical Care performed:  None ____________________________________________   INITIAL IMPRESSION / ASSESSMENT AND PLAN / ED COURSE   77 y.o. male with a history of CAD status post stent, colon cancer in remission, COPD, chronic kidney disease, congestive heart failure with intact EF, atrial fibrillation on Coumadin, pacemaker, hypertension, stroke who presents for evaluation of chest pain.  Pain improved after nitro.  Currently patient has 5 out of 10 pain.  EKG with no ischemic changes.  First troponin is negative.  Patient has received 4 baby aspirins prior to arrival.  Patient is high risk with chest pain and warrants admission for further evaluation.  His last left heart cath was in 2015.  Patient's INR is 2.2 therefore low suspicion for PE at this time.  There is no evidence of pneumonia on chest x-ray and the. Discussed with hospitalist for admission      As part of my medical decision making, I reviewed the following data within the [place] notes reviewed and incorporated, Labs reviewed , EKG interpreted , Old chart reviewed, Radiograph reviewed , Discussed with admitting  physician , Notes from prior ED visits and [place] Controlled Substance Database    Pertinent labs & imaging results that were available during my care of the patient were reviewed by me and considered in my medical decision making (see chart for details).    ____________________________________________   FINAL CLINICAL IMPRESSION(S) / ED DIAGNOSES  Final diagnoses:  Chest pain, unspecified type      NEW MEDICATIONS STARTED DURING THIS VISIT:  ED Discharge Orders    None       Note:  This document was prepared using Dragon voice recognition software and may include unintentional dictation errors.    Alfred Levins, [place], MD 03/07/18 [phone]

## 2018-03-07 NOTE — Care Management Note (Signed)
Case Management Note  Patient Details  Name: [facility] MRN: [id number] Date of Birth: Sep 19, 1941  Subjective/Objective:       Patient admitted to [facility] under observation status for chest pain. Patient lives at home with wife [name] [phone] and daughter is also available for support. Patient uses a walker, cane, shower chair and is currently in the process of obtaining an electric wheelchair. Able to complete activities of daily with family assistance. PCP is Dr Vickki Muff. Obtains medications from [facility] without concern.              Action/Plan:  Will confirm with Corene Cornea from [facility] that patient will receive electric wheelchair.  Expected Discharge Date:                  Expected Discharge Plan:     In-House Referral:     Discharge planning Services     Post Acute Care Choice:    Choice offered to:     DME Arranged:    DME Agency:     HH Arranged:    HH Agency:     Status of Service:     If discussed at [employer], dates discussed:    Additional Comments:  Latanya Maudlin, RN 03/07/2018, 3:44 PM

## 2018-03-07 NOTE — Progress Notes (Signed)
Family Meeting Note  Advance Directive:yes  Today a meeting took place with the Patient, wife and 2 daughters at bedside  Patient is able to participate in the conversation  The following clinical team members were present during this meeting:MD  The following were discussed:Patient's diagnosis: Unstable angina with history of coronary artery disease status post stent placement, chronic atrial fibrillation on Coumadin, other comorbidities as documented below and treatment plan of care discussed in detail with the patient and family members at bedside.  They verbalized understanding of the plan   Arthritis   . CHF (congestive heart failure) ([place])   . Chronic kidney disease    chronic kidney disease  . Colon cancer ([place]) 2003   Hx Partial colon resection, chemo + rad tx's.  Marland Kitchen COPD (chronic obstructive pulmonary disease) ([place])   . Coronary artery disease   . Diabetes mellitus without complication ([place])   . Dysrhythmia    atrial fib., bradycardia  . GERD (gastroesophageal reflux disease)   . Hypertension   . Hypothyroidism   . Long term (current) use of anticoagulants   . Mixed hearing loss, unilateral    03/07/14  . Myocardial infarction ([place])   . Presence of permanent cardiac pacemaker    [employer] DDDR [phone]  . Pseudophakia of both eyes   . Sick sinus syndrome ([place]) 04/26/2014  . Stroke [facility])         patient's progosis: > 12 months and Goals for treatment: Full Code, wife is the healthcare power of attorney  Additional follow-up to be provided: Hospitalist and cardiology  Time spent during discussion:18 min  Nicholes Mango, MD

## 2018-03-07 NOTE — H&P (Signed)
[facility] [place] NAME: Michael Bush    MR#:  [id number]  DATE OF BIRTH:  [birth year]  DATE OF ADMISSION:  03/07/2018  PRIMARY CARE PHYSICIAN: Clarisse Gouge, MD   REQUESTING/REFERRING PHYSICIAN: Rudene Re, MD  CHIEF COMPLAINT:  Chest pain  HISTORY OF PRESENT ILLNESS:  Michael Bush  is a 77 y.o. male with a known history of coronary artery disease status post stent placement by Dr. call wood, currently following with [facility] cardiology, history of congestive heart failure diastolic, chronic atrial fibrillation on Coumadin, pacemaker, COPD, hypertension and other medical problems is presenting to the ED with a chief complaint of chest pain which was started at 5:30 AM today.  Patient was having chest discomfort which was radiating to the bilateral shoulder blades.  At 12:00 patient started developing shortness of breath and took 2 sublingual nitroglycerin with  minimal improvement.  Patient also took 2 baby aspirin and came into the emergency department.  Patient was given another sublingual nitroglycerin in the emergency department and pain significantly improved.  Initial troponin is negative.  EKG with paced rhythm.  Patient's INR is therapeutic at 2.2.  Wife and 2 daughters at bedside  PAST MEDICAL HISTORY:   Past Medical History:  Diagnosis Date  . Arthritis   . CHF (congestive heart failure) ([place])   . Chronic kidney disease    chronic kidney disease  . Colon cancer ([place]) 2003   Hx Partial colon resection, chemo + rad tx's.  Marland Kitchen COPD (chronic obstructive pulmonary disease) ([place])   . Coronary artery disease   . Diabetes mellitus without complication ([place])   . Dysrhythmia    atrial fib., bradycardia  . GERD (gastroesophageal reflux disease)   . Hypertension   . Hypothyroidism   . Long term (current) use of anticoagulants   . Mixed hearing loss, unilateral    03/07/14  . Myocardial infarction ([place])   . Presence of  permanent cardiac pacemaker    [employer] Accolade DDDR [phone]  . Pseudophakia of both eyes   . Sick sinus syndrome ([place]) 04/26/2014  . Stroke [facility])     PAST SURGICAL HISTOIRY:   Past Surgical History:  Procedure Laterality Date  . Cardioversion External    . Cataract cortical ssenile    . COLON SURGERY    . COLON SURGERY    . COLONOSCOPY WITH PROPOFOL N/A 01/27/2018   Procedure: COLONOSCOPY WITH PROPOFOL;  Surgeon: Lollie Sails, MD;  Location: [facility] ENDOSCOPY;  Service: Endoscopy;  Laterality: N/A;  . CORONARY ANGIOPLASTY    . Insertion dual chamber pacemaker generator      SOCIAL HISTORY:   Social History   Tobacco Use  . Smoking status: Former Smoker    Packs/day: 2.00    Years: 30.00    Pack years: 60.00    Types: Cigarettes  . Smokeless tobacco: Never Used  Substance Use Topics  . Alcohol use: No    FAMILY HISTORY:   Family History  Problem Relation Age of Onset  . Cancer Father     DRUG ALLERGIES:   Allergies  Allergen Reactions  . Vancomycin Swelling    Facial swelling  . Dabigatran Other (See Comments)    Bleeding complications Bleeding complications   . Pollen Extract   . Pradaxa [Dabigatran Etexilate Mesylate]     REVIEW OF SYSTEMS:  CONSTITUTIONAL: No fever, fatigue or weakness.  EYES: No blurred or double vision.  EARS, NOSE, AND THROAT: No tinnitus  or ear pain.  RESPIRATORY: No cough, shortness of breath, wheezing or hemoptysis.  CARDIOVASCULAR: No chest pain, orthopnea, edema.  GASTROINTESTINAL: No nausea, vomiting, diarrhea or abdominal pain.  GENITOURINARY: No dysuria, hematuria.  ENDOCRINE: No polyuria, nocturia,  HEMATOLOGY: No anemia, easy bruising or bleeding SKIN: No rash or lesion. MUSCULOSKELETAL: No joint pain or arthritis.   NEUROLOGIC: No tingling, numbness, weakness.  PSYCHIATRY: No anxiety or depression.   MEDICATIONS AT HOME:   Prior to Admission medications   Medication Sig Start Date End Date Taking?  Authorizing Provider  acetaminophen (TYLENOL) 325 MG tablet Take by mouth. 10/11/14   [provider]  aspirin 81 MG chewable tablet Chew 81 mg by mouth every morning. 07/02/15   [provider]  atorvastatin (LIPITOR) 10 MG tablet 10 mg at bedtime. 09/14/16   [provider]  B Complex Vitamins (VITAMIN B COMPLEX PO) Take 2 tablets by mouth daily.    [provider]  Calcium Carb-Ergocalciferol 500-200 MG-UNIT TABS Take 2 tablets by mouth daily.    [provider]  calcium-vitamin D (OSCAL WITH D) 500-200 MG-UNIT TABS tablet Take by mouth.    [provider]  cephALEXin (KEFLEX) 500 MG capsule Take 1 capsule (500 mg total) by mouth 2 (two) times daily. Patient not taking: Reported on 03/07/2018 11/13/17   Zara Council A, PA-C  citalopram (CELEXA) 20 MG tablet Take 20 mg by mouth daily. 03/14/17 03/14/18  [provider]  ELIQUIS 5 MG TABS tablet  12/06/17   [provider]  famotidine (PEPCID) 20 MG tablet Take by mouth. 10/14/17 10/14/18  [provider]  gabapentin (NEURONTIN) 300 MG capsule Take 300 mg by mouth 3 (three) times daily. 08/05/15   [provider]  glipiZIDE (GLUCOTROL) 5 MG tablet  12/08/17   [provider]  lactulose (CEPHULAC) 20 g packet Take 1 packet (20 g total) by mouth daily as needed (constipation). 12/22/17   Schaevitz, Randall An, MD  magnesium oxide (MAG-OX) 400 MG tablet Take 800 mg by mouth 2 (two) times daily.    [provider]  metFORMIN (GLUCOPHAGE) 1000 MG tablet Take 1,000 mg by mouth 2 (two) times daily.    [provider]  metroNIDAZOLE (FLAGYL) 500 MG tablet  10/14/17   [provider]  ondansetron (ZOFRAN) 4 MG tablet Take 1 tablet (4 mg total) by mouth daily as needed. 12/22/17   Orbie Pyo, MD  sitaGLIPtin (JANUVIA) 50 MG tablet Take by mouth. 12/02/16 12/02/17  [provider]  tamsulosin (FLOMAX) 0.4 MG CAPS capsule  Take 1 capsule (0.4 mg total) by mouth daily. 02/01/18   Stoioff, Ronda Fairly, MD  vitamin B-12 (CYANOCOBALAMIN) 1000 MCG tablet Take by mouth.    [provider]  warfarin (COUMADIN) 2.5 MG tablet Take 3 mg by mouth daily. 02/24/17   [provider]      VITAL SIGNS:  Blood pressure 139/68, pulse (!) 59, temperature 99.2 F (37.3 C), temperature source Oral, resp. rate 20, height 5\' 10"  (1.778 m), weight 90 kg (198 lb 6.6 oz), SpO2 95 %.  PHYSICAL EXAMINATION:  GENERAL:  77 y.o.-year-old patient lying in the bed with no acute distress.  EYES: Pupils equal, round, reactive to light and accommodation. No scleral icterus. Extraocular muscles intact.  HEENT: Head atraumatic, normocephalic. Oropharynx and nasopharynx clear.  NECK:  Supple, no jugular venous distention. No thyroid enlargement, no tenderness.  LUNGS: Normal breath sounds bilaterally, no wheezing, rales,rhonchi or crepitation. No use of accessory  muscles of respiration.  CARDIOVASCULAR: S1, S2 normal. No murmurs, rubs, or gallops.  No anterior chest wall tenderness on palpation ABDOMEN: Soft, nontender, nondistended. Bowel sounds present. No organomegaly or mass.  EXTREMITIES: No pedal edema, cyanosis, or clubbing.  NEUROLOGIC: Cranial nerves II through XII are intact. Muscle strength 5/5 in all extremities. Sensation intact. Gait not checked.  PSYCHIATRIC: The patient is alert and oriented x 3.  SKIN: No obvious rash, lesion, or ulcer.   LABORATORY PANEL:   CBC Recent Labs  Lab 03/07/18 1344  WBC 10.8*  HGB 14.1  HCT 42.2  PLT 334   ------------------------------------------------------------------------------------------------------------------  Chemistries  Recent Labs  Lab 03/07/18 1344  NA 138  K 4.0  CL 101  CO2 26  GLUCOSE 194*  BUN 14  CREATININE 0.86  CALCIUM 9.1   ------------------------------------------------------------------------------------------------------------------  Cardiac  Enzymes Recent Labs  Lab 03/07/18 1344  TROPONINI <0.03   ------------------------------------------------------------------------------------------------------------------  RADIOLOGY:  Dg Chest 2 View  Result Date: 03/07/2018 CLINICAL DATA:  Chest pain EXAM: CHEST - 2 VIEW COMPARISON:  PET-CT August 07, 2017 FINDINGS: There is no appreciable edema or consolidation. There is mild reticular interstitial disease throughout the lungs. Heart is borderline enlarged with pulmonary vascularity normal. Pacemaker leads are attached to the right atrium and right ventricle. No pneumothorax. No adenopathy. There is aortic atherosclerosis as well as calcification in the left carotid artery. Bones are osteoporotic.  There is arthropathy in both shoulders. 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 No edema or consolidation evident. Heart mildly enlarged with pacemaker leads attached to right atrium and right ventricle. There is aortic atherosclerosis as well as calcification in the left carotid artery. Aortic Atherosclerosis (ICD10-I70.0). Electronically Signed   By: Lowella Grip III M.D.   On: 03/07/2018 14:38    EKG:   Orders placed or performed during the hospital encounter of 03/07/18  . EKG 12-Lead  . EKG 12-Lead  . ED EKG within 10 minutes  . ED EKG within 10 minutes    IMPRESSION AND PLAN:  Michael Bush  is a 77 y.o. male with a known history of coronary artery disease status post stent placement by Dr. call wood, currently following with [facility] cardiology, history of congestive heart failure diastolic, chronic atrial fibrillation on Coumadin, pacemaker, COPD, hypertension and other medical problems is presenting to the ED with a chief complaint of chest pain which was started at 5:30 AM today.  Patient was having chest discomfort which was radiating to the bilateral shoulder blades.  At 12:00 patient started developing shortness of breath and took 2  sublingual nitroglycerin with  minimal improvement.  Patient also took 2 baby aspirin and came into the emergency department.  #Unstable angina with history of coronary artery disease status post stent placement by Dr. Clayborn Bigness in the past Admit to telemetry Cycle cardiac biomarkers, initial troponin is negative We will get echocardiogram Last cardiac cath was done in 2015\ Continue home medication aspirin 81 mg enteric-coated Check fasting lipid panel continue home medications statin Patient currently is with [facility] cardiology and not on any beta-blocker, will start him on very small dose of beta-blocker and monitor heart rate and blood pressure  #Diabetes mellitus Continue glipizide and Januvia and hold metformin Sliding scale insulin  #Chronic atrial fibrillation rate controlled on Coumadin INR therapeutic patient could not afford Eliquis  #History of CVA continue aspirin and statin  #Diabetic neuropathy continue Neurontin   DVT prophylaxis patient is on Eliquis   All the records  are reviewed and case discussed with ED provider. Management plans discussed with the patient, family and they are in agreement.  CODE STATUS:  fc   TOTAL TIME TAKING CARE OF THIS PATIENT: 41 minutes.   Note: This dictation was prepared with Dragon dictation along with smaller phrase technology. Any transcriptional errors that result from this process are unintentional.  Nicholes Mango M.D on 03/07/2018 at 3:49 PM  Between 7am to 6pm - Pager - [phone]  After 6pm go to [email or web address] - password [email or web address] [facility]  [facility]  Office  [phone]  CC: Primary care physician; Clarisse Gouge, MD

## 2018-03-07 NOTE — ED Triage Notes (Signed)
Patient arrived by EMS from home for substernal CP. EMS administered 324 aspirin and patient took two nitro around noon. HX A-fib and pacemaker. Patients baseline is unable to move L arm and L leg due to previous stroke. Patient c/o 8 out of 10 pain

## 2018-03-08 ENCOUNTER — Observation Stay: Payer: Medicare Other

## 2018-03-08 DIAGNOSIS — R079 Chest pain, unspecified: Secondary | ICD-10-CM | POA: Diagnosis not present

## 2018-03-08 LAB — PROTIME-INR
INR: 2.28
Prothrombin Time: 24.9 seconds — ABNORMAL HIGH (ref 11.4–15.2)

## 2018-03-08 LAB — GLUCOSE, CAPILLARY
GLUCOSE-CAPILLARY: 111 mg/dL — AB (ref 65–99)
Glucose-Capillary: 90 mg/dL (ref 65–99)

## 2018-03-08 LAB — ECHOCARDIOGRAM COMPLETE
HEIGHTINCHES: 70 in
WEIGHTICAEL: 2994.73 [oz_av]

## 2018-03-08 MED ORDER — WARFARIN - PHARMACIST DOSING INPATIENT: Freq: Every day | Status: DC

## 2018-03-08 MED ORDER — NITROGLYCERIN 0.4 MG SL SUBL: 0.4000 mg | SUBLINGUAL_TABLET | SUBLINGUAL | 0 refills | Status: AC | PRN

## 2018-03-08 MED ORDER — INSULIN ASPART 100 UNIT/ML ~~LOC~~ SOLN: 0.0000 [IU] | Freq: Three times a day (TID) | SUBCUTANEOUS | Status: DC

## 2018-03-08 NOTE — Consult Note (Signed)
Cardiology Consultation Note    Patient ID: Michael Bush, MRN: [id number], DOB/AGE: Aug 20, 1941 77 y.o. Admit date: 03/07/2018   Date of Consult: 03/08/2018 Primary Physician: Clarisse Gouge, MD Primary Cardiologist: [facility]  Chief Complaint: right upper pleuritic chest pain Reason for Consultation: chest pain Requesting MD: Dr. mody  HPI: Michael Bush is a 77 y.o. male with history of coronary artery disease status post PCI in 1998, history of chronic kidney disease, diabetes, hypertension as well as atrial fibrillation who was admitted after presenting to the emergency room with complaints of sharp pleuritic right upper chest pain.  States that the pain occurred when breathing deeply or sitting up.  It was not exertional.  It also hurt when moving from side to side.  It was not present with exertion.  He has ruled out for myocardial infarction.  Past Medical History:  Diagnosis Date  . Arthritis   . CHF (congestive heart failure) ([place])   . Chronic kidney disease    chronic kidney disease  . Colon cancer ([employer]) 2003   Hx Partial colon resection, chemo + rad tx's.  Marland Kitchen COPD (chronic obstructive pulmonary disease) ([place])   . Coronary artery disease   . Diabetes mellitus without complication ([place])   . Dysrhythmia    atrial fib., bradycardia  . GERD (gastroesophageal reflux disease)   . Hypertension   . Hypothyroidism   . Long term (current) use of anticoagulants   . Mixed hearing loss, unilateral    03/07/14  . Myocardial infarction ([place])   . Presence of permanent cardiac pacemaker    [employer] DDDR [phone]  . Pseudophakia of both eyes   . Sick sinus syndrome ([place]) 04/26/2014  . Stroke [facility])       Surgical History:  Past Surgical History:  Procedure Laterality Date  . Cardioversion External    . Cataract cortical ssenile    . COLON SURGERY    . COLON SURGERY    . COLONOSCOPY WITH PROPOFOL N/A 01/27/2018   Procedure: COLONOSCOPY WITH PROPOFOL;   Surgeon: Lollie Sails, MD;  Location: [facility] ENDOSCOPY;  Service: Endoscopy;  Laterality: N/A;  . CORONARY ANGIOPLASTY    . Insertion dual chamber pacemaker generator       Home Meds: Prior to Admission medications   Medication Sig Start Date End Date Taking? Authorizing Provider  acetaminophen (TYLENOL) 325 MG tablet Take 650 mg by mouth every 6 (six) hours as needed for mild pain or moderate pain.  10/11/14  Yes [provider]  aspirin 81 MG chewable tablet Chew 81 mg by mouth every morning. 07/02/15  Yes [provider]  atorvastatin (LIPITOR) 10 MG tablet Take 10 mg by mouth at bedtime.    Yes [provider]  B Complex Vitamins (VITAMIN B COMPLEX PO) Take 2 tablets by mouth daily.   Yes [provider]  citalopram (CELEXA) 20 MG tablet Take 20 mg by mouth daily. 03/14/17 03/14/18 Yes [provider]  famotidine (PEPCID) 20 MG tablet Take 20 mg by mouth 2 (two) times daily.  10/14/17 10/14/18 Yes [provider]  glipiZIDE (GLUCOTROL) 5 MG tablet Take 5 mg by mouth 2 (two) times daily.  12/08/17  Yes [provider]  vitamin B-12 (CYANOCOBALAMIN) 1000 MCG tablet Take 1,000 mcg by mouth daily.    Yes [provider]  warfarin (COUMADIN) 3 MG tablet Take 3MG  by mouth daily except on Friday take 5MG  by mouth   Yes [provider]  cephALEXin (KEFLEX) 500 MG capsule Take 1 capsule (500 mg total) by mouth 2 (two) times daily. Patient not taking: Reported on 03/07/2018 11/13/17   Zara Council A, PA-C  gabapentin (NEURONTIN) 300 MG capsule Take 300 mg by mouth 3 (three) times daily. 08/05/15   [provider]  lactulose (CEPHULAC) 20 g packet Take 1 packet (20 g total) by mouth daily as needed (constipation). Patient not taking: Reported on 03/07/2018 12/22/17   Orbie Pyo, MD  ondansetron (ZOFRAN) 4 MG tablet Take 1 tablet (4 mg total) by mouth daily as needed. Patient not taking: Reported on  03/07/2018 12/22/17   Orbie Pyo, MD  tamsulosin (FLOMAX) 0.4 MG CAPS capsule Take 1 capsule (0.4 mg total) by mouth daily. Patient not taking: Reported on 03/07/2018 02/01/18   Abbie Sons, MD    Inpatient Medications:  . aspirin  81 mg Oral q morning - 10a  . atorvastatin  10 mg Oral q1800  . B-complex with vitamin C  1 tablet Oral Daily  . calcium-vitamin D  1 tablet Oral Daily  . citalopram  20 mg Oral Daily  . famotidine  20 mg Oral Daily  . gabapentin  300 mg Oral TID  . glipiZIDE  5 mg Oral QAC breakfast  . insulin aspart  0-9 Units Subcutaneous TID WC  . linagliptin  5 mg Oral Daily  . magnesium oxide  800 mg Oral BID  . Melatonin  20 mg Oral QHS  . tamsulosin  0.4 mg Oral Daily  . vitamin B-12  1,000 mcg Oral Daily  . warfarin  3 mg Oral q1800  . Warfarin - Pharmacist Dosing Inpatient   Does not apply q1800     Allergies:  Allergies  Allergen Reactions  . Vancomycin Swelling    Facial swelling  . Dabigatran Other (See Comments)    Bleeding complications Bleeding complications   . Pollen Extract   . Pradaxa [Dabigatran Etexilate Mesylate]     Social History   Socioeconomic History  . Marital status: Married    Spouse name: Not on file  . Number of children: Not on file  . Years of education: Not on file  . Highest education level: Not on file  Occupational History  . Not on file  Social Needs  . Financial resource strain: Not on file  . Food insecurity:    Worry: Not on file    Inability: Not on file  . Transportation needs:    Medical: Not on file    Non-medical: Not on file  Tobacco Use  . Smoking status: Former Smoker    Packs/day: 2.00    Years: 30.00    Pack years: 60.00    Types: Cigarettes  . Smokeless tobacco: Never Used  Substance and Sexual Activity  . Alcohol use: No  . Drug use: No  . Sexual activity: Not Currently  Lifestyle  . Physical activity:    Days per week: Not on file    Minutes per session: Not on file   . Stress: Not on file  Relationships  . Social connections:    Talks on phone: Not on file    Gets together: Not on file    Attends religious service: Not on file    Active member of club or organization: Not on file    Attends meetings of clubs or organizations: Not on file    Relationship status: Not on file  . Intimate partner violence:    Fear of  current or ex partner: Not on file    Emotionally abused: Not on file    Physically abused: Not on file    Forced sexual activity: Not on file  Other Topics Concern  . Not on file  Social History Narrative  . Not on file     Family History  Problem Relation Age of Onset  . Cancer Father      Review of Systems: A 12-system review of systems was performed and is negative except as noted in the HPI.  Labs: Recent Labs    03/07/18 1344 03/07/18 1631 03/07/18 1834 03/07/18 2100  TROPONINI <0.03 <0.03 <0.03 <0.03   Lab Results  Component Value Date   WBC 10.8 (H) 03/07/2018   HGB 14.1 03/07/2018   HCT 42.2 03/07/2018   MCV 87.5 03/07/2018   PLT 334 03/07/2018    Recent Labs  Lab 03/07/18 1344  NA 138  K 4.0  CL 101  CO2 26  BUN 14  CREATININE 0.86  CALCIUM 9.1  GLUCOSE 194*   No results found for: CHOL, HDL, LDLCALC, TRIG No results found for: DDIMER  Radiology/Studies:  Dg Chest 1 View  Result Date: 03/08/2018 CLINICAL DATA:  Fever, chest pain, inpatient EXAM: CHEST  1 VIEW COMPARISON:  03/07/2018 chest radiograph. FINDINGS: Stable configuration of 2 lead left subclavian pacemaker. Stable cardiomediastinal silhouette with mild cardiomegaly. No pneumothorax. No pleural effusion. No overt pulmonary edema. No acute consolidative airspace disease. IMPRESSION: Stable mild cardiomegaly without overt pulmonary edema. No acute consolidative airspace disease. Electronically Signed   By: Ilona Sorrel M.D.   On: 03/08/2018 09:21   Dg Chest 2 View  Result Date: 03/07/2018 CLINICAL DATA:  Chest pain EXAM: CHEST - 2 VIEW  COMPARISON:  PET-CT August 07, 2017 FINDINGS: There is no appreciable edema or consolidation. There is mild reticular interstitial disease throughout the lungs. Heart is borderline enlarged with pulmonary vascularity normal. Pacemaker leads are attached to the right atrium and right ventricle. No pneumothorax. No adenopathy. There is aortic atherosclerosis as well as calcification in the left carotid artery. Bones are osteoporotic.  There is arthropathy in both shoulders. 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 No edema or consolidation evident. Heart mildly enlarged with pacemaker leads attached to right atrium and right ventricle. There is aortic atherosclerosis as well as calcification in the left carotid artery. Aortic Atherosclerosis (ICD10-I70.0). Electronically Signed   By: Lowella Grip III M.D.   On: 03/07/2018 14:38    Wt Readings from Last 3 Encounters:  03/07/18 84.9 kg (187 lb 2.7 oz)  02/01/18 87.8 kg (193 lb 8 oz)  01/27/18 84.4 kg (186 lb)    EKG: Normal sinus rhythm with no ischemic  Physical Exam: Blood pressure (!) 154/77, pulse (!) 59, temperature 98.3 F (36.8 C), resp. rate 20, height 5\' 10"  (1.778 m), weight 84.9 kg (187 lb 2.7 oz), SpO2 92 %. Body mass index is 26.86 kg/m. General: Well developed, well nourished, in no acute distress. Head: Normocephalic, atraumatic, sclera non-icteric, no xanthomas, nares are without discharge.  Neck: Negative for carotid bruits. JVD not elevated. Lungs: Clear bilaterally to auscultation without wheezes, rales, or rhonchi. Breathing is unlabored. Heart: RRR with S1 S2. No murmurs, rubs, or gallops appreciated. Abdomen: Soft, non-tender, non-distended with normoactive bowel sounds. No hepatomegaly. No rebound/guarding. No obvious abdominal masses. Msk:  Strength and tone appear normal for age. Extremities: No clubbing or cyanosis. No edema.  Distal pedal pulses are 2+ and equal  bilaterally. Neuro: Alert and oriented X 3. No facial asymmetry. No focal deficit. Moves all extremities spontaneously. Psych:  Responds to questions appropriately with a normal affect.     Assessment and Plan  77 year old male followed at [facility] history of sinus syndrome status post permanent pacemaker followed at [facility] who was admitted with chest pain which she described as pleuritic right shoulder pain which occurred only when breathing deeply.  He has ruled out for myocardial infarction.  Symptoms are atypical for ischemia.  Consideration for her admission overnight for out for inpatient functional study versus outpatient follow-up was discussed.  Patient prefers to leave and have it done as an outpatient.  I think this is reasonable since pain was atypical for angina, is not exertional and he is ruled out for myocardial infarction.  We will continue with current meds and follow-up with [facility].  Okay for discharge at present.  Signed, Teodoro Spray MD 03/08/2018, 12:07 PM Pager: [phone]

## 2018-03-08 NOTE — Progress Notes (Signed)
Pt to be discharged to home today. Iv and tele removed. Discharge instructions and prescrip given to pt's family. disch via w.c. Accompanied by family

## 2018-03-08 NOTE — Progress Notes (Signed)
ANTICOAGULATION CONSULT NOTE - Initial Consult  Pharmacy Consult for Warfarin Indication: atrial fibrillation  Allergies  Allergen Reactions  . Vancomycin Swelling    Facial swelling  . Dabigatran Other (See Comments)    Bleeding complications Bleeding complications   . Pollen Extract   . Pradaxa [Dabigatran Etexilate Mesylate]     Patient Measurements: Height: 5\' 10"  (177.8 cm) Weight: 187 lb 2.7 oz (84.9 kg) IBW/kg (Calculated) : 73 Heparin Dosing Weight:    Vital Signs: Temp: 98.3 F (36.8 C) (06/12 0835) Temp Source: Oral (06/12 0335) BP: 154/77 (06/12 0835) Pulse Rate: 59 (06/12 0835)  Labs: Recent Labs    03/07/18 1344 03/07/18 1415 03/07/18 1631 03/07/18 1834 03/07/18 2100 03/08/18 0850  HGB 14.1  --   --   --   --   --   HCT 42.2  --   --   --   --   --   PLT 334  --   --   --   --   --   LABPROT  --  24.6*  --   --   --  24.9*  INR  --  2.24  --   --   --  2.28  CREATININE 0.86  --   --   --   --   --   TROPONINI <0.03  --  <0.03 <0.03 <0.03  --     Estimated Creatinine Clearance: 73.1 mL/min (by C-G formula based on SCr of 0.86 mg/dL).   Medical History: Past Medical History:  Diagnosis Date  . Arthritis   . CHF (congestive heart failure) ([place])   . Chronic kidney disease    chronic kidney disease  . Colon cancer ([place]) 2003   Hx Partial colon resection, chemo + rad tx's.  Marland Kitchen COPD (chronic obstructive pulmonary disease) ([place])   . Coronary artery disease   . Diabetes mellitus without complication ([place])   . Dysrhythmia    atrial fib., bradycardia  . GERD (gastroesophageal reflux disease)   . Hypertension   . Hypothyroidism   . Long term (current) use of anticoagulants   . Mixed hearing loss, unilateral    03/07/14  . Myocardial infarction ([place])   . Presence of permanent cardiac pacemaker    [employer] DDDR [phone]  . Pseudophakia of both eyes   . Sick sinus syndrome ([place]) 04/26/2014  . Stroke [facility])     Medications:   Scheduled:  . aspirin  81 mg Oral q morning - 10a  . atorvastatin  10 mg Oral q1800  . B-complex with vitamin C  1 tablet Oral Daily  . calcium-vitamin D  1 tablet Oral Daily  . citalopram  20 mg Oral Daily  . famotidine  20 mg Oral Daily  . gabapentin  300 mg Oral TID  . glipiZIDE  5 mg Oral QAC breakfast  . insulin aspart  0-9 Units Subcutaneous TID WC  . linagliptin  5 mg Oral Daily  . magnesium oxide  800 mg Oral BID  . Melatonin  20 mg Oral QHS  . tamsulosin  0.4 mg Oral Daily  . vitamin B-12  1,000 mcg Oral Daily  . warfarin  3 mg Oral q1800  . Warfarin - Physician Dosing Inpatient   Does not apply q1800    Assessment: 77 yo M on Warfarin PTA for Afib. Home dose: Warfarin 3 mg all days except Friday, 5 mg on Fridays  Date INR      Dose 6/11 2.24 3  mg 6/12 2.28     Goal of Therapy:  INR 2-3 Monitor platelets by anticoagulation protocol: Yes   Plan:  INR therapeutic. Will continue current Warfarin dosing as per home dose of 3 mg daily- will follow up INR Friday 6/14  to determine if use home dose of 5 mg that day (since both INRs are therapeutic and within 0.2 change) INR on Friday 6/14.  Naeema Patlan A 03/08/2018,10:02 AM

## 2018-03-08 NOTE — Discharge Summary (Signed)
[facility] [place] NAME: Michael Bush    MR#:  [id number]  DATE OF BIRTH:  1940-12-31  DATE OF ADMISSION:  03/07/2018 ADMITTING PHYSICIAN: Nicholes Mango, MD  DATE OF DISCHARGE: 03/08/2018  PRIMARY CARE PHYSICIAN: Clarisse Gouge, MD    ADMISSION DIAGNOSIS:  Chest pain, unspecified type [R07.9]  DISCHARGE DIAGNOSIS:  Active Problems:   Unstable angina ([place])   SECONDARY DIAGNOSIS:   Past Medical History:  Diagnosis Date  . Arthritis   . CHF (congestive heart failure) ([place])   . Chronic kidney disease    chronic kidney disease  . Colon cancer ([place]) 2003   Hx Partial colon resection, chemo + rad tx's.  Marland Kitchen COPD (chronic obstructive pulmonary disease) ([place])   . Coronary artery disease   . Diabetes mellitus without complication ([place])   . Dysrhythmia    atrial fib., bradycardia  . GERD (gastroesophageal reflux disease)   . Hypertension   . Hypothyroidism   . Long term (current) use of anticoagulants   . Mixed hearing loss, unilateral    03/07/14  . Myocardial infarction ([place])   . Presence of permanent cardiac pacemaker    [employer] DDDR [phone]  . Pseudophakia of both eyes   . Sick sinus syndrome ([place]) 04/26/2014  . Stroke [facility])     HOSPITAL COURSE:   77 year old male with CAD status post stent placement in 8338, chronic diastolic heart failure and pacemaker who presented with chest pain.  1.  Chest pain: Patient was ruled out for ACS with negative troponins.  Telemetry and EKG showed no acute changes.  Patient was eval by cardiology.  He had no chest pain during this hospital stay.  Patient will follow-up with his cardiologist Dr. Marlou Sa by the end of the week.  2.  Chronic diastolic heart failure without signs exacerbation:  3.  PAF: Patient will continue Coumadin  4.  BPH: Continue tamsulosin  5.  Diabetes: Patient will continue ADA diet with glipizide  6.  History of CVA: Continue aspirin and  statin DISCHARGE CONDITIONS AND DIET:   Stable for discharge on heart healthy diabetic diet  CONSULTS OBTAINED:  Treatment Team:  Teodoro Spray, MD  DRUG ALLERGIES:   Allergies  Allergen Reactions  . Vancomycin Swelling    Facial swelling  . Dabigatran Other (See Comments)    Bleeding complications Bleeding complications   . Pollen Extract   . Pradaxa [Dabigatran Etexilate Mesylate]     DISCHARGE MEDICATIONS:   Allergies as of 03/08/2018      Reactions   Vancomycin Swelling   Facial swelling   Dabigatran Other (See Comments)   Bleeding complications Bleeding complications   Pollen Extract    Pradaxa [dabigatran Etexilate Mesylate]       Medication List    STOP taking these medications   cephALEXin 500 MG capsule Commonly known as:  KEFLEX     TAKE these medications   acetaminophen 325 MG tablet Commonly known as:  TYLENOL Take 650 mg by mouth every 6 (six) hours as needed for mild pain or moderate pain.   aspirin 81 MG chewable tablet Chew 81 mg by mouth every morning.   atorvastatin 10 MG tablet Commonly known as:  LIPITOR Take 10 mg by mouth at bedtime.   citalopram 20 MG tablet Commonly known as:  CELEXA Take 20 mg by mouth daily.   famotidine 20 MG tablet Commonly known as:  PEPCID Take 20 mg by mouth 2 (  two) times daily.   gabapentin 300 MG capsule Commonly known as:  NEURONTIN Take 300 mg by mouth 3 (three) times daily.   glipiZIDE 5 MG tablet Commonly known as:  GLUCOTROL Take 5 mg by mouth 2 (two) times daily.   lactulose 20 g packet Commonly known as:  CEPHULAC Take 1 packet (20 g total) by mouth daily as needed (constipation).   nitroGLYCERIN 0.4 MG SL tablet Commonly known as:  NITROSTAT Place 1 tablet (0.4 mg total) under the tongue every 5 (five) minutes as needed for chest pain.   ondansetron 4 MG tablet Commonly known as:  ZOFRAN Take 1 tablet (4 mg total) by mouth daily as needed.   tamsulosin 0.4 MG Caps  capsule Commonly known as:  FLOMAX Take 1 capsule (0.4 mg total) by mouth daily.   VITAMIN B COMPLEX PO Take 2 tablets by mouth daily.   vitamin B-12 1000 MCG tablet Commonly known as:  CYANOCOBALAMIN Take 1,000 mcg by mouth daily.   warfarin 3 MG tablet Commonly known as:  COUMADIN Take 3MG  by mouth daily except on Friday take 5MG  by mouth         Today   CHIEF COMPLAINT:   Patient without chest pain overnight and doing well.   VITAL SIGNS:  Blood pressure (!) 154/77, pulse (!) 59, temperature 98.3 F (36.8 C), resp. rate 20, height 5\' 10"  (1.778 m), weight 84.9 kg (187 lb 2.7 oz), SpO2 92 %.   REVIEW OF SYSTEMS:  Review of Systems  Constitutional: Negative.  Negative for chills, fever and malaise/fatigue.  HENT: Negative.  Negative for ear discharge, ear pain, hearing loss, nosebleeds and sore throat.   Eyes: Negative.  Negative for blurred vision and pain.  Respiratory: Negative.  Negative for cough, hemoptysis, shortness of breath and wheezing.   Cardiovascular: Negative.  Negative for chest pain, palpitations and leg swelling.  Gastrointestinal: Negative.  Negative for abdominal pain, blood in stool, diarrhea, nausea and vomiting.  Genitourinary: Negative.  Negative for dysuria.  Musculoskeletal: Negative.  Negative for back pain.  Skin: Negative.   Neurological: Negative for dizziness, tremors, speech change, focal weakness, seizures and headaches.  Endo/Heme/Allergies: Negative.  Does not bruise/bleed easily.  Psychiatric/Behavioral: Negative.  Negative for depression, hallucinations and suicidal ideas.     PHYSICAL EXAMINATION:  GENERAL:  77 y.o.-year-old patient lying in the bed with no acute distress.  NECK:  Supple, no jugular venous distention. No thyroid enlargement, no tenderness.  LUNGS: Normal breath sounds bilaterally, no wheezing, rales,rhonchi  No use of accessory muscles of respiration.  CARDIOVASCULAR: S1, S2 normal. No murmurs, rubs, or  gallops.  ABDOMEN: Soft, non-tender, non-distended. Bowel sounds present. No organomegaly or mass.  EXTREMITIES: No pedal edema, cyanosis, or clubbing.  PSYCHIATRIC: The patient is alert and oriented x 3.  SKIN: No obvious rash, lesion, or ulcer.   DATA REVIEW:   CBC Recent Labs  Lab 03/07/18 1344  WBC 10.8*  HGB 14.1  HCT 42.2  PLT 334    Chemistries  Recent Labs  Lab 03/07/18 1344  NA 138  K 4.0  CL 101  CO2 26  GLUCOSE 194*  BUN 14  CREATININE 0.86  CALCIUM 9.1    Cardiac Enzymes Recent Labs  Lab 03/07/18 1631 03/07/18 1834 03/07/18 2100  TROPONINI <0.03 <0.03 <0.03    Microbiology Results  @[email or web address] @  RADIOLOGY:  Dg Chest 1 View  Result Date: 03/08/2018 CLINICAL DATA:  Fever, chest pain, inpatient EXAM: CHEST  1 VIEW COMPARISON:  03/07/2018 chest radiograph. FINDINGS: Stable configuration of 2 lead left subclavian pacemaker. Stable cardiomediastinal silhouette with mild cardiomegaly. No pneumothorax. No pleural effusion. No overt pulmonary edema. No acute consolidative airspace disease. IMPRESSION: Stable mild cardiomegaly without overt pulmonary edema. No acute consolidative airspace disease. Electronically Signed   By: Ilona Sorrel M.D.   On: 03/08/2018 09:21   Dg Chest 2 View  Result Date: 03/07/2018 CLINICAL DATA:  Chest pain EXAM: CHEST - 2 VIEW COMPARISON:  PET-CT August 07, 2017 FINDINGS: There is no appreciable edema or consolidation. There is mild reticular interstitial disease throughout the lungs. Heart is borderline enlarged with pulmonary vascularity normal. Pacemaker leads are attached to the right atrium and right ventricle. No pneumothorax. No adenopathy. There is aortic atherosclerosis as well as calcification in the left carotid artery. Bones are osteoporotic.  There is arthropathy in both shoulders. 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 No edema or consolidation evident. Heart mildly enlarged with  pacemaker leads attached to right atrium and right ventricle. There is aortic atherosclerosis as well as calcification in the left carotid artery. Aortic Atherosclerosis (ICD10-I70.0). Electronically Signed   By: Lowella Grip III M.D.   On: 03/07/2018 14:38      Allergies as of 03/08/2018      Reactions   Vancomycin Swelling   Facial swelling   Dabigatran Other (See Comments)   Bleeding complications Bleeding complications   Pollen Extract    Pradaxa [dabigatran Etexilate Mesylate]       Medication List    STOP taking these medications   cephALEXin 500 MG capsule Commonly known as:  KEFLEX     TAKE these medications   acetaminophen 325 MG tablet Commonly known as:  TYLENOL Take 650 mg by mouth every 6 (six) hours as needed for mild pain or moderate pain.   aspirin 81 MG chewable tablet Chew 81 mg by mouth every morning.   atorvastatin 10 MG tablet Commonly known as:  LIPITOR Take 10 mg by mouth at bedtime.   citalopram 20 MG tablet Commonly known as:  CELEXA Take 20 mg by mouth daily.   famotidine 20 MG tablet Commonly known as:  PEPCID Take 20 mg by mouth 2 (two) times daily.   gabapentin 300 MG capsule Commonly known as:  NEURONTIN Take 300 mg by mouth 3 (three) times daily.   glipiZIDE 5 MG tablet Commonly known as:  GLUCOTROL Take 5 mg by mouth 2 (two) times daily.   lactulose 20 g packet Commonly known as:  CEPHULAC Take 1 packet (20 g total) by mouth daily as needed (constipation).   nitroGLYCERIN 0.4 MG SL tablet Commonly known as:  NITROSTAT Place 1 tablet (0.4 mg total) under the tongue every 5 (five) minutes as needed for chest pain.   ondansetron 4 MG tablet Commonly known as:  ZOFRAN Take 1 tablet (4 mg total) by mouth daily as needed.   tamsulosin 0.4 MG Caps capsule Commonly known as:  FLOMAX Take 1 capsule (0.4 mg total) by mouth daily.   VITAMIN B COMPLEX PO Take 2 tablets by mouth daily.   vitamin B-12 1000 MCG  tablet Commonly known as:  CYANOCOBALAMIN Take 1,000 mcg by mouth daily.   warfarin 3 MG tablet Commonly known as:  COUMADIN Take 3MG  by mouth daily except on Friday take 5MG  by mouth           Management plans discussed with the patient and he is in agreement. Stable for discharge home  Patient should follow up with pcp  CODE STATUS:     Code Status Orders  (From admission, onward)        Start     Ordered   03/07/18 1520  Full code  Continuous     03/07/18 1522    Code Status History    This patient has a current code status but no historical code status.      TOTAL TIME TAKING CARE OF THIS PATIENT: 38 minutes.    Note: This dictation was prepared with Dragon dictation along with smaller phrase technology. Any transcriptional errors that result from this process are unintentional.  Mikayla Chiusano M.D on 03/08/2018 at 12:11 PM  Between 7am to 6pm - Pager - [phone] After 6pm go to [email or web address] - password [email or web address] [facility]  Office  [phone]  CC: Primary care physician; Clarisse Gouge, MD

## 2018-03-09 ENCOUNTER — Other Ambulatory Visit: Payer: Medicare Other

## 2018-04-04 ENCOUNTER — Ambulatory Visit: Payer: Medicare Other

## 2018-04-10 ENCOUNTER — Ambulatory Visit: Payer: Medicare Other

## 2018-04-12 ENCOUNTER — Encounter: Payer: Self-pay | Admitting: Oncology

## 2018-04-12 ENCOUNTER — Other Ambulatory Visit: Payer: Self-pay

## 2018-04-12 ENCOUNTER — Inpatient Hospital Stay: Payer: Medicare Other | Attending: Oncology | Admitting: Oncology

## 2018-04-12 VITALS — BP 112/73 | HR 60 | Temp 98.6°F | Resp 18 | Wt 195.6 lb

## 2018-04-12 DIAGNOSIS — Z85038 Personal history of other malignant neoplasm of large intestine: Secondary | ICD-10-CM | POA: Diagnosis present

## 2018-04-12 DIAGNOSIS — Z7901 Long term (current) use of anticoagulants: Secondary | ICD-10-CM

## 2018-04-12 DIAGNOSIS — R531 Weakness: Secondary | ICD-10-CM | POA: Insufficient documentation

## 2018-04-12 NOTE — Progress Notes (Addendum)
Hematology/Oncology Follow up note [facility] [phone] [phone]  CONSULT NOTE Patient Care Team: Clarisse Gouge, MD as PCP - General (Family Medicine)  REASON FOR VISIT Follow up for history of colon cancer, pancreatic lesion and left upper extremity lesion.    HISTORY OF PRESENTING ILLNESS:  Michael Bush 77 y.o.  male with past medical history listed as below was referred by Dr. Prescott Parma for evaluation of his recent pathological fracture/lesion of left humerus. Patient is accompanied by his wife and daughter.  Patient had a fall on 05/01/2017 went to emergency room. X-ray initially showed arthritis. Patient continued to have pain and had a CT scan of his left humerus with a chest. CT left upper extremity showed - Nondisplaced pathologic fracture of the proximal left humerus.- Nondisplaced fractures of the posterior 4th-6th ribs.- Soft tissue lesions within the proximal humerus, concerning for metastatic disease.CT chest showed  Acute, minimally displaced left posterior 4-6 rib fractures at the costovertebral junction.-- Transverse fracture of the left proximal humerus.-- No lung consolidation.-- Partially imaged severe left hydronephrosis and cortical atrophy, as seen on prior examinations. Patient has been seen by orthopedic surgeon Dr. Prescott Parma and referred to see me for additional workup.  With further questioning, patient reports that he has a history of colon cancer which was diagnosed back in 2003 at [facility]. Patient and family were told that he has colon cancer. Patient reports that he has received surgery, chemotherapy and radiation therapy, he cannot remember details about the chemotherapy regimen. 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 Maudie Mercury at [facility]. Per care everywhere records, surgical options including pancreaticoduodenectomy for resection. Patient did not follow-up.  Patient tells me that after hearing the extent of surgery, he decided not to have additional surgical workup.     INTERVAL HISTORY Patient presents for follow-up.  Accompanied by wife and daughter.  Problems and complaints are listed as below.  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 #Worsening of left-sided weakness, which started 6 days ago.  Patient complains not able to move left hand and lower extremity.  Chronic left side weakness has worsened He did not seek medical advice until yesterday.  Patient's wife who is patient's caregiver was recently hospitalized for procedure and and did not notice the change until yesterday.  They have contacted patient's primary care physician and is waiting for a CT head image being scheduled.  Concerning for another stroke. denies headache.   #Supratherapeutic INR, patient reports that his INR was recently checked last week and was supratherapeutic at 4.  He used to take Coumadin 5 mg daily now switched to 3 mg daily. Patient told me that he PCP hope to repeat INR and since he is coming to see me, he is wondering if I can check his INR and a fax to his PCP.  #Left upper extremity lesion was previously worked up. PET scan showed no hypermetabolic activity on the proximal third of left humerus.  Repeat CT humerus left without contrast no soft tissue lesion was identified.     ROS:  Review of Systems  Constitutional: Positive for fatigue. Negative for chills, diaphoresis, fever and unexpected weight change.  HENT:   Negative for hearing loss, lump/mass, nosebleeds and sore throat.   Eyes: Negative for eye problems and icterus.  Respiratory: Negative for chest tightness, cough, hemoptysis and wheezing.   Cardiovascular: Negative.  Negative for chest  pain and leg swelling.  Gastrointestinal: Negative for abdominal distention, abdominal pain, blood in stool, nausea and rectal pain.  Endocrine: Negative for hot flashes.  Genitourinary:  Negative for bladder incontinence, difficulty urinating, dysuria, frequency, hematuria and nocturia.   Musculoskeletal: Positive for gait problem. Negative for back pain and myalgias.       Left arm pain.   Skin: Negative for rash.  Neurological: Positive for gait problem. Negative for dizziness, headaches, light-headedness, numbness and seizures.       Left side weakness, worsened   Hematological: Negative for adenopathy. Does not bruise/bleed easily.  Psychiatric/Behavioral: Negative for confusion. The patient is not nervous/anxious.     MEDICAL HISTORY:  Past Medical History:  Diagnosis Date  . Arthritis   . CHF (congestive heart failure) ([place])   . Chronic kidney disease    chronic kidney disease  . Colon cancer ([place]) 2003   Hx Partial colon resection, chemo + rad tx's.  Marland Kitchen COPD (chronic obstructive pulmonary disease) ([place])   . Coronary artery disease   . Diabetes mellitus without complication ([place])   . Dysrhythmia    atrial fib., bradycardia  . GERD (gastroesophageal reflux disease)   . Hypertension   . Hypothyroidism   . Long term (current) use of anticoagulants   . Mixed hearing loss, unilateral    03/07/14  . Myocardial infarction ([place])   . Presence of permanent cardiac pacemaker    [employer] DDDR [id number]  . Pseudophakia of both eyes   . Sick sinus syndrome ([place]) 04/26/2014  . Stroke [facility])     SURGICAL HISTORY: Past Surgical History:  Procedure Laterality Date  . Cardioversion External    . Cataract cortical ssenile    . COLON SURGERY    . COLON SURGERY    . COLONOSCOPY WITH PROPOFOL N/A 01/27/2018   Procedure: COLONOSCOPY WITH PROPOFOL;  Surgeon: Lollie Sails, MD;  Location: [facility] ENDOSCOPY;  Service: Endoscopy;  Laterality: N/A;  . CORONARY ANGIOPLASTY    . Insertion dual chamber pacemaker generator      SOCIAL HISTORY: Social History   Socioeconomic History  . Marital status: Married    Spouse name: Not on file  . Number of children:  Not on file  . Years of education: Not on file  . Highest education level: Not on file  Occupational History  . Not on file  Social Needs  . Financial resource strain: Not on file  . Food insecurity:    Worry: Not on file    Inability: Not on file  . Transportation needs:    Medical: Not on file    Non-medical: Not on file  Tobacco Use  . Smoking status: Former Smoker    Packs/day: 2.00    Years: 30.00    Pack years: 60.00    Types: Cigarettes  . Smokeless tobacco: Never Used  Substance and Sexual Activity  . Alcohol use: No  . Drug use: No  . Sexual activity: Not Currently  Lifestyle  . Physical activity:    Days per week: Not on file    Minutes per session: Not on file  . Stress: Not on file  Relationships  . Social connections:    Talks on phone: Not on file    Gets together: Not on file    Attends religious service: Not on file    Active member of club or organization: Not on file    Attends meetings of clubs or organizations: Not on file  Relationship status: Not on file  . Intimate partner violence:    Fear of current or ex partner: Not on file    Emotionally abused: Not on file    Physically abused: Not on file    Forced sexual activity: Not on file  Other Topics Concern  . Not on file  Social History Narrative  . Not on file    FAMILY HISTORY: Family History  Problem Relation Age of Onset  . Cancer Father     ALLERGIES:  is allergic to vancomycin; dabigatran; pollen extract; and pradaxa [dabigatran etexilate mesylate].  MEDICATIONS:  Current Outpatient Medications  Medication Sig Dispense Refill  . acetaminophen (TYLENOL) 325 MG tablet Take 650 mg by mouth every 6 (six) hours as needed for mild pain or moderate pain.     Marland Kitchen aspirin 81 MG chewable tablet Chew 81 mg by mouth every morning.    Marland Kitchen atorvastatin (LIPITOR) 10 MG tablet Take 10 mg by mouth at bedtime.     . B Complex Vitamins (VITAMIN B COMPLEX PO) Take 2 tablets by mouth daily.    .  famotidine (PEPCID) 20 MG tablet Take 20 mg by mouth 2 (two) times daily.     Marland Kitchen glipiZIDE (GLUCOTROL) 5 MG tablet Take 5 mg by mouth 2 (two) times daily.   2  . lactulose (CEPHULAC) 20 g packet Take 1 packet (20 g total) by mouth daily as needed (constipation). 5 each 0  . nitroGLYCERIN (NITROSTAT) 0.4 MG SL tablet Place 1 tablet (0.4 mg total) under the tongue every 5 (five) minutes as needed for chest pain. 30 tablet 0  . ondansetron (ZOFRAN) 4 MG tablet Take 1 tablet (4 mg total) by mouth daily as needed. 10 tablet 0  . tamsulosin (FLOMAX) 0.4 MG CAPS capsule Take 1 capsule (0.4 mg total) by mouth daily. 30 capsule 3  . vitamin B-12 (CYANOCOBALAMIN) 1000 MCG tablet Take 1,000 mcg by mouth daily.     Marland Kitchen warfarin (COUMADIN) 3 MG tablet Take 3 mg by mouth daily at 6 PM. Take 3MG  by mouth daily except on Friday take 5MG  by mouth    . citalopram (CELEXA) 20 MG tablet Take 20 mg by mouth daily.    Marland Kitchen gabapentin (NEURONTIN) 300 MG capsule Take 300 mg by mouth 3 (three) times daily.     No current facility-administered medications for this visit.       Marland Kitchen  PHYSICAL EXAMINATION: ECOG PERFORMANCE STATUS: 3 - Symptomatic, >50% confined to bed Vitals:   04/12/18 1215  BP: 112/73  Pulse: 60  Resp: 18  Temp: 98.6 F (37 C)  SpO2: 93%   Filed Weights   04/12/18 1215  Weight: 195 lb 9.6 oz (88.7 kg)    Physical Exam  Constitutional: He is oriented to person, place, and time and well-developed, well-nourished, and in no distress. No distress.  HENT:  Head: Normocephalic and atraumatic.  Mouth/Throat: No oropharyngeal exudate.  Eyes: Pupils are equal, round, and reactive to light. EOM are normal. No scleral icterus.  Neck: Normal range of motion. Neck supple.  Cardiovascular: Normal rate and regular rhythm.  No murmur heard. Pulmonary/Chest: Effort normal. No respiratory distress. He has no rales. He exhibits no tenderness.  Abdominal: Soft. He exhibits no distension.  Musculoskeletal:  Normal range of motion. He exhibits no edema.  Neurological: He is alert and oriented to person, place, and time.  Left upper extremity 1/5 left lower extremity 1/5   Skin: Skin is warm and dry. He  is not diaphoretic. No erythema.  Psychiatric: Affect normal.    LABORATORY DATA:  I have reviewed the data as listed Lab Results  Component Value Date   WBC 10.8 (H) 03/07/2018   HGB 14.1 03/07/2018   HCT 42.2 03/07/2018   MCV 87.5 03/07/2018   PLT 334 03/07/2018   Recent Labs    06/02/17 0952 11/11/17 1133 03/07/18 1344  NA 141 138 138  K 4.3 4.0 4.0  CL 104 99* 101  CO2 28 29 26   GLUCOSE 106* 212* 194*  BUN 11 12 14   CREATININE 0.82 1.03 0.86  CALCIUM 9.8 9.1 9.1  GFRNONAA >60 >60 >60  GFRAA >60 >60 >60  PROT 8.4*  --   --   ALBUMIN 4.2  --   --   AST 19  --   --   ALT 13*  --   --   ALKPHOS 84  --   --   BILITOT 0.6  --   --     RADIOGRAPHIC STUDIES: I have personally reviewed the radiological images as listed and agreed with the findings in the report. 05/11/2017 CT left upper extremity showed - Nondisplaced pathologic fracture of the proximal left humerus.- Nondisplaced fractures of the posterior 4th-6th ribs.- Soft tissue lesions within the proximal humerus, concerning for metastatic disease. 05/11/2017 CT chest showed  Acute, minimally displaced left posterior 4-6 rib fractures at the costovertebral junction.-- Transverse fracture of the left proximal humerus.-- No lung consolidation.-- Partially imaged severe left hydronephrosis and cortical atrophy, as seen on prior examinations.   PET scan 06/07/2017 IMPRESSION: 1. Moderately hypermetabolic lesions in both parotid glands. Possibilities include benign lesions such as Warthin's tumor, or malignant deep parotid lymph nodes. The lymph nodes are not overtly enlarged. 2. No hypermetabolic lung lesion. There is bilateral airway thickening with some airway plugging in the right lower lobe, and several calcified granulomas in  the left lung. If there is an outside study that suggests the presence of a lung mass, we are happy to compare to the outside exam if they can be provided. 3. Deformity left proximal humerus which may be from severe degenerative arthropathy or a subtle fracture. Fracture not well seen on today' s exam, the CT data is for PET correlation an localization, and not for diagnostic musculoskeletal fracture assessment. No obvious hypermetabolic activity in the proximal third of the humerus on the left. 4. Healing left rib fractures. 5. 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 6. Aortic Atherosclerosis (ICD10-I70.0). Coronary atherosclerosis with mild cardiomegaly. 7. Severe bilateral degenerative glenohumeral arthropathy. 8. Severe left renal atrophy with prominent left hydronephrosis and hydroureter extending down to the iliac vessel cross over. 9. Postoperative findings in the rectum. Presacral soft tissue density is not hypermetabolic and likely treatment related.  06/02/2017 Normal CEA, PSA, CA19.9.  10/25/2017 CT humerus left wo contrast no soft tissue lesion was identified.    ASSESSMENT & PLAN:  1. Acute left-sided weakness   2. Chronic anticoagulation   3. Malignant neoplasm of colon, unspecified part of colon ([place])     #CT left humerus was independently reviewed and results were previously discussed with patient   Over the phone and today discussed again with patient face-to-face.  No soft tissue lesion was identified.  I think previous findings or acute changes secondary to fracture.  Severe glenohumeral osteoarthritis.  We will hold additional image work-up for this problem.  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  No history of rapid growth or being symptomatic.  Continue to follow-up with ENT Dr. Tami Ribas.  #Acute worsening of chronic left-sided weakness for 7 days.  Concerning for  another stroke. Per patient, Dr.Fowler has ordered a t a CT head for further evaluation. Since he has had symptoms for a weeks, I don't think another STAT CT evaluation is needed here.   Discussed with patient that if he develops headache, or any new neurologic symptoms, he should seek medical advice immediately. I contacted Dr.Foweler's office and update on call physician and Dr. Vickki Muff.    #Supra therapeutic INR, I discussed with patient that I be happy to check his INR if this can facilitate his care with primary care physician.  However, patient was late for his appointment and when we finished, it is  around 1230 already.  Cancer center labs are closed until 1:30pm.  Patient's wife says she is going to contact PCPs office to get the INR done as patient does not want to wait an hour to get labs done here..  # history of colon cancer, treated in [place]. PET scan 06/07/2017 negative # history of pancreatic lesion, negative on pet scan.   All questions were answered. The patient knows to call the clinic with any problems questions or concerns.  Return of visit 1 year Cc Dr.Fowler. Total face to face encounter time for this patient visit was 72min. >50% of the time was  spent in counseling and coordination of care.   Earlie Server, MD, PhD Hematology Oncology [facility] Pager- [phone] 04/12/2018

## 2018-04-12 NOTE — Progress Notes (Signed)
Patient here for follow up.  Patient thinks that he has had another stroke last Wednesday. Wife said that PCP ordered a CT scan and she is waiting for scheduler to call with time and date. Pt also states that Dr. Vickki Muff wants PT/INR checked here and results sent over.

## 2018-04-13 ENCOUNTER — Inpatient Hospital Stay: Payer: Medicare Other | Admitting: Oncology

## 2018-04-21 ENCOUNTER — Other Ambulatory Visit: Payer: Self-pay

## 2018-04-21 ENCOUNTER — Emergency Department: Payer: Medicare Other

## 2018-04-21 ENCOUNTER — Emergency Department
Admission: EM | Admit: 2018-04-21 | Discharge: 2018-04-21 | Disposition: A | Discharge status: Home / Self Care | Payer: Medicare Other | Attending: Emergency Medicine | Admitting: Emergency Medicine

## 2018-04-21 ENCOUNTER — Encounter: Payer: Self-pay | Admitting: Emergency Medicine

## 2018-04-21 DIAGNOSIS — R55 Syncope and collapse: Secondary | ICD-10-CM | POA: Diagnosis not present

## 2018-04-21 DIAGNOSIS — I509 Heart failure, unspecified: Secondary | ICD-10-CM | POA: Insufficient documentation

## 2018-04-21 DIAGNOSIS — Z7901 Long term (current) use of anticoagulants: Secondary | ICD-10-CM | POA: Insufficient documentation

## 2018-04-21 DIAGNOSIS — Y929 Unspecified place or not applicable: Secondary | ICD-10-CM | POA: Diagnosis not present

## 2018-04-21 DIAGNOSIS — Y998 Other external cause status: Secondary | ICD-10-CM | POA: Insufficient documentation

## 2018-04-21 DIAGNOSIS — Y9389 Activity, other specified: Secondary | ICD-10-CM | POA: Insufficient documentation

## 2018-04-21 DIAGNOSIS — I2510 Atherosclerotic heart disease of native coronary artery without angina pectoris: Secondary | ICD-10-CM | POA: Diagnosis not present

## 2018-04-21 DIAGNOSIS — N189 Chronic kidney disease, unspecified: Secondary | ICD-10-CM | POA: Insufficient documentation

## 2018-04-21 DIAGNOSIS — E1122 Type 2 diabetes mellitus with diabetic chronic kidney disease: Secondary | ICD-10-CM | POA: Diagnosis not present

## 2018-04-21 DIAGNOSIS — I130 Hypertensive heart and chronic kidney disease with heart failure and stage 1 through stage 4 chronic kidney disease, or unspecified chronic kidney disease: Secondary | ICD-10-CM | POA: Insufficient documentation

## 2018-04-21 DIAGNOSIS — S42295A Other nondisplaced fracture of upper end of left humerus, initial encounter for closed fracture: Secondary | ICD-10-CM | POA: Diagnosis not present

## 2018-04-21 DIAGNOSIS — S060X1A Concussion with loss of consciousness of 30 minutes or less, initial encounter: Secondary | ICD-10-CM | POA: Insufficient documentation

## 2018-04-21 DIAGNOSIS — W010XXA Fall on same level from slipping, tripping and stumbling without subsequent striking against object, initial encounter: Secondary | ICD-10-CM | POA: Insufficient documentation

## 2018-04-21 DIAGNOSIS — H6123 Impacted cerumen, bilateral: Secondary | ICD-10-CM | POA: Diagnosis not present

## 2018-04-21 DIAGNOSIS — J449 Chronic obstructive pulmonary disease, unspecified: Secondary | ICD-10-CM | POA: Insufficient documentation

## 2018-04-21 DIAGNOSIS — S4992XA Unspecified injury of left shoulder and upper arm, initial encounter: Secondary | ICD-10-CM | POA: Diagnosis present

## 2018-04-21 DIAGNOSIS — E039 Hypothyroidism, unspecified: Secondary | ICD-10-CM | POA: Insufficient documentation

## 2018-04-21 LAB — CBC WITH DIFFERENTIAL/PLATELET
BASOS ABS: 0 10*3/uL (ref 0–0.1)
BASOS PCT: 0 %
EOS ABS: 0.1 10*3/uL (ref 0–0.7)
Eosinophils Relative: 1 %
HCT: 44.3 % (ref 40.0–52.0)
Hemoglobin: 15 g/dL (ref 13.0–18.0)
Lymphocytes Relative: 7 %
Lymphs Abs: 0.8 10*3/uL — ABNORMAL LOW (ref 1.0–3.6)
MCH: 29.4 pg (ref 26.0–34.0)
MCHC: 33.8 g/dL (ref 32.0–36.0)
MCV: 87.2 fL (ref 80.0–100.0)
Monocytes Absolute: 0.5 10*3/uL (ref 0.2–1.0)
Monocytes Relative: 5 %
NEUTROS PCT: 87 %
Neutro Abs: 9.1 10*3/uL — ABNORMAL HIGH (ref 1.4–6.5)
PLATELETS: 226 10*3/uL (ref 150–440)
RBC: 5.08 MIL/uL (ref 4.40–5.90)
RDW: 14.5 % (ref 11.5–14.5)
WBC: 10.5 10*3/uL (ref 3.8–10.6)

## 2018-04-21 LAB — BASIC METABOLIC PANEL
ANION GAP: 12 (ref 5–15)
BUN: 17 mg/dL (ref 8–23)
CHLORIDE: 100 mmol/L (ref 98–111)
CO2: 26 mmol/L (ref 22–32)
Calcium: 9.5 mg/dL (ref 8.9–10.3)
Creatinine, Ser: 1.09 mg/dL (ref 0.61–1.24)
Glucose, Bld: 302 mg/dL — ABNORMAL HIGH (ref 70–99)
POTASSIUM: 4.5 mmol/L (ref 3.5–5.1)
SODIUM: 138 mmol/L (ref 135–145)

## 2018-04-21 LAB — PROTIME-INR
INR: 2.94
PROTHROMBIN TIME: 30.4 s — AB (ref 11.4–15.2)

## 2018-04-21 MED ORDER — OXYCODONE HCL 5 MG PO TABS: 5.0000 mg | ORAL_TABLET | Freq: Three times a day (TID) | ORAL | 0 refills | Status: DC | PRN

## 2018-04-21 MED ORDER — LIDOCAINE VISCOUS HCL 2 % MT SOLN
OROMUCOSAL | Status: AC
  Administered 2018-04-21: 16:00:00
  Filled 2018-04-21: qty 15

## 2018-04-21 NOTE — ED Notes (Signed)
Pt to the er for pain to the right arm after a fall. Pt has arm in a sling. Pt on blood thinners. Hx of FX in left arm last year. Pt doesn't remember falling. Pt has paralysis on on left side from stroke.

## 2018-04-21 NOTE — ED Notes (Signed)
Phone report received from pacemaker company. NSR, no irregularities to report. MD made aware.

## 2018-04-21 NOTE — Discharge Instructions (Signed)
Please use your sling at all times to help with your broken arm and follow-up with orthopedic surgery within 1 week for reevaluation.  Return to the emergency department sooner for any concerns whatsoever.  It was a pleasure to take care of you today, and thank you for coming to our emergency department.  If you have any questions or concerns before leaving please ask the nurse to grab me and I'm more than happy to go through your aftercare instructions again.  If you were prescribed any opioid pain medication today such as Norco, Vicodin, Percocet, morphine, hydrocodone, or oxycodone please make sure you do not drive when you are taking this medication as it can alter your ability to drive safely.  If you have any concerns once you are home that you are not improving or are in fact getting worse before you can make it to your follow-up appointment, please do not hesitate to call 911 and come back for further evaluation.  Darel Hong, MD  Results for orders placed or performed during the hospital encounter of 04/21/18  CBC with Differential  Result Value Ref Range   WBC 10.5 3.8 - 10.6 K/uL   RBC 5.08 4.40 - 5.90 MIL/uL   Hemoglobin 15.0 13.0 - 18.0 g/dL   HCT 44.3 40.0 - 52.0 %   MCV 87.2 80.0 - 100.0 fL   MCH 29.4 26.0 - 34.0 pg   MCHC 33.8 32.0 - 36.0 g/dL   RDW 14.5 11.5 - 14.5 %   Platelets 226 150 - 440 K/uL   Neutrophils Relative % 87 %   Neutro Abs 9.1 (H) 1.4 - 6.5 K/uL   Lymphocytes Relative 7 %   Lymphs Abs 0.8 (L) 1.0 - 3.6 K/uL   Monocytes Relative 5 %   Monocytes Absolute 0.5 0.2 - 1.0 K/uL   Eosinophils Relative 1 %   Eosinophils Absolute 0.1 0 - 0.7 K/uL   Basophils Relative 0 %   Basophils Absolute 0.0 0 - 0.1 K/uL  Basic metabolic panel  Result Value Ref Range   Sodium 138 135 - 145 mmol/L   Potassium 4.5 3.5 - 5.1 mmol/L   Chloride 100 98 - 111 mmol/L   CO2 26 22 - 32 mmol/L   Glucose, Bld 302 (H) 70 - 99 mg/dL   BUN 17 8 - 23 mg/dL   Creatinine, Ser 1.09  0.61 - 1.24 mg/dL   Calcium 9.5 8.9 - 10.3 mg/dL   GFR calc non Af Amer >60 >60 mL/min   GFR calc Af Amer >60 >60 mL/min   Anion gap 12 5 - 15  Protime-INR  Result Value Ref Range   Prothrombin Time 30.4 (H) 11.4 - 15.2 seconds   INR 2.94    Ct Head Wo Contrast  Result Date: 04/21/2018 CLINICAL DATA:  Pain following fall.  History of colon carcinoma EXAM: CT HEAD WITHOUT CONTRAST TECHNIQUE: Contiguous axial images were obtained from the base of the skull through the vertex without intravenous contrast. COMPARISON:  Report of prior brain MRI October 26, 1999 available; images from that study not available. FINDINGS: Brain: There is moderate diffuse atrophy. There is no intracranial mass, hemorrhage, extra-axial fluid collection, or midline shift. There is patchy small vessel disease throughout the centra semiovale bilaterally. There is evidence of a prior small infarct in the inferior, posterior right centrum semiovale. There is evidence of a prior small infarct in the mid right thalamus. There is evidence of a prior small lacunar type infarct in the  mid left cerebellum. No acute appearing infarct is appreciable. Vascular: No hyperdense vessels. There is calcification in each distal vertebral artery and carotid siphon region. Skull: Bony calvarium appears intact. Sinuses/Orbits: There is mucosal thickening in several ethmoid air cells bilaterally. Other visualized paranasal sinuses are clear. Orbits appear symmetric bilaterally. Other: There is opacification in several inferior mastoids on the left. Mastoids elsewhere are clear. There is debris in each external auditory canal. IMPRESSION: Atrophy with patchy periventricular small vessel disease. Prior small infarcts in the inferior, posterior right centrum semiovale, mid right thalamus, and mid left cerebellum. No acute infarct evident. No mass or hemorrhage. There are foci of arterial vascular calcification. There is mucosal thickening in several ethmoid  air cells as well as in several inferior mastoid air cells on the left. There is probable cerumen in each external auditory canal. Electronically Signed   By: Lowella Grip III M.D.   On: 04/21/2018 14:34   Dg Humerus Left  Result Date: 04/21/2018 CLINICAL DATA:  Fall EXAM: LEFT HUMERUS - 2+ VIEW COMPARISON:  CT left humerus 10/25/2017 FINDINGS: Nondisplaced fracture left humeral neck appears acute. Advanced degenerative change in the shoulder joint with subluxation of the joint. IMPRESSION: Nondisplaced fracture left humeral neck Advanced degenerative change left shoulder joint Electronically Signed   By: Franchot Gallo M.D.   On: 04/21/2018 15:01

## 2018-04-21 NOTE — ED Provider Notes (Signed)
[facility] Emergency Department Provider Note  ____________________________________________   First MD Initiated Contact with Patient 04/21/18 1524     (approximate)  I have reviewed the triage vital signs and the nursing notes.   HISTORY  Chief Complaint Fall    HPI Michael Bush is a 77 y.o. male who comes to the emergency department with left arm pain after falling earlier today.  The patient got up out of his chair to go to the bathroom and fell to the ground.  He does not remember what happened.  No urinary fecal incontinence.  He had sudden onset severe pain in his left shoulder thereafter.  He has a long-standing history of weakness in the left arm secondary to his stroke.  His pain is worse with movement improved with rest.  He does not recall any antecedent chest pain or palpitations.  He does have a pacemaker.  He does take Coumadin.    Past Medical History:  Diagnosis Date  . Arthritis   . CHF (congestive heart failure) ([place])   . Chronic kidney disease    chronic kidney disease  . Colon cancer ([place]) 2003   Hx Partial colon resection, chemo + rad tx's.  Marland Kitchen COPD (chronic obstructive pulmonary disease) ([place])   . Coronary artery disease   . Diabetes mellitus without complication ([place])   . Dysrhythmia    atrial fib., bradycardia  . GERD (gastroesophageal reflux disease)   . Hypertension   . Hypothyroidism   . Long term (current) use of anticoagulants   . Mixed hearing loss, unilateral    03/07/14  . Myocardial infarction ([place])   . Presence of permanent cardiac pacemaker    [employer] DDDR [phone]  . Pseudophakia of both eyes   . Sick sinus syndrome ([place]) 04/26/2014  . Stroke [facility])     Patient Active Problem List   Diagnosis Date Noted  . Unstable angina ([place]) 03/07/2018  . Personal history of kidney stones 10/13/2017  . Hydronephrosis 10/13/2017  . Humerus lesion, left 06/01/2017  . Cerebral infarction ([place])  07/23/2015  . Cerebrovascular accident (CVA) due to vascular occlusion ([place]) 07/23/2015  . Chronic obstructive pulmonary disease ([place]) 03/13/2015  . Kidney stone 11/20/2014  . Acute renal failure (ARF) ([place]) 10/09/2014  . ARF (acute renal failure) ([place]) 10/09/2014  . Bladder mass 10/09/2014  . Cardiac pacemaker in situ 04/26/2014  . Bradycardia 04/25/2014  . Chronic suppurative otitis media 03/07/2014  . CAD (coronary artery disease) 01/15/2014  . Atrial fibrillation ([place]) 12/04/2013    Past Surgical History:  Procedure Laterality Date  . Cardioversion External    . Cataract cortical ssenile    . COLON SURGERY    . COLON SURGERY    . COLONOSCOPY WITH PROPOFOL N/A 01/27/2018   Procedure: COLONOSCOPY WITH PROPOFOL;  Surgeon: Lollie Sails, MD;  Location[facility] ENDOSCOPY;  Service: Endoscopy;  Laterality: N/A;  . CORONARY ANGIOPLASTY    . Insertion dual chamber pacemaker generator      Prior to Admission medications   Medication Sig Start Date End Date Taking? Authorizing Provider  acetaminophen (TYLENOL) 325 MG tablet Take 650 mg by mouth every 6 (six) hours as needed for mild pain or moderate pain.  10/11/14   [provider]  aspirin 81 MG chewable tablet Chew 81 mg by mouth every morning. 07/02/15   [provider]  atorvastatin (LIPITOR) 10 MG tablet Take 10 mg by mouth at bedtime.     [provider]  B Complex Vitamins (VITAMIN B COMPLEX PO) Take 2 tablets by mouth daily.    [provider]  citalopram (CELEXA) 20 MG tablet Take 20 mg by mouth daily. 03/14/17 03/14/18  [provider]  famotidine (PEPCID) 20 MG tablet Take 20 mg by mouth 2 (two) times daily.  10/14/17 10/14/18  [provider]  gabapentin (NEURONTIN) 300 MG capsule Take 300 mg by mouth 3 (three) times daily. 08/05/15   [provider]  glipiZIDE (GLUCOTROL) 5 MG tablet Take 5 mg by mouth 2 (two) times daily.  12/08/17   [provider]    lactulose (CEPHULAC) 20 g packet Take 1 packet (20 g total) by mouth daily as needed (constipation). 12/22/17   Schaevitz, Randall An, MD  nitroGLYCERIN (NITROSTAT) 0.4 MG SL tablet Place 1 tablet (0.4 mg total) under the tongue every 5 (five) minutes as needed for chest pain. 03/08/18   Bettey Costa, MD  ondansetron (ZOFRAN) 4 MG tablet Take 1 tablet (4 mg total) by mouth daily as needed. 12/22/17   Schaevitz, Randall An, MD  oxyCODONE (ROXICODONE) 5 MG immediate release tablet Take 1 tablet (5 mg total) by mouth every 8 (eight) hours as needed. 04/21/18 04/21/19  Darel Hong, MD  tamsulosin (FLOMAX) 0.4 MG CAPS capsule Take 1 capsule (0.4 mg total) by mouth daily. 02/01/18   Stoioff, Ronda Fairly, MD  vitamin B-12 (CYANOCOBALAMIN) 1000 MCG tablet Take 1,000 mcg by mouth daily.     [provider]  warfarin (COUMADIN) 3 MG tablet Take 3 mg by mouth daily at 6 PM. Take 3MG  by mouth daily except on Friday take 5MG  by mouth    [provider]    Allergies Vancomycin; Dabigatran; Pollen extract; and Pradaxa [dabigatran etexilate mesylate]  Family History  Problem Relation Age of Onset  . Cancer Father     Social History Social History   Tobacco Use  . Smoking status: Former Smoker    Packs/day: 2.00    Years: 30.00    Pack years: 60.00    Types: Cigarettes  . Smokeless tobacco: Never Used  Substance Use Topics  . Alcohol use: No  . Drug use: No    Review of Systems Constitutional: No fever/chills Eyes: No visual changes. ENT: Positive for ear pain Cardiovascular: Denies chest pain. Respiratory: Denies shortness of breath. Gastrointestinal: No abdominal pain.  No nausea, no vomiting.  No diarrhea.  No constipation. Genitourinary: Negative for dysuria. Musculoskeletal: As of her shoulder pain Skin: Negative for rash. Neurological: Negative for headaches, focal weakness or numbness.   ____________________________________________   PHYSICAL EXAM:  VITAL  SIGNS: ED Triage Vitals  Enc Vitals Group     BP 04/21/18 1404 111/64     Pulse Rate 04/21/18 1404 63     Resp 04/21/18 1404 16     Temp 04/21/18 1404 98.4 F (36.9 C)     Temp Source 04/21/18 1404 Oral     SpO2 04/21/18 1404 97 %     Weight 04/21/18 1407 195 lb (88.5 kg)     Height --      Head Circumference --      Peak Flow --      Pain Score 04/21/18 1407 7     Pain Loc --      Pain Edu? --      Excl. in [place]? --     Constitutional: Alert and oriented x4 pleasant cooperative Eyes: PERRL EOMI. Head: Abrasion to left cheek Nose: No congestion/rhinnorhea.  Mouth/Throat: No trismus bilateral cerumen impaction.  Once removed tympanic membranes were normal Neck: No stridor.   Cardiovascular: Normal rate, regular rhythm. Grossly normal heart sounds.  Good peripheral circulation. Respiratory: Normal respiratory effort.  No retractions. Lungs CTAB and moving good air Gastrointestinal: Soft nontender Musculoskeletal:  Significant discomfort lateral aspect of left shoulder.  Unable to range.  Neurovascularly intact  Neurologic: Chronic weakness in left arm although median ulnar and radial nerves do all fire and have sensation Skin: Abrasion to left cheek Psychiatric: Mood and affect are normal. Speech and behavior are normal.    ____________________________________________   DIFFERENTIAL includes but not limited to  Cardiogenic syncope, vasovagal syncope, dehydration, mechanical fall, arm fracture, intracerebral hemorrhage ____________________________________________   LABS (all labs ordered are listed, but only abnormal results are displayed)  Labs Reviewed  CBC WITH DIFFERENTIAL/PLATELET - Abnormal; Notable for the following components:      Result Value   Neutro Abs 9.1 (*)    Lymphs Abs 0.8 (*)    All other components within normal limits  BASIC METABOLIC PANEL - Abnormal; Notable for the following components:   Glucose, Bld 302 (*)    All other components within  normal limits  PROTIME-INR - Abnormal; Notable for the following components:   Prothrombin Time 30.4 (*)    All other components within normal limits    Lab work reviewed by me with no acute disease noted __________________________________________  EKG   ____________________________________________  RADIOLOGY  X-ray of the left humerus reviewed by me consistent with nondisplaced humeral neck fracture CT scan of the head reviewed by me with no acute disease ____________________________________________   PROCEDURES  Procedure(s) performed: yes  Ceruminosis is noted.  Wax is removed by syringing and manual debridement. Instructions for home care to prevent wax buildup are given.  Ceruminosis is noted.  Wax is removed by syringing and manual debridement. Instructions for home care to prevent wax buildup are given.   Mar[place] KitchenSplint Application Date/Time: 9/62/2297 4:57 PM Performed by: Darel Hong, MD Authorized by: Darel Hong, MD   Consent:    Consent obtained:  Verbal   Consent given by:  Patient   Risks discussed:  Numbness and pain   Alternatives discussed:  Alternative treatment Pre-procedure details:    Sensation:  Normal Procedure details:    Laterality:  Left   Location:  Shoulder   Shoulder:  L shoulder   Strapping: yes     Supplies:  Sling Post-procedure details:    Pain:  Improved   Patient tolerance of procedure:  Tolerated well, no immediate complications    Critical Care performed: no  ____________________________________________   INITIAL IMPRESSION / ASSESSMENT AND PLAN / ED COURSE  Pertinent labs & imaging results that were available during my care of the patient were reviewed by me and considered in my medical decision making (see chart for details).   As part of my medical decision making, I reviewed the following data within the [place] History obtained from family if available, nursing notes, old chart and ekg, as  well as notes from prior ED visits.  The patient arrives obviously uncomfortable appearing holding his left shoulder.  X-ray shows a nondisplaced fracture.  Etiology of his syncope is broad and fortunately he has a pacemaker so we can interrogate it.  In the meantime I noted bilateral cerumen impaction and disimpacted both ears with significant improvement in his baseline dizziness and he can now hear much better.  Disposition pending results of pacemaker  interrogation.  The pacemaker was interrogated and that he has had no ectopy.  He feels improved and very well could have been just dehydrated.  Lab work is reassuring and his pain is adequately controlled after new splint.  I will discharge him home with a short course of oxycodone and refer him to orthopedic surgery for further evaluation and treatment.  He verbalizes understanding and agreement with the plan and is discharged home with his family.      ____________________________________________   FINAL CLINICAL IMPRESSION(S) / ED DIAGNOSES  Final diagnoses:  Syncope, unspecified syncope type  Concussion with loss of consciousness of 30 minutes or less, initial encounter  Other closed nondisplaced fracture of proximal end of left humerus, initial encounter  Bilateral impacted cerumen      NEW MEDICATIONS STARTED DURING THIS VISIT:  Discharge Medication List as of 04/21/2018  4:59 PM    START taking these medications   Details  oxyCODONE (ROXICODONE) 5 MG immediate release tablet Take 1 tablet (5 mg total) by mouth every 8 (eight) hours as needed., Starting Fri 04/21/2018, Until Sat 04/21/2019, Print         Note:  This document was prepared using Dragon voice recognition software and may include unintentional dictation errors.     Darel Hong, MD 04/23/18 1245

## 2018-04-21 NOTE — ED Triage Notes (Addendum)
Pt to ED via POV, pt fell today after standing up and landed on left arm. Pt states that the pain in his arm feels like it did when he broke it. Pt currently has arm in sling. Pt had TIA 2 weeks ago and was seen at [facility]. Pt currently has in home PT. Pt states that he does not remember getting up or falling today. Pt is A & O x 4. Pt has abrasion to the left cheek bone. Pt is in NAD at this time.   Pt is on Coumadin, last checked on Friday, INR was 3.1

## 2018-06-19 ENCOUNTER — Encounter: Payer: Self-pay | Admitting: Urology

## 2018-06-19 ENCOUNTER — Ambulatory Visit (INDEPENDENT_AMBULATORY_CARE_PROVIDER_SITE_OTHER): Payer: Medicare Other | Admitting: Urology

## 2018-06-19 VITALS — BP 152/84 | HR 61 | Wt 193.0 lb

## 2018-06-19 DIAGNOSIS — N1330 Unspecified hydronephrosis: Secondary | ICD-10-CM | POA: Diagnosis not present

## 2018-06-19 DIAGNOSIS — R339 Retention of urine, unspecified: Secondary | ICD-10-CM

## 2018-06-19 DIAGNOSIS — R310 Gross hematuria: Secondary | ICD-10-CM | POA: Diagnosis not present

## 2018-06-19 LAB — MICROSCOPIC EXAMINATION
Epithelial Cells (non renal): NONE SEEN /hpf (ref 0–10)
RBC, UA: NONE SEEN /hpf (ref 0–2)

## 2018-06-19 LAB — URINALYSIS, COMPLETE
Bilirubin, UA: NEGATIVE
Ketones, UA: NEGATIVE
Nitrite, UA: POSITIVE — AB
PH UA: 5.5 (ref 5.0–7.5)
Specific Gravity, UA: 1.015 (ref 1.005–1.030)
Urobilinogen, Ur: 0.2 mg/dL (ref 0.2–1.0)

## 2018-06-19 LAB — BLADDER SCAN AMB NON-IMAGING

## 2018-06-19 MED ORDER — TAMSULOSIN HCL 0.4 MG PO CAPS: 0.4000 mg | ORAL_CAPSULE | Freq: Every day | ORAL | 11 refills | Status: DC

## 2018-06-19 NOTE — Progress Notes (Signed)
06/19/2018 1:32 PM   Michael Bush Michael Bush [birth year] [id number]  Referring provider: Clarisse Gouge, MD [address] [place], [place] [address]  Chief Complaint  Patient presents with  . Hematuria    HPI: 77 year old male with a history of stone disease and hematuria.  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  He was last seen May 2019 for cystoscopy and no significant abnormalities were noted however his PVR was 388 mL.  He was started on tamsulosin and patient states his voiding symptoms improved.  He ran out of the medication approximately 2 weeks ago and does feel like he has worsening symptoms.  He denies dysuria or recurrent gross hematuria.  He denies flank, abdominal, pelvic or scrotal pain.   PMH: Past Medical History:  Diagnosis Date  . Arthritis   . CHF (congestive heart failure) ([place])   . Chronic kidney disease    chronic kidney disease  . Colon cancer ([place]) 2003   Hx Partial colon resection, chemo + rad tx's.  Marland Kitchen COPD (chronic obstructive pulmonary disease) ([place])   . Coronary artery disease   . Diabetes mellitus without complication ([place])   . Dysrhythmia    atrial fib., bradycardia  . GERD (gastroesophageal reflux disease)   . Hypertension   . Hypothyroidism   . Long term (current) use of anticoagulants   . Mixed hearing loss, unilateral    03/07/14  . Myocardial infarction ([place])   . Presence of permanent cardiac pacemaker    [employer] DDDR [phone]  . Pseudophakia of both eyes   . Sick sinus syndrome ([place]) 04/26/2014  . Stroke [facility])     Surgical History: Past Surgical History:  Procedure Laterality Date  . Cardioversion External    . Cataract cortical ssenile    . COLON SURGERY    . COLON SURGERY    . COLONOSCOPY WITH PROPOFOL N/A 01/27/2018   Procedure: COLONOSCOPY WITH PROPOFOL;  Surgeon: Lollie Sails, MD;  Location[facility] ENDOSCOPY;  Service: Endoscopy;  Laterality: N/A;  . CORONARY  ANGIOPLASTY    . Insertion dual chamber pacemaker generator      Home Medications:  Allergies as of 06/19/2018      Reactions   Vancomycin Swelling   Facial swelling   Dabigatran Other (See Comments)   Bleeding complications Bleeding complications   Pollen Extract    Pradaxa [dabigatran Etexilate Mesylate]       Medication List        Accurate as of 06/19/18  1:32 PM. Always use your most recent med list.          acetaminophen 325 MG tablet Commonly known as:  TYLENOL Take 650 mg by mouth every 6 (six) hours as needed for mild pain or moderate pain.   aspirin 81 MG chewable tablet Chew 81 mg by mouth every morning.   atorvastatin 40 MG tablet Commonly known as:  LIPITOR TK 1 T PO D   Calcium Carb-Ergocalciferol 500-200 MG-UNIT Tabs Take by mouth.   citalopram 20 MG tablet Commonly known as:  CELEXA Take 20 mg by mouth daily.   famotidine 20 MG tablet Commonly known as:  PEPCID Take 20 mg by mouth 2 (two) times daily.   FIFTY50 PEN NEEDLES 31G X 8 MM Misc Generic drug:  Insulin Pen Needle Use as directed   gabapentin 300 MG capsule Commonly known as:  NEURONTIN Take 300 mg by mouth 3 (three) times daily.   glipiZIDE 5 MG tablet  Commonly known as:  GLUCOTROL Take 5 mg by mouth 2 (two) times daily.   HYDROcodone-acetaminophen 7.5-325 MG tablet Commonly known as:  NORCO TK 1 T PO Q 6 H PRN P   insulin aspart 100 UNIT/ML FlexPen Commonly known as:  NOVOLOG Take Insulin as directed by sliding scale   ketotifen 0.025 % ophthalmic solution Commonly known as:  ZADITOR INT 1 GTT IN OU BID FOR ALLERGIES OR ITCHING   KP FISH OIL 1200 MG Caps Take by mouth.   lactulose 20 g packet Commonly known as:  CEPHULAC Take 1 packet (20 g total) by mouth daily as needed (constipation).   NEOMYCIN-POLYMYXIN-HYDROCORTISONE 1 % Soln OTIC solution Commonly known as:  CORTISPORIN   nitroGLYCERIN 0.4 MG SL tablet Commonly known as:  NITROSTAT Place 1 tablet (0.4 mg  total) under the tongue every 5 (five) minutes as needed for chest pain.   ondansetron 4 MG disintegrating tablet Commonly known as:  ZOFRAN-ODT   ondansetron 4 MG tablet Commonly known as:  ZOFRAN Take 1 tablet (4 mg total) by mouth daily as needed.   oxyCODONE 5 MG immediate release tablet Commonly known as:  Oxy IR/ROXICODONE Take 1 tablet (5 mg total) by mouth every 8 (eight) hours as needed.   tamsulosin 0.4 MG Caps capsule Commonly known as:  FLOMAX Take 1 capsule (0.4 mg total) by mouth daily.   tiZANidine 2 MG tablet Commonly known as:  ZANAFLEX   VITAMIN B COMPLEX PO Take 2 tablets by mouth daily.   vitamin B-12 1000 MCG tablet Commonly known as:  CYANOCOBALAMIN Take 1,000 mcg by mouth daily.   warfarin 3 MG tablet Commonly known as:  COUMADIN Take 3 mg by mouth daily at 6 PM. Take 3MG  by mouth daily except on Friday take 5MG  by mouth       Allergies:  Allergies  Allergen Reactions  . Vancomycin Swelling    Facial swelling  . Dabigatran Other (See Comments)    Bleeding complications Bleeding complications   . Pollen Extract   . Pradaxa [Dabigatran Etexilate Mesylate]     Family History: Family History  Problem Relation Age of Onset  . Cancer Father     Social History:  reports that he has quit smoking. His smoking use included cigarettes. He has a 60.00 pack-year smoking history. He has never used smokeless tobacco. He reports that he does not drink alcohol or use drugs.  ROS: UROLOGY Frequent Urination?: No Hard to postpone urination?: No Burning/pain with urination?: No Get up at night to urinate?: Yes Leakage of urine?: No Urine stream starts and stops?: No Trouble starting stream?: No Do you have to strain to urinate?: No Blood in urine?: No Urinary tract infection?: No Sexually transmitted disease?: No Injury to kidneys or bladder?: No Painful intercourse?: No Weak stream?: No Erection problems?: No Penile pain?:  No  Gastrointestinal Nausea?: No Vomiting?: No Indigestion/heartburn?: No Diarrhea?: No Constipation?: No  Constitutional Fever: No Night sweats?: No Weight loss?: Yes Fatigue?: Yes  Skin Skin rash/lesions?: No Itching?: No  Eyes Blurred vision?: Yes Double vision?: No  Ears/Nose/Throat Sore throat?: No Sinus problems?: No  Hematologic/Lymphatic Swollen glands?: No Easy bruising?: Yes  Cardiovascular Leg swelling?: No Chest pain?: No  Respiratory Cough?: No Shortness of breath?: Yes  Endocrine Excessive thirst?: No  Musculoskeletal Back pain?: Yes Joint pain?: Yes  Neurological Headaches?: No Dizziness?: Yes  Psychologic Depression?: Yes Anxiety?: Yes  Physical Exam: BP (!) 152/84 (BP Location: Left Arm, Patient Position: Sitting, Cuff  Size: Large)   Pulse 61   Wt 193 lb (87.5 kg) Comment: Per Pt  BMI 27.69 kg/m   Constitutional:  Alert and oriented, No acute distress. HEENT: [place] AT, moist mucus membranes.  Trachea midline, no masses. Cardiovascular: No clubbing, cyanosis, or edema. Respiratory: Normal respiratory effort, no increased work of breathing. GI: Abdomen is soft, nontender, nondistended, no abdominal masses GU: No CVA tenderness Lymph: No cervical or inguinal lymphadenopathy. Skin: No rashes, bruises or suspicious lesions. Neurologic: Grossly intact, no focal deficits, moving all 4 extremities. Psychiatric: Normal mood and affect.   Assessment & Plan:   PVR by bladder scan today was better at 178 mL.  He had a creatinine in July 2019 which was 1.09 ( > 60 GFR).  Urinalysis today showed 3+ leukocytes and is nitrite positive with pyuria however he is asymptomatic.  A culture was ordered but will not treat if he remains asymptomatic.  Tamsulosin was refilled.  Recommend a follow-up visit in 6 months.  Return in about 6 months (around 12/18/2018) for Recheck.   Abbie Sons, [place] [address], [place], [place] [address] [phone]

## 2018-06-21 LAB — CULTURE, URINE COMPREHENSIVE

## 2018-07-11 ENCOUNTER — Ambulatory Visit
Admission: RE | Admit: 2018-07-11 | Discharge: 2018-07-11 | Disposition: A | Discharge status: Home / Self Care | Payer: Medicare Other | Source: Ambulatory Visit | Attending: Unknown Physician Specialty | Admitting: Unknown Physician Specialty

## 2018-07-11 DIAGNOSIS — D110 Benign neoplasm of parotid gland: Secondary | ICD-10-CM | POA: Diagnosis not present

## 2018-07-12 DIAGNOSIS — R1312 Dysphagia, oropharyngeal phase: Secondary | ICD-10-CM | POA: Insufficient documentation

## 2018-07-26 DIAGNOSIS — R531 Weakness: Secondary | ICD-10-CM | POA: Insufficient documentation

## 2018-07-27 DIAGNOSIS — N3000 Acute cystitis without hematuria: Secondary | ICD-10-CM | POA: Insufficient documentation

## 2018-08-23 ENCOUNTER — Encounter: Payer: Self-pay | Admitting: Urology

## 2018-08-23 ENCOUNTER — Encounter

## 2018-08-23 ENCOUNTER — Ambulatory Visit (INDEPENDENT_AMBULATORY_CARE_PROVIDER_SITE_OTHER): Payer: Medicare Other | Admitting: Urology

## 2018-08-23 VITALS — BP 148/80 | HR 60

## 2018-08-23 DIAGNOSIS — R339 Retention of urine, unspecified: Secondary | ICD-10-CM | POA: Diagnosis not present

## 2018-08-23 LAB — URINALYSIS, COMPLETE
Bilirubin, UA: NEGATIVE
KETONES UA: NEGATIVE
Leukocytes, UA: NEGATIVE
Nitrite, UA: NEGATIVE
RBC, UA: NEGATIVE
SPEC GRAV UA: 1.02 (ref 1.005–1.030)
Urobilinogen, Ur: 0.2 mg/dL (ref 0.2–1.0)
pH, UA: 5 (ref 5.0–7.5)

## 2018-08-23 LAB — MICROSCOPIC EXAMINATION
Epithelial Cells (non renal): NONE SEEN /hpf (ref 0–10)
RBC, UA: NONE SEEN /hpf (ref 0–2)
WBC, UA: NONE SEEN /hpf (ref 0–5)

## 2018-08-23 LAB — BLADDER SCAN AMB NON-IMAGING

## 2018-08-23 NOTE — Progress Notes (Signed)
08/23/2018 8:46 AM   Michael Bush [name] 1941/05/20 [id number]  Referring provider: Clarisse Gouge, MD [address] [place], [place] [address]  Chief Complaint  Patient presents with  . Follow-up  . Urinary Retention    HPI: 77 year old male followed for history of stone disease.  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  He has a history of BPH and incomplete bladder emptying.  He had a residual of 338 mL in May 2019 and a follow-up PVR was 19 mL.  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  He was found to be in retention during that hospitalization and states that catheter was not placed but intermittent catheterization was performed every 6 hours.  He apparently was emptying better at discharge however once he got home has had recurrent hesitancy and overflow incontinence.  He was treated for Citrobacter UTI in the hospital.  He had a repeat UA at his PCP yesterday which was unremarkable.  PVR by bladder scan today was 444 mL.   PMH: Past Medical History:  Diagnosis Date  . Arthritis   . CHF (congestive heart failure) ([place])   . Chronic kidney disease    chronic kidney disease  . Colon cancer ([place]) 2003   Hx Partial colon resection, chemo + rad tx's.  Marland Kitchen COPD (chronic obstructive pulmonary disease) ([place])   . Coronary artery disease   . Diabetes mellitus without complication ([place])   . Dysrhythmia    atrial fib., bradycardia  . GERD (gastroesophageal reflux disease)   . Hypertension   . Hypothyroidism   . Long term (current) use of anticoagulants   . Mixed hearing loss, unilateral    03/07/14  . Myocardial infarction ([place])   . Presence of permanent cardiac pacemaker    [employer] Accolade DDDR [phone]  . Pseudophakia of both eyes   . Sick sinus syndrome ([place]) 04/26/2014  . Stroke [facility])     Surgical History: Past Surgical History:  Procedure Laterality Date  . Cardioversion  External    . Cataract cortical ssenile    . COLON SURGERY    . COLON SURGERY    . COLONOSCOPY WITH PROPOFOL N/A 01/27/2018   Procedure: COLONOSCOPY WITH PROPOFOL;  Surgeon: Lollie Sails, MD;  Location: [facility] ENDOSCOPY;  Service: Endoscopy;  Laterality: N/A;  . CORONARY ANGIOPLASTY    . Insertion dual chamber pacemaker generator      Home Medications:  Allergies as of 08/23/2018      Reactions   Vancomycin Swelling   Facial swelling   Dabigatran Other (See Comments)   Bleeding complications Bleeding complications   Pollen Extract    Pradaxa [dabigatran Etexilate Mesylate]       Medication List        Accurate as of 08/23/18  8:46 AM. Always use your most recent med list.          acetaminophen 325 MG tablet Commonly known as:  TYLENOL Take 650 mg by mouth every 6 (six) hours as needed for mild pain or moderate pain.   aspirin 81 MG chewable tablet Chew 81 mg by mouth every morning.   atorvastatin 40 MG tablet Commonly known as:  LIPITOR TK 1 T PO D   Calcium Carb-Ergocalciferol 500-200 MG-UNIT Tabs Take by mouth.   citalopram 20 MG tablet Commonly known as:  CELEXA Take 20 mg by mouth daily.   famotidine 20 MG tablet Commonly known as:  PEPCID  Take 20 mg by mouth 2 (two) times daily.   FIFTY50 PEN NEEDLES 31G X 8 MM Misc Generic drug:  Insulin Pen Needle Use as directed   gabapentin 300 MG capsule Commonly known as:  NEURONTIN Take 300 mg by mouth 3 (three) times daily.   glipiZIDE 5 MG tablet Commonly known as:  GLUCOTROL Take 5 mg by mouth 2 (two) times daily.   HYDROcodone-acetaminophen 7.5-325 MG tablet Commonly known as:  NORCO TK 1 T PO Q 6 H PRN P   insulin aspart 100 UNIT/ML FlexPen Commonly known as:  NOVOLOG Take Insulin as directed by sliding scale   ketotifen 0.025 % ophthalmic solution Commonly known as:  ZADITOR INT 1 GTT IN OU BID FOR ALLERGIES OR ITCHING   KP FISH OIL 1200 MG Caps Take by mouth.   lactulose 20 g  packet Commonly known as:  CEPHULAC Take 1 packet (20 g total) by mouth daily as needed (constipation).   NEOMYCIN-POLYMYXIN-HYDROCORTISONE 1 % Soln OTIC solution Commonly known as:  CORTISPORIN   nitroGLYCERIN 0.4 MG SL tablet Commonly known as:  NITROSTAT Place 1 tablet (0.4 mg total) under the tongue every 5 (five) minutes as needed for chest pain.   ondansetron 4 MG disintegrating tablet Commonly known as:  ZOFRAN-ODT   ondansetron 4 MG tablet Commonly known as:  ZOFRAN Take 1 tablet (4 mg total) by mouth daily as needed.   tamsulosin 0.4 MG Caps capsule Commonly known as:  FLOMAX Take 1 capsule (0.4 mg total) by mouth daily.   tiZANidine 2 MG tablet Commonly known as:  ZANAFLEX   VITAMIN B COMPLEX PO Take 2 tablets by mouth daily.   vitamin B-12 1000 MCG tablet Commonly known as:  CYANOCOBALAMIN Take 1,000 mcg by mouth daily.   warfarin 3 MG tablet Commonly known as:  COUMADIN Take 3 mg by mouth daily at 6 PM. Take 3MG  by mouth daily except on Friday take 5MG  by mouth       Allergies:  Allergies  Allergen Reactions  . Vancomycin Swelling    Facial swelling  . Dabigatran Other (See Comments)    Bleeding complications Bleeding complications   . Pollen Extract   . Pradaxa [Dabigatran Etexilate Mesylate]     Family History: Family History  Problem Relation Age of Onset  . Cancer Father     Social History:  reports that he has quit smoking. His smoking use included cigarettes. He has a 60.00 pack-year smoking history. He has never used smokeless tobacco. He reports that he does not drink alcohol or use drugs.  ROS: UROLOGY Frequent Urination?: Yes Hard to postpone urination?: Yes Burning/pain with urination?: No Get up at night to urinate?: No Leakage of urine?: Yes Urine stream starts and stops?: No Trouble starting stream?: No Do you have to strain to urinate?: Yes Blood in urine?: No Urinary tract infection?: Yes Sexually transmitted  disease?: No Injury to kidneys or bladder?: No Painful intercourse?: No Weak stream?: No Erection problems?: No Penile pain?: No  Gastrointestinal Nausea?: No Vomiting?: No Indigestion/heartburn?: No Diarrhea?: Yes Constipation?: No  Constitutional Fever: No Night sweats?: No Weight loss?: Yes Fatigue?: No  Skin Skin rash/lesions?: No Itching?: No  Eyes Blurred vision?: Yes Double vision?: No  Ears/Nose/Throat Sore throat?: No Sinus problems?: No  Hematologic/Lymphatic Swollen glands?: No Easy bruising?: Yes  Cardiovascular Leg swelling?: Yes Chest pain?: No  Respiratory Cough?: No Shortness of breath?: Yes  Endocrine Excessive thirst?: No  Musculoskeletal Back pain?: No Joint pain?: No  Neurological Headaches?: No Dizziness?: No  Psychologic Depression?: Yes Anxiety?: Yes  Physical Exam: BP (!) 148/80 (BP Location: Left Arm, Patient Position: Sitting, Cuff Size: Normal)   Pulse 60   Constitutional:  Alert and oriented, No acute distress. HEENT: [place] AT, moist mucus membranes.  Trachea midline, no masses. Cardiovascular: No clubbing, cyanosis, or edema. Respiratory: Normal respiratory effort, no increased work of breathing. GI: Abdomen is soft, nontender, nondistended, no abdominal masses GU: No CVA tenderness Lymph: No cervical or inguinal lymphadenopathy. Skin: No rashes, bruises or suspicious lesions. Neurologic: Grossly intact, no focal deficits, moving all 4 extremities. Psychiatric: Normal mood and affect.   Assessment & Plan:   77 year old male with urinary retention.  This may have been exacerbated by his recent CVA.  I recommended placement of a Foley catheter for 7-10 days and a follow-up visit next week with catheter removal and instruction in intermittent catheterization.  He was in agreement with this plan.  Urinalysis today showed no evidence of infection.   Abbie Sons, [place] [address], [place], [place] [address] [phone]

## 2018-08-23 NOTE — Progress Notes (Signed)
Simple Catheter Placement  Due to urinary retention patient is present today for a foley cath placement.  Patient was cleaned and prepped in a sterile fashion with betadine.  A 16 FR Coude foley catheter was inserted, urine return was noted  256ml, urine was dark yellow in color.  The balloon was filled with 10cc of sterile water.  A night bag was attached for drainage at patient's request, patient's spouse was instructed on how to empty. Patient was given instruction on proper catheter care.  Patient tolerated well, no complications were noted   Preformed by: Gordy Clement, CMA   Additional notes/ Follow up: RTC in 1wk for cath removal

## 2018-08-30 ENCOUNTER — Encounter: Payer: Self-pay | Admitting: Urology

## 2018-08-30 ENCOUNTER — Ambulatory Visit (INDEPENDENT_AMBULATORY_CARE_PROVIDER_SITE_OTHER): Payer: Medicare Other | Admitting: Urology

## 2018-08-30 VITALS — BP 136/81 | HR 60

## 2018-08-30 DIAGNOSIS — R339 Retention of urine, unspecified: Secondary | ICD-10-CM

## 2018-08-30 NOTE — Progress Notes (Signed)
08/30/2018 11:35 AM  Michael Bush [name] 1941/04/03 [id number]  Referring provider: Clarisse Gouge, MD [address] [place], [place] [address]  Chief Complaint  Patient presents with  . Follow-up  . possible cath removal    HPI: Patient is a 77 y.o. male with a history of chronic left hydronephrosis, atrophic kidney, BPH with incomplete bladder emptying. He presents today with his wife to discuss his options for incomplete bladder emptying.  Patient and wife express that he is still experiencing incomplete bladder emptying. Pt reports nocturia and denies suprapubic pain, hematuria, dysuria flank pain and associated urinary symptoms. Pt wife expressed that he has lost 34lbs since his last hospital visit (07/27/2018).  BPH with incomplete bladder emptying His PVR was 338 mL in May 2019 and a follow-up PVR in May as well was 19 mL.  He was admitted to [facility] and late October with a CVA with left-sided hemiparesis.  He was found to be in retention during that hospitalization and states that catheter was not placed but intermittent catheterization was performed every 6 hours.  He apparently was emptying better at discharge however once he got home has had recurrent hesitancy and overflow incontinence.  His PVR by bladder scan on 08/23/2018 was 444 mL  His wife, [name], was instructed on CIC while he was in the hospital and feels comfortable with the process.   PMH: Past Medical History:  Diagnosis Date  . Arthritis   . CHF (congestive heart failure) ([place])   . Chronic kidney disease    chronic kidney disease  . Colon cancer ([place]) 2003   Hx Partial colon resection, chemo + rad tx's.  Marland Kitchen COPD (chronic obstructive pulmonary disease) ([place])   . Coronary artery disease   . Diabetes mellitus without complication ([place])   . Dysrhythmia    atrial fib., bradycardia  . GERD (gastroesophageal reflux disease)   . Hypertension   . Hypothyroidism   . Long term  (current) use of anticoagulants   . Mixed hearing loss, unilateral    03/07/14  . Myocardial infarction ([place])   . Presence of permanent cardiac pacemaker    [employer] Accolade DDDR [phone]  . Pseudophakia of both eyes   . Sick sinus syndrome ([place]) 04/26/2014  . Stroke [facility])    Surgical History: Past Surgical History:  Procedure Laterality Date  . Cardioversion External    . Cataract cortical ssenile    . COLON SURGERY    . COLON SURGERY    . COLONOSCOPY WITH PROPOFOL N/A 01/27/2018   Procedure: COLONOSCOPY WITH PROPOFOL;  Surgeon: Lollie Sails, MD;  Location: [facility] ENDOSCOPY;  Service: Endoscopy;  Laterality: N/A;  . CORONARY ANGIOPLASTY    . Insertion dual chamber pacemaker generator      Home Medications:  Allergies as of 08/30/2018      Reactions   Vancomycin Swelling   Facial swelling   Dabigatran Other (See Comments)   Bleeding complications Bleeding complications   Pollen Extract    Pradaxa [dabigatran Etexilate Mesylate]       Medication List        Accurate as of 08/30/18 11:35 AM. Always use your most recent med list.          acetaminophen 325 MG tablet Commonly known as:  TYLENOL Take 650 mg by mouth every 6 (six) hours as needed for mild pain or moderate pain.   aspirin 81 MG chewable tablet Chew 81 mg by mouth every morning.   atorvastatin  40 MG tablet Commonly known as:  LIPITOR TK 1 T PO D   Calcium Carb-Ergocalciferol 500-200 MG-UNIT Tabs Take by mouth.   citalopram 20 MG tablet Commonly known as:  CELEXA Take 20 mg by mouth daily.   famotidine 20 MG tablet Commonly known as:  PEPCID Take 20 mg by mouth 2 (two) times daily.   FIFTY50 PEN NEEDLES 31G X 8 MM Misc Generic drug:  Insulin Pen Needle Use as directed   glipiZIDE 5 MG tablet Commonly known as:  GLUCOTROL Take 5 mg by mouth 2 (two) times daily.   insulin aspart 100 UNIT/ML FlexPen Commonly known as:  NOVOLOG Take Insulin as directed by sliding scale   ketotifen  0.025 % ophthalmic solution Commonly known as:  ZADITOR INT 1 GTT IN OU BID FOR ALLERGIES OR ITCHING   KP FISH OIL 1200 MG Caps Take by mouth.   lactulose 20 g packet Commonly known as:  CEPHULAC Take 1 packet (20 g total) by mouth daily as needed (constipation).   NEOMYCIN-POLYMYXIN-HYDROCORTISONE 1 % Soln OTIC solution Commonly known as:  CORTISPORIN   nitroGLYCERIN 0.4 MG SL tablet Commonly known as:  NITROSTAT Place 1 tablet (0.4 mg total) under the tongue every 5 (five) minutes as needed for chest pain.   tamsulosin 0.4 MG Caps capsule Commonly known as:  FLOMAX Take 1 capsule (0.4 mg total) by mouth daily.   tiZANidine 2 MG tablet Commonly known as:  ZANAFLEX   VITAMIN B COMPLEX PO Take 2 tablets by mouth daily.   vitamin B-12 1000 MCG tablet Commonly known as:  CYANOCOBALAMIN Take 1,000 mcg by mouth daily.   warfarin 3 MG tablet Commonly known as:  COUMADIN Take 3 mg by mouth daily at 6 PM. Take 3MG  by mouth daily except on Friday take 5MG  by mouth      Allergies:  Allergies  Allergen Reactions  . Vancomycin Swelling    Facial swelling  . Dabigatran Other (See Comments)    Bleeding complications Bleeding complications   . Pollen Extract   . Pradaxa [Dabigatran Etexilate Mesylate]    Family History: Family History  Problem Relation Age of Onset  . Cancer Father    Social History:  reports that he has quit smoking. His smoking use included cigarettes. He has a 60.00 pack-year smoking history. He has never used smokeless tobacco. He reports that he does not drink alcohol or use drugs.  ROS: UROLOGY Frequent Urination?: No Hard to postpone urination?: No Burning/pain with urination?: No Get up at night to urinate?: No Leakage of urine?: No Urine stream starts and stops?: No Trouble starting stream?: No Do you have to strain to urinate?: No Blood in urine?: No Urinary tract infection?: No Sexually transmitted disease?: No Injury to kidneys or  bladder?: No Painful intercourse?: No Weak stream?: No Erection problems?: No Penile pain?: No  Gastrointestinal Nausea?: No Vomiting?: No Indigestion/heartburn?: No Diarrhea?: No Constipation?: No  Constitutional Fever: No Night sweats?: No Weight loss?: Yes Fatigue?: No  Skin Skin rash/lesions?: No Itching?: No  Eyes Blurred vision?: Yes Double vision?: No  Ears/Nose/Throat Sore throat?: Yes Sinus problems?: No  Hematologic/Lymphatic Swollen glands?: No Easy bruising?: No  Cardiovascular Leg swelling?: Yes Chest pain?: No  Respiratory Cough?: No Shortness of breath?: No  Endocrine Excessive thirst?: No  Musculoskeletal Back pain?: Yes Joint pain?: No  Neurological Headaches?: No Dizziness?: No  Psychologic Depression?: Yes Anxiety?: Yes  Physical Exam: BP 136/81 (BP Location: Left Arm, Patient Position: Sitting)  Pulse 60   Constitutional: Well nourished. Alert and oriented, No acute distress.  In a wheelchair.  Left arm paralysis.  HEENT: [place] AT, moist mucus membranes. Trachea midline, no masses. Cardiovascular: No clubbing, cyanosis, or edema. Respiratory: Normal respiratory effort, no increased work of breathing. GI: Abdomen is soft, non tender, non distended, no abdominal masses. Liver and spleen not palpable.  No hernias appreciated.  Stool sample for occult testing is not indicated.   GU: No CVA tenderness.  No bladder fullness or masses.  Patient with circumcised phallus.  Urethral meatus is patent.  No penile discharge. No penile lesions or rashes.  Skin: No rashes, bruises or suspicious lesions. Psychiatric: Normal mood and affect.  Procedure Catheter Removal Patient is present today for a catheter removal.  10 ml of water was drained from the balloon. A 16 FR foley cath was removed from the bladder no complications were noted . Patient tolerated well.  Preformed by: Zara Council, PA-C    Assessment & Plan:    1. BPH with  incomplete bladder emptying - Offered the patient self-catheterization education and he expressed willingness as his wife knows how to.  - Patient prefers to urinate w/o catheter whenever possible and denies any associated pain without urination. Recommended that patient utilize intermittent catheterizing and monitor output. Would like the patient to be cathed every two hours while awake if he has not urinated on his own and he may increase cathing intervals if PVRs < 200 mL.  Patient expressed willingness and understanding.  - Discussed the possibility of sleep apnea and offered patient complete sleep study and if needed be fit for CPAP, patient declines at this time. - Catheter removed today. Sent home with variety pack of straight cath equipment. - RTC in 1 month for PVR   Khy Pitre, Gordan Payment  [place] [address], [place], [place] [address] [phone]  I, Temidayo Atanda-[place] , am acting as a Education administrator for Nori Riis, PA-C  I have reviewed the above documentation for accuracy and completeness, and I agree with the above.    Zara Council, PA-C

## 2018-09-11 ENCOUNTER — Other Ambulatory Visit: Payer: Self-pay | Admitting: Unknown Physician Specialty

## 2018-09-11 DIAGNOSIS — K118 Other diseases of salivary glands: Secondary | ICD-10-CM

## 2018-09-15 ENCOUNTER — Ambulatory Visit: Payer: Medicare Other | Admitting: Urology

## 2018-10-06 ENCOUNTER — Ambulatory Visit: Payer: Medicare Other | Admitting: Urology
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
## 2018-10-10 ENCOUNTER — Ambulatory Visit (INDEPENDENT_AMBULATORY_CARE_PROVIDER_SITE_OTHER): Payer: Medicare Other | Admitting: Urology

## 2018-10-10 ENCOUNTER — Encounter: Payer: Self-pay | Admitting: Urology

## 2018-10-10 VITALS — BP 186/92 | HR 60 | Ht 70.0 in

## 2018-10-10 DIAGNOSIS — N1330 Unspecified hydronephrosis: Secondary | ICD-10-CM | POA: Diagnosis not present

## 2018-10-10 DIAGNOSIS — Z87442 Personal history of urinary calculi: Secondary | ICD-10-CM | POA: Diagnosis not present

## 2018-10-10 DIAGNOSIS — R339 Retention of urine, unspecified: Secondary | ICD-10-CM

## 2018-10-10 LAB — BLADDER SCAN AMB NON-IMAGING: SCAN RESULT: 16

## 2018-10-11 ENCOUNTER — Ambulatory Visit: Payer: Medicare Other

## 2018-10-11 ENCOUNTER — Emergency Department
Admission: EM | Admit: 2018-10-11 | Discharge: 2018-10-11 | Disposition: A | Discharge status: Home / Self Care | Payer: No Typology Code available for payment source | Attending: Emergency Medicine | Admitting: Emergency Medicine

## 2018-10-11 ENCOUNTER — Emergency Department: Payer: No Typology Code available for payment source

## 2018-10-11 ENCOUNTER — Encounter: Payer: Self-pay | Admitting: Emergency Medicine

## 2018-10-11 DIAGNOSIS — I509 Heart failure, unspecified: Secondary | ICD-10-CM | POA: Diagnosis not present

## 2018-10-11 DIAGNOSIS — Y92410 Unspecified street and highway as the place of occurrence of the external cause: Secondary | ICD-10-CM | POA: Insufficient documentation

## 2018-10-11 DIAGNOSIS — Z85038 Personal history of other malignant neoplasm of large intestine: Secondary | ICD-10-CM | POA: Insufficient documentation

## 2018-10-11 DIAGNOSIS — Z7901 Long term (current) use of anticoagulants: Secondary | ICD-10-CM | POA: Diagnosis not present

## 2018-10-11 DIAGNOSIS — Z87891 Personal history of nicotine dependence: Secondary | ICD-10-CM | POA: Insufficient documentation

## 2018-10-11 DIAGNOSIS — M7918 Myalgia, other site: Secondary | ICD-10-CM | POA: Insufficient documentation

## 2018-10-11 DIAGNOSIS — J449 Chronic obstructive pulmonary disease, unspecified: Secondary | ICD-10-CM | POA: Diagnosis not present

## 2018-10-11 DIAGNOSIS — Y998 Other external cause status: Secondary | ICD-10-CM | POA: Diagnosis not present

## 2018-10-11 DIAGNOSIS — E039 Hypothyroidism, unspecified: Secondary | ICD-10-CM | POA: Diagnosis not present

## 2018-10-11 DIAGNOSIS — Z794 Long term (current) use of insulin: Secondary | ICD-10-CM | POA: Insufficient documentation

## 2018-10-11 DIAGNOSIS — N189 Chronic kidney disease, unspecified: Secondary | ICD-10-CM | POA: Diagnosis not present

## 2018-10-11 DIAGNOSIS — I130 Hypertensive heart and chronic kidney disease with heart failure and stage 1 through stage 4 chronic kidney disease, or unspecified chronic kidney disease: Secondary | ICD-10-CM | POA: Insufficient documentation

## 2018-10-11 DIAGNOSIS — E1122 Type 2 diabetes mellitus with diabetic chronic kidney disease: Secondary | ICD-10-CM | POA: Insufficient documentation

## 2018-10-11 DIAGNOSIS — Y9389 Activity, other specified: Secondary | ICD-10-CM | POA: Diagnosis not present

## 2018-10-11 DIAGNOSIS — S0101XA Laceration without foreign body of scalp, initial encounter: Secondary | ICD-10-CM | POA: Diagnosis not present

## 2018-10-11 DIAGNOSIS — Z7982 Long term (current) use of aspirin: Secondary | ICD-10-CM | POA: Insufficient documentation

## 2018-10-11 DIAGNOSIS — Z79899 Other long term (current) drug therapy: Secondary | ICD-10-CM | POA: Diagnosis not present

## 2018-10-11 DIAGNOSIS — I252 Old myocardial infarction: Secondary | ICD-10-CM | POA: Insufficient documentation

## 2018-10-11 DIAGNOSIS — H538 Other visual disturbances: Secondary | ICD-10-CM | POA: Insufficient documentation

## 2018-10-11 DIAGNOSIS — S0990XA Unspecified injury of head, initial encounter: Secondary | ICD-10-CM | POA: Diagnosis present

## 2018-10-11 LAB — CBC WITH DIFFERENTIAL/PLATELET
ABS IMMATURE GRANULOCYTES: 0.04 10*3/uL (ref 0.00–0.07)
Basophils Absolute: 0 10*3/uL (ref 0.0–0.1)
Basophils Relative: 1 %
Eosinophils Absolute: 0.2 10*3/uL (ref 0.0–0.5)
Eosinophils Relative: 2 %
HCT: 45 % (ref 39.0–52.0)
HEMOGLOBIN: 14.5 g/dL (ref 13.0–17.0)
Immature Granulocytes: 1 %
Lymphocytes Relative: 12 %
Lymphs Abs: 0.9 10*3/uL (ref 0.7–4.0)
MCH: 28.7 pg (ref 26.0–34.0)
MCHC: 32.2 g/dL (ref 30.0–36.0)
MCV: 88.9 fL (ref 80.0–100.0)
Monocytes Absolute: 0.6 10*3/uL (ref 0.1–1.0)
Monocytes Relative: 8 %
Neutro Abs: 5.7 10*3/uL (ref 1.7–7.7)
Neutrophils Relative %: 76 %
Platelets: 229 10*3/uL (ref 150–400)
RBC: 5.06 MIL/uL (ref 4.22–5.81)
RDW: 14.3 % (ref 11.5–15.5)
WBC: 7.5 10*3/uL (ref 4.0–10.5)
nRBC: 0 % (ref 0.0–0.2)

## 2018-10-11 LAB — BASIC METABOLIC PANEL
Anion gap: 8 (ref 5–15)
BUN: 14 mg/dL (ref 8–23)
CO2: 24 mmol/L (ref 22–32)
Calcium: 8.8 mg/dL — ABNORMAL LOW (ref 8.9–10.3)
Chloride: 106 mmol/L (ref 98–111)
Creatinine, Ser: 0.92 mg/dL (ref 0.61–1.24)
GFR calc Af Amer: >60 mL/min (ref >60)
GFR calc non Af Amer: >60 mL/min (ref >60)
GLUCOSE: 159 mg/dL — AB (ref 70–99)
POTASSIUM: 3.9 mmol/L (ref 3.5–5.1)
Sodium: 138 mmol/L (ref 135–145)

## 2018-10-11 LAB — PROTIME-INR
INR: 1.65
Prothrombin Time: 19.3 seconds — ABNORMAL HIGH (ref 11.4–15.2)

## 2018-10-11 MED ORDER — BACITRACIN ZINC 500 UNIT/GM EX OINT
TOPICAL_OINTMENT | Freq: Once | CUTANEOUS | Status: AC
  Administered 2018-10-11: 1 via TOPICAL
  Filled 2018-10-11: qty 0.9

## 2018-10-11 MED ORDER — LIDOCAINE HCL (PF) 1 % IJ SOLN
5.0000 mL | Freq: Once | INTRAMUSCULAR | Status: AC
  Administered 2018-10-11: 5 mL
  Filled 2018-10-11: qty 5

## 2018-10-11 NOTE — ED Notes (Signed)
Returned from CT.
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
## 2018-10-11 NOTE — Discharge Instructions (Addendum)
Please seek medical attention for any high fevers, chest pain, shortness of breath, change in behavior, persistent vomiting, bloody stool or any other new or concerning symptoms.  

## 2018-10-11 NOTE — ED Notes (Signed)
ED Provider at bedside. 

## 2018-10-11 NOTE — ED Notes (Signed)
Pt assisted with urinal at this time. Peri-care performed and pt repositioned in bed.
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
## 2018-10-12 ENCOUNTER — Emergency Department
Admission: EM | Admit: 2018-10-12 | Discharge: 2018-10-12 | Disposition: A | Discharge status: Home / Self Care | Payer: No Typology Code available for payment source | Attending: Emergency Medicine | Admitting: Emergency Medicine

## 2018-10-12 ENCOUNTER — Emergency Department: Payer: No Typology Code available for payment source

## 2018-10-12 ENCOUNTER — Encounter: Payer: Self-pay | Admitting: Emergency Medicine

## 2018-10-12 DIAGNOSIS — Z79899 Other long term (current) drug therapy: Secondary | ICD-10-CM | POA: Diagnosis not present

## 2018-10-12 DIAGNOSIS — M545 Low back pain: Secondary | ICD-10-CM | POA: Insufficient documentation

## 2018-10-12 DIAGNOSIS — M546 Pain in thoracic spine: Secondary | ICD-10-CM | POA: Diagnosis not present

## 2018-10-12 DIAGNOSIS — Z794 Long term (current) use of insulin: Secondary | ICD-10-CM | POA: Diagnosis not present

## 2018-10-12 DIAGNOSIS — Y998 Other external cause status: Secondary | ICD-10-CM | POA: Insufficient documentation

## 2018-10-12 DIAGNOSIS — I509 Heart failure, unspecified: Secondary | ICD-10-CM | POA: Insufficient documentation

## 2018-10-12 DIAGNOSIS — E1122 Type 2 diabetes mellitus with diabetic chronic kidney disease: Secondary | ICD-10-CM | POA: Diagnosis not present

## 2018-10-12 DIAGNOSIS — Z8673 Personal history of transient ischemic attack (TIA), and cerebral infarction without residual deficits: Secondary | ICD-10-CM | POA: Insufficient documentation

## 2018-10-12 DIAGNOSIS — Z7901 Long term (current) use of anticoagulants: Secondary | ICD-10-CM | POA: Diagnosis not present

## 2018-10-12 DIAGNOSIS — I130 Hypertensive heart and chronic kidney disease with heart failure and stage 1 through stage 4 chronic kidney disease, or unspecified chronic kidney disease: Secondary | ICD-10-CM | POA: Insufficient documentation

## 2018-10-12 DIAGNOSIS — M25552 Pain in left hip: Secondary | ICD-10-CM | POA: Insufficient documentation

## 2018-10-12 DIAGNOSIS — J449 Chronic obstructive pulmonary disease, unspecified: Secondary | ICD-10-CM | POA: Insufficient documentation

## 2018-10-12 DIAGNOSIS — N189 Chronic kidney disease, unspecified: Secondary | ICD-10-CM | POA: Diagnosis not present

## 2018-10-12 DIAGNOSIS — Y92410 Unspecified street and highway as the place of occurrence of the external cause: Secondary | ICD-10-CM | POA: Insufficient documentation

## 2018-10-12 DIAGNOSIS — M79632 Pain in left forearm: Secondary | ICD-10-CM | POA: Diagnosis not present

## 2018-10-12 DIAGNOSIS — E039 Hypothyroidism, unspecified: Secondary | ICD-10-CM | POA: Insufficient documentation

## 2018-10-12 DIAGNOSIS — I2510 Atherosclerotic heart disease of native coronary artery without angina pectoris: Secondary | ICD-10-CM | POA: Insufficient documentation

## 2018-10-12 DIAGNOSIS — Z85038 Personal history of other malignant neoplasm of large intestine: Secondary | ICD-10-CM | POA: Diagnosis not present

## 2018-10-12 DIAGNOSIS — Y9389 Activity, other specified: Secondary | ICD-10-CM | POA: Diagnosis not present

## 2018-10-12 DIAGNOSIS — Z7982 Long term (current) use of aspirin: Secondary | ICD-10-CM | POA: Insufficient documentation

## 2018-10-12 DIAGNOSIS — M542 Cervicalgia: Secondary | ICD-10-CM | POA: Diagnosis not present

## 2018-10-12 DIAGNOSIS — S82402A Unspecified fracture of shaft of left fibula, initial encounter for closed fracture: Secondary | ICD-10-CM | POA: Diagnosis not present

## 2018-10-12 DIAGNOSIS — Z87891 Personal history of nicotine dependence: Secondary | ICD-10-CM | POA: Insufficient documentation

## 2018-10-12 DIAGNOSIS — Z950 Presence of cardiac pacemaker: Secondary | ICD-10-CM | POA: Insufficient documentation

## 2018-10-12 DIAGNOSIS — Z9861 Coronary angioplasty status: Secondary | ICD-10-CM | POA: Insufficient documentation

## 2018-10-12 DIAGNOSIS — S8992XA Unspecified injury of left lower leg, initial encounter: Secondary | ICD-10-CM | POA: Diagnosis present

## 2018-10-12 MED ORDER — CYCLOBENZAPRINE HCL 10 MG PO TABS
5.0000 mg | ORAL_TABLET | Freq: Once | ORAL | Status: AC
  Administered 2018-10-12: 5 mg via ORAL
  Filled 2018-10-12: qty 1

## 2018-10-12 MED ORDER — TRAMADOL HCL 50 MG PO TABS: 50.0000 mg | ORAL_TABLET | Freq: Four times a day (QID) | ORAL | 0 refills | Status: DC | PRN

## 2018-10-12 NOTE — Discharge Instructions (Addendum)
Follow-up with [facility] orthopedics.  Please call for an appointment.  If he needs the tramadol for pain do not give him the trazodone in conjunction with this medication.  Apply ice to the left lower leg.
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
## 2018-10-12 NOTE — ED Notes (Signed)
Pt transferred to stretcher, pt is taken to radiology
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
## 2018-10-13 ENCOUNTER — Ambulatory Visit: Payer: Medicare Other

## 2018-10-15 ENCOUNTER — Emergency Department
Admission: EM | Admit: 2018-10-15 | Discharge: 2018-10-15 | Disposition: A | Discharge status: Home / Self Care | Payer: Medicare Other | Attending: Emergency Medicine | Admitting: Emergency Medicine

## 2018-10-15 ENCOUNTER — Emergency Department: Payer: Medicare Other

## 2018-10-15 ENCOUNTER — Other Ambulatory Visit: Payer: Self-pay

## 2018-10-15 DIAGNOSIS — M79662 Pain in left lower leg: Secondary | ICD-10-CM | POA: Insufficient documentation

## 2018-10-15 DIAGNOSIS — I509 Heart failure, unspecified: Secondary | ICD-10-CM | POA: Insufficient documentation

## 2018-10-15 DIAGNOSIS — Z85038 Personal history of other malignant neoplasm of large intestine: Secondary | ICD-10-CM | POA: Insufficient documentation

## 2018-10-15 DIAGNOSIS — Z79899 Other long term (current) drug therapy: Secondary | ICD-10-CM | POA: Diagnosis not present

## 2018-10-15 DIAGNOSIS — R05 Cough: Secondary | ICD-10-CM | POA: Diagnosis not present

## 2018-10-15 DIAGNOSIS — J441 Chronic obstructive pulmonary disease with (acute) exacerbation: Secondary | ICD-10-CM | POA: Insufficient documentation

## 2018-10-15 DIAGNOSIS — Z87891 Personal history of nicotine dependence: Secondary | ICD-10-CM | POA: Diagnosis not present

## 2018-10-15 DIAGNOSIS — E039 Hypothyroidism, unspecified: Secondary | ICD-10-CM | POA: Diagnosis not present

## 2018-10-15 DIAGNOSIS — N189 Chronic kidney disease, unspecified: Secondary | ICD-10-CM | POA: Diagnosis not present

## 2018-10-15 DIAGNOSIS — Z7982 Long term (current) use of aspirin: Secondary | ICD-10-CM | POA: Diagnosis not present

## 2018-10-15 DIAGNOSIS — Z7901 Long term (current) use of anticoagulants: Secondary | ICD-10-CM | POA: Insufficient documentation

## 2018-10-15 DIAGNOSIS — Z794 Long term (current) use of insulin: Secondary | ICD-10-CM | POA: Diagnosis not present

## 2018-10-15 DIAGNOSIS — R0602 Shortness of breath: Secondary | ICD-10-CM | POA: Diagnosis present

## 2018-10-15 DIAGNOSIS — I130 Hypertensive heart and chronic kidney disease with heart failure and stage 1 through stage 4 chronic kidney disease, or unspecified chronic kidney disease: Secondary | ICD-10-CM | POA: Diagnosis not present

## 2018-10-15 DIAGNOSIS — E1122 Type 2 diabetes mellitus with diabetic chronic kidney disease: Secondary | ICD-10-CM | POA: Insufficient documentation

## 2018-10-15 DIAGNOSIS — I252 Old myocardial infarction: Secondary | ICD-10-CM | POA: Insufficient documentation

## 2018-10-15 DIAGNOSIS — Z950 Presence of cardiac pacemaker: Secondary | ICD-10-CM | POA: Insufficient documentation

## 2018-10-15 DIAGNOSIS — E1140 Type 2 diabetes mellitus with diabetic neuropathy, unspecified: Secondary | ICD-10-CM | POA: Insufficient documentation

## 2018-10-15 LAB — CBC WITH DIFFERENTIAL/PLATELET
Abs Immature Granulocytes: 0.04 10*3/uL (ref 0.00–0.07)
Basophils Absolute: 0.1 10*3/uL (ref 0.0–0.1)
Basophils Relative: 1 %
Eosinophils Absolute: 0.3 10*3/uL (ref 0.0–0.5)
Eosinophils Relative: 3 %
HEMATOCRIT: 41.8 % (ref 39.0–52.0)
HEMOGLOBIN: 13.4 g/dL (ref 13.0–17.0)
Immature Granulocytes: 1 %
Lymphocytes Relative: 13 %
Lymphs Abs: 1.1 10*3/uL (ref 0.7–4.0)
MCH: 29.1 pg (ref 26.0–34.0)
MCHC: 32.1 g/dL (ref 30.0–36.0)
MCV: 90.9 fL (ref 80.0–100.0)
MONO ABS: 0.6 10*3/uL (ref 0.1–1.0)
Monocytes Relative: 8 %
Neutro Abs: 6 10*3/uL (ref 1.7–7.7)
Neutrophils Relative %: 74 %
Platelets: 214 10*3/uL (ref 150–400)
RBC: 4.6 MIL/uL (ref 4.22–5.81)
RDW: 14.2 % (ref 11.5–15.5)
WBC: 8 10*3/uL (ref 4.0–10.5)
nRBC: 0 % (ref 0.0–0.2)

## 2018-10-15 LAB — BASIC METABOLIC PANEL
Anion gap: 6 (ref 5–15)
BUN: 14 mg/dL (ref 8–23)
CHLORIDE: 104 mmol/L (ref 98–111)
CO2: 27 mmol/L (ref 22–32)
Calcium: 8.9 mg/dL (ref 8.9–10.3)
Creatinine, Ser: 0.86 mg/dL (ref 0.61–1.24)
GFR calc Af Amer: >60 mL/min (ref >60)
GFR calc non Af Amer: >60 mL/min (ref >60)
GLUCOSE: 239 mg/dL — AB (ref 70–99)
Potassium: 4.1 mmol/L (ref 3.5–5.1)
Sodium: 137 mmol/L (ref 135–145)

## 2018-10-15 LAB — PROTIME-INR
INR: 2.08
Prothrombin Time: 23.1 seconds — ABNORMAL HIGH (ref 11.4–15.2)

## 2018-10-15 LAB — TROPONIN I
Troponin I: <0.03 ng/mL (ref <0.03)
Troponin I: <0.03 ng/mL (ref <0.03)

## 2018-10-15 LAB — INFLUENZA PANEL BY PCR (TYPE A & B)
Influenza A By PCR: NEGATIVE
Influenza B By PCR: NEGATIVE

## 2018-10-15 LAB — BRAIN NATRIURETIC PEPTIDE: B Natriuretic Peptide: 73 pg/mL (ref 0.0–100.0)

## 2018-10-15 MED ORDER — ALBUTEROL SULFATE HFA 108 (90 BASE) MCG/ACT IN AERS: 2.0000 | INHALATION_SPRAY | Freq: Four times a day (QID) | RESPIRATORY_TRACT | 0 refills | Status: AC | PRN

## 2018-10-15 MED ORDER — DOXYCYCLINE HYCLATE 100 MG PO TABS
100.0000 mg | ORAL_TABLET | Freq: Once | ORAL | Status: AC
  Administered 2018-10-15: 100 mg via ORAL
  Filled 2018-10-15: qty 1

## 2018-10-15 MED ORDER — IPRATROPIUM-ALBUTEROL 0.5-2.5 (3) MG/3ML IN SOLN
9.0000 mL | Freq: Once | RESPIRATORY_TRACT | Status: AC
  Administered 2018-10-15: 9 mL via RESPIRATORY_TRACT
  Filled 2018-10-15: qty 3

## 2018-10-15 MED ORDER — OXYCODONE-ACETAMINOPHEN 5-325 MG PO TABS
1.0000 | ORAL_TABLET | Freq: Once | ORAL | Status: AC
  Administered 2018-10-15: 1 via ORAL
  Filled 2018-10-15: qty 1

## 2018-10-15 MED ORDER — IOHEXOL 350 MG/ML SOLN
75.0000 mL | Freq: Once | INTRAVENOUS | Status: AC | PRN
  Administered 2018-10-15: 75 mL via INTRAVENOUS

## 2018-10-15 MED ORDER — DOXYCYCLINE HYCLATE 100 MG PO CAPS: 100.0000 mg | ORAL_CAPSULE | Freq: Two times a day (BID) | ORAL | 0 refills | Status: DC

## 2018-10-15 MED ORDER — METHYLPREDNISOLONE SODIUM SUCC 125 MG IJ SOLR
125.0000 mg | Freq: Once | INTRAMUSCULAR | Status: AC
  Administered 2018-10-15: 125 mg via INTRAVENOUS
  Filled 2018-10-15: qty 2

## 2018-10-15 MED ORDER — PREDNISONE 20 MG PO TABS: 40.0000 mg | ORAL_TABLET | Freq: Every day | ORAL | 0 refills | Status: AC
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
## 2018-10-15 NOTE — ED Triage Notes (Signed)
Pt arrives via [facility] from Home for difficulty breathening. 96% COPD CHF Pacemaker 60  250 CBG [place] meds taken  L/S paralysis r/t CVA  Not on O2 at home

## 2018-10-15 NOTE — ED Notes (Signed)
Sent green top and urine specimen to lab.

## 2018-10-15 NOTE — ED Notes (Signed)
Gwen in lab notified of PT-INR order at this time. Lab will run test now.

## 2018-10-20 ENCOUNTER — Ambulatory Visit
Admission: RE | Admit: 2018-10-20 | Discharge: 2018-10-20 | Disposition: A | Discharge status: Home / Self Care | Payer: Medicare Other | Source: Ambulatory Visit | Attending: Unknown Physician Specialty | Admitting: Unknown Physician Specialty

## 2018-10-20 DIAGNOSIS — K118 Other diseases of salivary glands: Secondary | ICD-10-CM | POA: Insufficient documentation

## 2018-10-24 ENCOUNTER — Other Ambulatory Visit: Payer: Self-pay | Admitting: Unknown Physician Specialty

## 2018-10-24 ENCOUNTER — Other Ambulatory Visit (HOSPITAL_COMMUNITY): Payer: Self-pay | Admitting: Unknown Physician Specialty

## 2018-10-24 DIAGNOSIS — K118 Other diseases of salivary glands: Secondary | ICD-10-CM

## 2018-10-24 DIAGNOSIS — E041 Nontoxic single thyroid nodule: Secondary | ICD-10-CM

## 2018-11-11 DIAGNOSIS — I69359 Hemiplegia and hemiparesis following cerebral infarction affecting unspecified side: Secondary | ICD-10-CM | POA: Insufficient documentation

## 2018-12-05 ENCOUNTER — Telehealth: Payer: Self-pay | Admitting: Urology

## 2018-12-05 NOTE — Telephone Encounter (Signed)
Pt's wife would like a call back in reference to "speedi cath" Please advise.

## 2018-12-06 NOTE — Telephone Encounter (Signed)
Called pt's wife no answer. LM for wife to return call.

## 2018-12-15 ENCOUNTER — Telehealth: Payer: Self-pay | Admitting: Urology

## 2018-12-15 NOTE — Telephone Encounter (Signed)
Pt is having difficulty urinating, also his urine has a bad odor.

## 2018-12-15 NOTE — Telephone Encounter (Signed)
Spoke to patient's wife and she is cathing him 4 times daily. He is not drinking much water. I informed her to make sure he is getting in plenty of water and to have him try to urinate every 2 hours and to continue to cath 4 times daily. Patient states he is running out of catheter's I informed him that to come get some. The catheters have been placed up front for pick up.

## 2018-12-20 ENCOUNTER — Ambulatory Visit: Payer: Medicare Other | Admitting: Urology

## 2019-01-03 ENCOUNTER — Telehealth: Payer: Self-pay | Admitting: Urology

## 2019-01-03 NOTE — Telephone Encounter (Signed)
Spoke with the patient's wife just now to change his app and she stated that they are having trouble finding/getting catheters for him. She said that she is having to cath him 3 times a day. I told her I was not sure if we had any but I would check and see if we were able to help. She thinks he uses [facility] or [facility] wasn't sure but if someone could reach out to them please.   Thanks, Sharyn Lull

## 2019-01-03 NOTE — Telephone Encounter (Signed)
Spoke with patient's wife and an order for catheters was placed with [facility]

## 2019-01-04 ENCOUNTER — Telehealth: Payer: Self-pay | Admitting: Urology

## 2019-01-04 NOTE — Telephone Encounter (Signed)
We need to fax Mr. Verbrugge's office note to [facility] [employer] @ [phone], so he can get his catheters.   I have addended the note and it should be on the printer in my pod.  Thanks!

## 2019-01-04 NOTE — Telephone Encounter (Signed)
Faxed to [facility] [employer].

## 2019-01-17 ENCOUNTER — Other Ambulatory Visit: Payer: Self-pay

## 2019-01-17 ENCOUNTER — Telehealth (INDEPENDENT_AMBULATORY_CARE_PROVIDER_SITE_OTHER): Payer: Medicare Other | Admitting: Urology

## 2019-01-17 DIAGNOSIS — N200 Calculus of kidney: Secondary | ICD-10-CM | POA: Diagnosis not present

## 2019-01-17 DIAGNOSIS — R3914 Feeling of incomplete bladder emptying: Secondary | ICD-10-CM | POA: Diagnosis not present

## 2019-01-17 DIAGNOSIS — N132 Hydronephrosis with renal and ureteral calculous obstruction: Secondary | ICD-10-CM

## 2019-01-17 DIAGNOSIS — N261 Atrophy of kidney (terminal): Secondary | ICD-10-CM

## 2019-01-17 NOTE — Progress Notes (Signed)
Virtual Visit via Telephone Note  I connected with Michael Bush on 01/17/19 at  1:00 PM EDT by telephone and verified that I am speaking with the correct person using two identifiers.   I discussed the limitations, risks, security and privacy concerns of performing an evaluation and management service by telephone and the availability of in person appointments. We discussed the impact of the COVID-19 on the healthcare system, and the importance of social distancing and reducing patient and provider exposure. I also discussed with the patient that there may be a patient responsible charge related to this service. The patient expressed understanding and agreed to proceed.  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  I last saw him in November 2019 after developing urinary retention.  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  When I saw him in November his residual was 444 mL and was recommending placing a catheter for 7 to 10 days.  He had persistent retention and was maintained on intermittent catheterization.  His wife has been performing intermittent catheterization.  She states he was hospitalized at [facility] for a UTI on 01/09/2019 and stayed overnight.  She has recently had difficulty catheterizing him.  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  His wife has had increasing difficulty with intermittent catheterization.  He did not tolerate his previous urethral catheter well.  I discussed options including placement of a percutaneous suprapubic tube by interventional radiology.  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  He is not interested in surgery and would like to think over suprapubic tube placement.  Follow Up: Will contact next week regarding SP tube placement.   I discussed the assessment and treatment plan with the patient. The patient was provided an opportunity to ask questions and all were answered. The patient agreed with the plan and demonstrated an understanding of the instructions.   The patient was advised to call back or seek an in-person evaluation if the symptoms worsen or if the condition fails to improve as anticipated.  I provided 11 minutes of non-face-to-face time during this encounter.   Abbie Sons, MD

## 2019-02-01 ENCOUNTER — Telehealth: Payer: Self-pay | Admitting: Urology

## 2019-02-01 MED ORDER — SULFAMETHOXAZOLE-TRIMETHOPRIM 800-160 MG PO TABS: 1.0000 | ORAL_TABLET | Freq: Two times a day (BID) | ORAL | 0 refills | Status: DC

## 2019-02-01 NOTE — Telephone Encounter (Signed)
Pt's wife called to say pt. Is having UTI symptoms, blood in urine and can not empty bladder. She wants someone to call her back about these issues as well as moving forward with SPT placement. She expressed a need for a medication refill that was prescribed through [facility] and would like to speak to someone regarding this as well.

## 2019-02-01 NOTE — Telephone Encounter (Signed)
Would go ahead and send in Rx Septra DS 1 twice daily for 7 days.  If symptoms do not improve by Monday would recommend a urine culture.  I copied Amy on this message to schedule SP tube placement  Unless she has the name of the medication from [facility] it will be difficult to assist

## 2019-02-01 NOTE — Telephone Encounter (Signed)
Patient's wife notified and script sent 

## 2019-02-01 NOTE — Telephone Encounter (Signed)
Please advise 

## 2019-02-06 ENCOUNTER — Other Ambulatory Visit: Payer: Self-pay | Admitting: Radiology

## 2019-02-06 ENCOUNTER — Other Ambulatory Visit: Payer: Medicare Other

## 2019-02-06 DIAGNOSIS — R339 Retention of urine, unspecified: Secondary | ICD-10-CM

## 2019-02-08 ENCOUNTER — Other Ambulatory Visit: Payer: Medicare Other

## 2019-02-08 ENCOUNTER — Other Ambulatory Visit: Payer: Self-pay

## 2019-02-08 DIAGNOSIS — R310 Gross hematuria: Secondary | ICD-10-CM

## 2019-02-08 DIAGNOSIS — R339 Retention of urine, unspecified: Secondary | ICD-10-CM

## 2019-02-08 LAB — URINALYSIS, COMPLETE
Bilirubin, UA: NEGATIVE
Glucose, UA: NEGATIVE
Ketones, UA: NEGATIVE
Nitrite, UA: NEGATIVE
Specific Gravity, UA: >1.03 — ABNORMAL HIGH (ref 1.005–1.030)
Urobilinogen, Ur: 0.2 mg/dL (ref 0.2–1.0)
pH, UA: 5 (ref 5.0–7.5)

## 2019-02-08 LAB — MICROSCOPIC EXAMINATION

## 2019-02-09 ENCOUNTER — Other Ambulatory Visit: Payer: Self-pay | Admitting: Radiology

## 2019-02-10 LAB — URINE CULTURE: Organism ID, Bacteria: NO GROWTH

## 2019-02-12 ENCOUNTER — Other Ambulatory Visit: Payer: Self-pay

## 2019-02-12 ENCOUNTER — Ambulatory Visit
Admission: RE | Admit: 2019-02-12 | Discharge: 2019-02-12 | Disposition: A | Discharge status: Home / Self Care | Payer: Medicare Other | Source: Ambulatory Visit | Attending: Urology | Admitting: Urology

## 2019-02-12 DIAGNOSIS — R339 Retention of urine, unspecified: Secondary | ICD-10-CM

## 2019-02-12 DIAGNOSIS — I4891 Unspecified atrial fibrillation: Secondary | ICD-10-CM | POA: Insufficient documentation

## 2019-02-12 DIAGNOSIS — Z79899 Other long term (current) drug therapy: Secondary | ICD-10-CM | POA: Insufficient documentation

## 2019-02-12 DIAGNOSIS — K219 Gastro-esophageal reflux disease without esophagitis: Secondary | ICD-10-CM | POA: Diagnosis not present

## 2019-02-12 DIAGNOSIS — Z950 Presence of cardiac pacemaker: Secondary | ICD-10-CM | POA: Insufficient documentation

## 2019-02-12 DIAGNOSIS — Z85038 Personal history of other malignant neoplasm of large intestine: Secondary | ICD-10-CM | POA: Insufficient documentation

## 2019-02-12 DIAGNOSIS — I252 Old myocardial infarction: Secondary | ICD-10-CM | POA: Insufficient documentation

## 2019-02-12 DIAGNOSIS — I130 Hypertensive heart and chronic kidney disease with heart failure and stage 1 through stage 4 chronic kidney disease, or unspecified chronic kidney disease: Secondary | ICD-10-CM | POA: Diagnosis not present

## 2019-02-12 DIAGNOSIS — Z881 Allergy status to other antibiotic agents status: Secondary | ICD-10-CM | POA: Diagnosis not present

## 2019-02-12 DIAGNOSIS — J449 Chronic obstructive pulmonary disease, unspecified: Secondary | ICD-10-CM | POA: Diagnosis not present

## 2019-02-12 DIAGNOSIS — Z7901 Long term (current) use of anticoagulants: Secondary | ICD-10-CM | POA: Insufficient documentation

## 2019-02-12 DIAGNOSIS — E1122 Type 2 diabetes mellitus with diabetic chronic kidney disease: Secondary | ICD-10-CM | POA: Diagnosis not present

## 2019-02-12 DIAGNOSIS — Z809 Family history of malignant neoplasm, unspecified: Secondary | ICD-10-CM | POA: Insufficient documentation

## 2019-02-12 DIAGNOSIS — M1990 Unspecified osteoarthritis, unspecified site: Secondary | ICD-10-CM | POA: Diagnosis not present

## 2019-02-12 DIAGNOSIS — I509 Heart failure, unspecified: Secondary | ICD-10-CM | POA: Insufficient documentation

## 2019-02-12 DIAGNOSIS — Z884 Allergy status to anesthetic agent status: Secondary | ICD-10-CM | POA: Insufficient documentation

## 2019-02-12 DIAGNOSIS — I2510 Atherosclerotic heart disease of native coronary artery without angina pectoris: Secondary | ICD-10-CM | POA: Insufficient documentation

## 2019-02-12 DIAGNOSIS — Z7982 Long term (current) use of aspirin: Secondary | ICD-10-CM | POA: Diagnosis not present

## 2019-02-12 DIAGNOSIS — N189 Chronic kidney disease, unspecified: Secondary | ICD-10-CM | POA: Diagnosis not present

## 2019-02-12 DIAGNOSIS — Z91048 Other nonmedicinal substance allergy status: Secondary | ICD-10-CM | POA: Diagnosis not present

## 2019-02-12 DIAGNOSIS — Z888 Allergy status to other drugs, medicaments and biological substances status: Secondary | ICD-10-CM | POA: Insufficient documentation

## 2019-02-12 DIAGNOSIS — Z87891 Personal history of nicotine dependence: Secondary | ICD-10-CM | POA: Diagnosis not present

## 2019-02-12 DIAGNOSIS — Z794 Long term (current) use of insulin: Secondary | ICD-10-CM | POA: Insufficient documentation

## 2019-02-12 LAB — CBC
HCT: 50.2 % (ref 39.0–52.0)
Hemoglobin: 15.6 g/dL (ref 13.0–17.0)
MCH: 28.1 pg (ref 26.0–34.0)
MCHC: 31.1 g/dL (ref 30.0–36.0)
MCV: 90.3 fL (ref 80.0–100.0)
Platelets: 228 10*3/uL (ref 150–400)
RBC: 5.56 MIL/uL (ref 4.22–5.81)
RDW: 14.3 % (ref 11.5–15.5)
WBC: 6 10*3/uL (ref 4.0–10.5)
nRBC: 0 % (ref 0.0–0.2)

## 2019-02-12 LAB — PROTIME-INR
INR: 1.8 — ABNORMAL HIGH (ref 0.8–1.2)
Prothrombin Time: 20.8 seconds — ABNORMAL HIGH (ref 11.4–15.2)

## 2019-02-12 LAB — GLUCOSE, CAPILLARY: Glucose-Capillary: 180 mg/dL — ABNORMAL HIGH (ref 70–99)

## 2019-02-12 LAB — APTT: aPTT: 44 seconds — ABNORMAL HIGH (ref 24–36)

## 2019-02-12 MED ORDER — MIDAZOLAM HCL 5 MG/5ML IJ SOLN
INTRAMUSCULAR | Status: AC
  Filled 2019-02-12: qty 5

## 2019-02-12 MED ORDER — FENTANYL CITRATE (PF) 100 MCG/2ML IJ SOLN
INTRAMUSCULAR | Status: AC | PRN
  Administered 2019-02-12 (×2): 50 ug via INTRAVENOUS

## 2019-02-12 MED ORDER — LIDOCAINE HCL (PF) 1 % IJ SOLN
INTRAMUSCULAR | Status: AC | PRN
  Administered 2019-02-12: 10 mL

## 2019-02-12 MED ORDER — CEFAZOLIN SODIUM-DEXTROSE 2-4 GM/100ML-% IV SOLN
INTRAVENOUS | Status: AC
  Administered 2019-02-12: 2 g
  Filled 2019-02-12: qty 100

## 2019-02-12 MED ORDER — FENTANYL CITRATE (PF) 100 MCG/2ML IJ SOLN
INTRAMUSCULAR | Status: AC
  Filled 2019-02-12: qty 2

## 2019-02-12 MED ORDER — MIDAZOLAM HCL 2 MG/2ML IJ SOLN
INTRAMUSCULAR | Status: AC | PRN
  Administered 2019-02-12: 2 mg via INTRAVENOUS
  Administered 2019-02-12: 1 mg via INTRAVENOUS

## 2019-02-12 MED ORDER — SODIUM CHLORIDE 0.9 % IV SOLN
INTRAVENOUS | Status: DC
  Administered 2019-02-12: 11:00:00 via INTRAVENOUS

## 2019-02-12 MED ORDER — CEFAZOLIN SODIUM-DEXTROSE 2-4 GM/100ML-% IV SOLN
2.0000 g | Freq: Once | INTRAVENOUS | Status: DC
  Filled 2019-02-12: qty 100

## 2019-02-12 NOTE — Discharge Instructions (Signed)
Moderate Conscious Sedation, Adult, Care After These instructions provide you with information about caring for yourself after your procedure. Your health care provider may also give you more specific instructions. Your treatment has been planned according to current medical practices, but problems sometimes occur. Call your health care provider if you have any problems or questions after your procedure. What can I expect after the procedure? After your procedure, it is common:  To feel sleepy for several hours.  To feel clumsy and have poor balance for several hours.  To have poor judgment for several hours.  To vomit if you eat too soon. Follow these instructions at home: For at least 24 hours after the procedure:   Do not: ? Participate in activities where you could fall or become injured. ? Drive. ? Use heavy machinery. ? Drink alcohol. ? Take sleeping pills or medicines that cause drowsiness. ? Make important decisions or sign legal documents. ? Take care of children on your own.  Rest. Eating and drinking  Follow the diet recommended by your health care provider.  If you vomit: ? Drink water, juice, or soup when you can drink without vomiting. ? Make sure you have little or no nausea before eating solid foods. General instructions  Have a responsible adult stay with you until you are awake and alert.  Take over-the-counter and prescription medicines only as told by your health care provider.  If you smoke, do not smoke without supervision.  Keep all follow-up visits as told by your health care provider. This is important. Contact a health care provider if:  You keep feeling nauseous or you keep vomiting.  You feel light-headed.  You develop a rash.  You have a fever. Get help right away if:  You have trouble breathing. This information is not intended to replace advice given to you by your health care provider. Make sure you discuss any questions you have  with your health care provider. Document Released: 07/04/2013 Document Revised: 02/16/2016 Document Reviewed: 01/03/2016 Elsevier Interactive Patient Education  2019 [place] A suprapubic catheter is a rubber tube used to drain urine from the bladder into a collection bag. The catheter is inserted into the bladder through a small opening in the in the lower abdomen, near the center of the body, above the pubic bone (suprapubic area). There is a tiny balloon filled with germ-free (sterile) water on the end of the catheter that is in the bladder. The balloon helps to keep the catheter in place. Your suprapubic catheter may need to be replaced every 4-6 weeks, or as often as recommended by your health care provider. The collection bag must be emptied every day and cleaned every 2-3 days. The collection bag can be put beside your bed at night and attached to your leg during the day. You may have a large collection bag to use at night and a smaller one to use during the day. What are the risks?  Urine flow can become blocked. This can happen if the catheter is not working correctly, or if you have a blood clot in your bladder or in the catheter.  Tissue near the catheter may can become irritated and bleed.  Bacteria may get into your bladder and cause a urinary tract infection. How do I change the catheter? Supplies needed  Two pairs of sterile gloves.  Catheter.  Two syringes.  Sterile water.  Sterile cleaning solution.  Lubricant.  Collection bags. Changing the catheter To replace  your catheter, take the following steps: 1. Drink plenty of fluids during the hours before you plan to change the catheter. 2. Wash your hands with soap and water. If soap and water are not available, use hand sanitizer. 3. Lie on your back and put on sterile gloves. 4. Clean the skin around the catheter opening using the sterile cleaning solution. 5. Remove the water  from the balloon using a syringe. 6. Slowly remove the catheter. ? Do not pull on the catheter if it seems stuck. ? Call your health care provider immediately if you have difficulty removing the catheter. 7. Take off the used gloves, and put on a new pair. 8. Put lubricant on the end of the new catheter that will go into your bladder. 9. Gently slide the catheter through the opening in your abdomen and into your bladder. 10. Wait for some urine to start flowing through the catheter. When urine starts to flow through the catheter, use a new syringe to fill the balloon with sterile water. 11. Attach the collection bag to the end of the catheter. Make sure the connection is tight. 12. Remove the gloves and wash your hands with soap and water. How do I care for my skin around the catheter? Use a clean washcloth and soapy water to clean the skin around your catheter every day. Pat the area dry with a clean towel.  Do not pull on the catheter.  Do not use ointment or lotion on this area unless told by your health care provider.  Check your skin around the catheter every day for signs of infection. Check for: ? Redness, swelling, or pain. ? Fluid or blood. ? Warmth. ? Pus or a bad smell. How do I clean and empty the collection bag? Clean the collection bag every 2-3 days, or as often as told by your health care provider. To do this, take the following steps:  Wash your hands with soap and water. If soap and water are not available, use hand sanitizer.  Disconnect the bag from the catheter and immediately attach a new bag to the catheter.  Empty the used bag completely.  Clean the used bag using one of the following methods: ? Rinse the used bag with warm water and soap. ? Fill the bag with water and add 1 tsp of vinegar. Let it sit for about 30 minutes, then empty the bag.  Let the bag dry completely, and put it in a clean plastic bag before storing it. Empty the large collection bag  every 8 hours. Empty the small collection bag when it is about ? full. To empty your large or small collection bag, take the following steps:  Always keep the bag below the level of the catheter. This keeps urine from flowing backwards into the catheter.  Hold the bag over the toilet or another container. Turn the valve (spigot) at the bottom of the bag to empty the urine. ? Do not touch the opening of the spigot. ? Do not let the opening touch the toilet or container.  Close the spigot tightly when the bag is empty. What are some general tips?   Always wash your hands before and after caring for your catheter and collection bag. Use a mild, fragrance-free soap. If soap and water are not available, use hand sanitizer.  Clean the catheter with soap and water as often as told by your health care provider.  Always make sure there are no twists or curls (kinks) in  the catheter tube.  Always make sure there are no leaks in the catheter or collection bag.  Drink enough fluid to keep your urine clear or pale yellow.  Do not take baths, swim, or use a hot tub. When should I seek medical care? Seek medical care if:  You leak urine.  You have redness, swelling, or pain around your catheter opening.  You have fluid or blood coming from your catheter opening.  Your catheter opening feels warm to the touch.  You have pus or a bad smell coming from your catheter opening.  You have a fever or chills.  Your urine flow slows down.  Your urine becomes cloudy or smelly. When should I seek immediate medical care? Seek immediate medical care if your catheter comes out, or if you have:  Nausea.  Back pain.  Difficulty changing your catheter.  Blood in your urine.  No urine flow for 1 hour. This information is not intended to replace advice given to you by your health care provider. Make sure you discuss any questions you have with your health care provider. Document Released:  06/01/2011 Document Revised: 05/12/2016 Document Reviewed: 05/27/2015 Elsevier Interactive Patient Education  2019 [employer].

## 2019-02-12 NOTE — H&P (Signed)
Chief Complaint: Urinary retention  Referring Physician(s): Stoioff,Scott C  Supervising Physician: Corrie Mckusick  Patient Status: [facility] - Out-pt  History of Present Illness: Michael Bush is a 78 y.o. male presenting for evaluation and management of chronic urinary retention.    He has been referred for possible supra-pubic catheter placement with imaging.   He has been managed by his Urologist over time with intermittent catheterization, and has a history of UTI's, including need for hospitalization.   He has been bridged from coumadin with lovenox subcu.    Past Medical History:  Diagnosis Date  . Arthritis   . CHF (congestive heart failure) ([place])   . Chronic kidney disease    chronic kidney disease  . Colon cancer ([place]) 2003   Hx Partial colon resection, chemo + rad tx's.  Marland Kitchen COPD (chronic obstructive pulmonary disease) ([place])   . Coronary artery disease   . Diabetes mellitus without complication ([place])   . Dysrhythmia    atrial fib., bradycardia  . GERD (gastroesophageal reflux disease)   . Hypertension   . Hypothyroidism   . Long term (current) use of anticoagulants   . Mixed hearing loss, unilateral    03/07/14  . Myocardial infarction ([place])   . Presence of permanent cardiac pacemaker    [employer] DDDR [phone]  . Pseudophakia of both eyes   . Sick sinus syndrome ([place]) 04/26/2014  . Stroke [facility])     Past Surgical History:  Procedure Laterality Date  . Cardioversion External    . Cataract cortical ssenile    . COLON SURGERY    . COLON SURGERY    . COLONOSCOPY WITH PROPOFOL N/A 01/27/2018   Procedure: COLONOSCOPY WITH PROPOFOL;  Surgeon: Lollie Sails, MD;  Location[facility] ENDOSCOPY;  Service: Endoscopy;  Laterality: N/A;  . CORONARY ANGIOPLASTY    . Insertion dual chamber pacemaker generator      Allergies: Vancomycin; Dabigatran; Pollen extract; and Pradaxa [dabigatran etexilate mesylate]  Medications: Prior to Admission  medications   Medication Sig Start Date End Date Taking? Authorizing Provider  acetaminophen (TYLENOL) 325 MG tablet Take 650 mg by mouth every 6 (six) hours as needed for mild pain or moderate pain.  10/11/14  Yes [provider]  albuterol (PROVENTIL HFA;VENTOLIN HFA) 108 (90 Base) MCG/ACT inhaler Inhale 2 puffs into the lungs every 6 (six) hours as needed. 10/15/18  Yes Schaevitz, Randall An, MD  aspirin 81 MG chewable tablet Chew 81 mg by mouth every morning. 07/02/15  Yes [provider]  atorvastatin (LIPITOR) 40 MG tablet TK 1 T PO D 05/02/18  Yes [provider]  B Complex Vitamins (VITAMIN B COMPLEX PO) Take 2 tablets by mouth daily.   Yes [provider]  Calcium Carb-Ergocalciferol 500-200 MG-UNIT TABS Take by mouth.   Yes [provider]  doxycycline (VIBRAMYCIN) 100 MG capsule Take 1 capsule (100 mg total) by mouth 2 (two) times daily. 10/15/18  Yes Schaevitz, Randall An, MD  insulin aspart (NOVOLOG) 100 UNIT/ML FlexPen Take Insulin as directed by sliding scale 04/27/18  Yes [provider]  Insulin Pen Needle (FIFTY50 PEN NEEDLES) 31G X 8 MM MISC Use as directed 04/27/18 04/27/19 Yes [provider]  ketotifen (ZADITOR) 0.025 % ophthalmic solution INT 1 GTT IN OU BID FOR ALLERGIES OR ITCHING 11/29/17  Yes [provider]  lactulose (CEPHULAC) 20 g packet Take 1 packet (20 g total) by mouth daily as needed (constipation). 12/22/17  Yes Schaevitz, Randall An, MD  NEOMYCIN-POLYMYXIN-HYDROCORTISONE (CORTISPORIN) 1 % SOLN OTIC solution  04/27/18  Yes [provider]  nitroGLYCERIN (NITROSTAT) 0.4 MG SL tablet Place 1 tablet (0.4 mg total) under the tongue every 5 (five) minutes as needed for chest pain. 03/08/18  Yes Bettey Costa, MD  Omega-3 Fatty Acids (KP FISH OIL) 1200 MG CAPS Take by mouth.   Yes [provider]  sertraline (ZOLOFT) 25 MG tablet Take by mouth. 08/10/18  Yes [provider]   tamsulosin (FLOMAX) 0.4 MG CAPS capsule Take 1 capsule (0.4 mg total) by mouth daily. 06/19/18  Yes Stoioff, Ronda Fairly, MD  tiZANidine (ZANAFLEX) 2 MG tablet  11/25/16  Yes [provider]  traMADol (ULTRAM) 50 MG tablet Take 1 tablet (50 mg total) by mouth every 6 (six) hours as needed. 10/12/18  Yes Fisher, Linden Dolin, PA-C  traZODone (DESYREL) 50 MG tablet Take by mouth. 08/10/18  Yes [provider]  vitamin B-12 (CYANOCOBALAMIN) 1000 MCG tablet Take 1,000 mcg by mouth daily.    Yes [provider]  warfarin (COUMADIN) 3 MG tablet Take 3 mg by mouth daily at 6 PM. Take 3MG  by mouth daily except on Friday take 5MG  by mouth   Yes [provider]  citalopram (CELEXA) 20 MG tablet Take 20 mg by mouth daily. 03/14/17 03/14/18  [provider]  famotidine (PEPCID) 20 MG tablet Take 20 mg by mouth 2 (two) times daily.  10/14/17 10/14/18  [provider]  glipiZIDE (GLUCOTROL) 5 MG tablet Take 5 mg by mouth 2 (two) times daily.  12/08/17   [provider]  sulfamethoxazole-trimethoprim (BACTRIM DS) 800-160 MG tablet Take 1 tablet by mouth every 12 (twelve) hours. Patient not taking: Reported on 02/12/2019 02/01/19   Abbie Sons, MD     Family History  Problem Relation Age of Onset  . Cancer Father     Social History   Socioeconomic History  . Marital status: Married    Spouse name: Not on file  . Number of children: Not on file  . Years of education: Not on file  . Highest education level: Not on file  Occupational History  . Not on file  Social Needs  . Financial resource strain: Not on file  . Food insecurity:    Worry: Not on file    Inability: Not on file  . Transportation needs:    Medical: Not on file    Non-medical: Not on file  Tobacco Use  . Smoking status: Former Smoker    Packs/day: 2.00    Years: 30.00    Pack years: 60.00    Types: Cigarettes  . Smokeless tobacco: Never Used  Substance and Sexual Activity  .  Alcohol use: No  . Drug use: No  . Sexual activity: Not Currently  Lifestyle  . Physical activity:    Days per week: Not on file    Minutes per session: Not on file  . Stress: Not on file  Relationships  . Social connections:    Talks on phone: Not on file    Gets together: Not on file    Attends religious service: Not on file    Active member of club or organization: Not on file    Attends meetings of clubs or organizations: Not on file    Relationship status: Not on file  Other Topics Concern  . Not on file  Social History Narrative  . Not on file      Review of Systems: A 12  point ROS discussed and pertinent positives are indicated in the HPI above.  All other systems are negative.  Review of Systems  Vital Signs: BP (!) 158/75   Pulse 62   Temp 97.6 F (36.4 C) (Oral)   Resp 20   Ht 5\' 10"  (1.778 m)   Wt 81.6 kg   SpO2 98%   BMI 25.83 kg/m   Physical Exam General: 78 yo male appearing   stated age.  Well-developed, well-nourished.  No distress. HEENT: Atraumatic, normocephalic.  Conjugate gaze, extra-ocular motor intact. No scleral icterus or scleral injection. No lesions on external ears, nose, lips, or gums.  Oral mucosa moist, pink.  Neck: Symmetric with no goiter enlargement.  Chest/Lungs:  Symmetric chest with inspiration/expiration.  No labored breathing.  Clear to auscultation with no wheezes, rhonchi, or rales.  Heart:  RRR, with no third heart sounds appreciated. No JVD appreciated.  Abdomen:  Soft, NT/ND, with + bowel sounds.   Genito-urinary: Deferred Neurologic: Alert & Oriented to person, place, and time.   Normal affect and insight.  Appropriate questions.  Moving all 4 extremities with gross sensory intact.     Imaging: No results found.  Labs:  CBC: Recent Labs    04/21/18 1415 10/11/18 1708 10/15/18 0750 02/12/19 1034  WBC 10.5 7.5 8.0 6.0  HGB 15.0 14.5 13.4 15.6  HCT 44.3 45.0 41.8 50.2  PLT 226 229 214 228    COAGS: Recent  Labs    04/21/18 1415 10/11/18 1708 10/15/18 0750 02/12/19 1034  INR 2.94 1.65 2.08 1.8*  APTT  --   --   --  44*    BMP: Recent Labs    03/07/18 1344 04/21/18 1415 10/11/18 1708 10/15/18 0750  NA 138 138 138 137  K 4.0 4.5 3.9 4.1  CL 101 100 106 104  CO2 26 26 24 27   GLUCOSE 194* 302* 159* 239*  BUN 14 17 14 14   CALCIUM 9.1 9.5 8.8* 8.9  CREATININE 0.86 1.09 0.92 0.86  GFRNONAA >60 >60 >60 >60  GFRAA >60 >60 >60 >60    LIVER FUNCTION TESTS: No results for input(s): BILITOT, AST, ALT, ALKPHOS, PROT, ALBUMIN in the last 8760 hours.  TUMOR MARKERS: No results for input(s): AFPTM, CEA, CA199, CHROMGRNA in the last 8760 hours.  Assessment and Plan:  Mr Hansson presents today for help in managing his urinary retention, and our plan is to proceed with image guided supra-pubic catheter insertion.   I have discussed this with Mr Vandyken and his wife, including the need for upsizing after the initial placement.  This may take 2-3 sessions.    Risks and benefits discussed with the patient including bleeding, infection, damage to adjacent structures, bowel perforation/fistula connection, and sepsis.  All of the patient's questions were answered, patient is agreeable to proceed. Consent signed and in chart.  Thank you for this interesting consult.  I greatly enjoyed meeting Mathhew Buysse and look forward to participating in their care.  A copy of this report was sent to the requesting provider on this date.  Electronically Signed: Corrie Mckusick, DO 02/12/2019, 11:19 AM   I spent a total of  40 Minutes   in face to face in clinical consultation, greater than 50% of which was counseling/coordinating care for imaging guided suprapubic catheter insertion.

## 2019-02-12 NOTE — Progress Notes (Signed)
Patient has remained clinically stable post SPT placement today per Dr Earleen Newport. Tolerated procedure well, received versed 2mg /fentanyl 100 mcg iv for procedure. Vitals remained stable throughout and post procedure. Denies complaints post procedure. Wife [name] has been at bedside with patient and very caring and helpful pre and post procedure, secondarily to pt with past multiple CVA's, and in wheelchair. Discharge instructions of suprapubic tube given to wife with multiple questions answered. Stated understanding, and return demostration given as well.

## 2019-02-12 NOTE — Procedures (Signed)
Interventional Radiology Procedure Note  Procedure: CT guided supra-pubic catheter placement.  39F pigtain drain to gravity .  Complications: None Recommendations:  - To gravity drain - 1 hour recovery - Would not schedule upsize for at least 4 weeks - Do not submerge - Routine care   Signed,  Dulcy Fanny. Earleen Newport, DO

## 2019-02-15 ENCOUNTER — Other Ambulatory Visit: Payer: Self-pay | Admitting: *Deleted

## 2019-02-15 MED ORDER — TAMSULOSIN HCL 0.4 MG PO CAPS: 0.4000 mg | ORAL_CAPSULE | Freq: Every day | ORAL | 11 refills | Status: AC

## 2019-02-20 ENCOUNTER — Other Ambulatory Visit: Payer: Self-pay | Admitting: Radiology

## 2019-02-20 DIAGNOSIS — R339 Retention of urine, unspecified: Secondary | ICD-10-CM

## 2019-03-15 ENCOUNTER — Emergency Department
Admission: EM | Admit: 2019-03-15 | Discharge: 2019-03-15 | Disposition: A | Discharge status: Home / Self Care | Payer: Medicare Other | Attending: Emergency Medicine | Admitting: Emergency Medicine

## 2019-03-15 ENCOUNTER — Encounter: Payer: Self-pay | Admitting: Emergency Medicine

## 2019-03-15 ENCOUNTER — Other Ambulatory Visit: Payer: Self-pay

## 2019-03-15 DIAGNOSIS — R339 Retention of urine, unspecified: Secondary | ICD-10-CM | POA: Diagnosis not present

## 2019-03-15 DIAGNOSIS — Z794 Long term (current) use of insulin: Secondary | ICD-10-CM | POA: Diagnosis not present

## 2019-03-15 DIAGNOSIS — E119 Type 2 diabetes mellitus without complications: Secondary | ICD-10-CM | POA: Diagnosis not present

## 2019-03-15 DIAGNOSIS — T83010A Breakdown (mechanical) of cystostomy catheter, initial encounter: Secondary | ICD-10-CM

## 2019-03-15 DIAGNOSIS — I130 Hypertensive heart and chronic kidney disease with heart failure and stage 1 through stage 4 chronic kidney disease, or unspecified chronic kidney disease: Secondary | ICD-10-CM | POA: Insufficient documentation

## 2019-03-15 DIAGNOSIS — Z79899 Other long term (current) drug therapy: Secondary | ICD-10-CM | POA: Diagnosis not present

## 2019-03-15 DIAGNOSIS — I509 Heart failure, unspecified: Secondary | ICD-10-CM | POA: Diagnosis not present

## 2019-03-15 DIAGNOSIS — N189 Chronic kidney disease, unspecified: Secondary | ICD-10-CM | POA: Insufficient documentation

## 2019-03-15 DIAGNOSIS — Z87891 Personal history of nicotine dependence: Secondary | ICD-10-CM | POA: Diagnosis not present

## 2019-03-15 DIAGNOSIS — Z7982 Long term (current) use of aspirin: Secondary | ICD-10-CM | POA: Diagnosis not present

## 2019-03-15 DIAGNOSIS — Z436 Encounter for attention to other artificial openings of urinary tract: Secondary | ICD-10-CM | POA: Insufficient documentation

## 2019-03-15 DIAGNOSIS — E1122 Type 2 diabetes mellitus with diabetic chronic kidney disease: Secondary | ICD-10-CM | POA: Diagnosis not present

## 2019-03-15 NOTE — ED Provider Notes (Signed)
-----------------------------------------   9:47 AM on 03/15/2019 -----------------------------------------  Patient's Foley is draining well, IR would prefer to change the catheter out tomorrow during his normal scheduled procedure, therefore given that he does have good bladder flow we will discharge with outpatient follow-up   Schuyler Amor, MD 03/15/19 [phone]

## 2019-03-15 NOTE — ED Provider Notes (Signed)
[facility] Emergency Department Provider Note  ____________________________________________   First MD Initiated Contact with Patient 03/15/19 0522     (approximate)  I have reviewed the triage vital signs and the nursing notes.   HISTORY  Chief Complaint catheter change    HPI Michael Bush is a 78 y.o. 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  He had a CT-guided suprapubic catheter placement about 4 weeks ago and sees Dr. Bernardo Heater for urology.  He presents tonight due to a malfunctioning suprapubic catheter.  He reports that he started feeling like he needed to urinate and his back did not seem to be as full as usual.  He subsequently started to leak urine from his penis.  He still feels like he needs to go and no urine is coming out of the suprapubic catheter.  His wife tried to fix it or irrigate it but it was causing a great deal of discomfort for him so they stopped and came to the ED.  He has had no other symptoms.  He denies fever, sore throat, chest pain, shortness of breath, nausea, vomiting, and abdominal pain other than the pressure around his bladder.  He has not been around anyone with COVID-19.  The malfunctioning of the suprapubic happened acutely and he is still feeling an urge to urinate although it is only moderate in intensity.        Past Medical History:  Diagnosis Date   Arthritis    CHF (congestive heart failure) (HCC)    Chronic kidney disease    chronic kidney disease   Colon cancer ([place]) 2003   Hx Partial colon resection, chemo + rad tx's.   COPD (chronic obstructive pulmonary disease) (HCC)    Coronary artery disease    Diabetes mellitus without complication (HCC)    Dysrhythmia    atrial fib., bradycardia   GERD (gastroesophageal reflux disease)    Hypertension    Hypothyroidism    Long term  (current) use of anticoagulants    Mixed hearing loss, unilateral    03/07/14   Myocardial infarction [facility])    Presence of permanent cardiac pacemaker    [employer] Accolade DDDR [phone]   Pseudophakia of both eyes    Sick sinus syndrome ([place]) 04/26/2014   Stroke [facility])     Patient Active Problem List   Diagnosis Date Noted   Acute cystitis without hematuria 07/27/2018   Left-sided weakness 07/26/2018   Incomplete bladder emptying 06/19/2018   Unstable angina ([place]) 03/07/2018   Personal history of kidney stones 10/13/2017   Hydronephrosis 10/13/2017   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   Diplopia 07/27/2017   Humerus lesion, left 06/01/2017   Hematuria, gross 12/06/2016   Nausea and vomiting 11/01/2015   Nodule of parotid gland 11/01/2015   Cerebral infarction ([place]) 07/23/2015   Cerebrovascular accident (CVA) due to vascular occlusion ([place]) 07/23/2015   Chronic obstructive pulmonary disease ([place]) 03/13/2015   Long term (current) use of anticoagulants 12/09/2014   Kidney stone 11/20/2014   Acute renal failure (ARF) ([place]) 10/09/2014   ARF (acute renal failure) ([place]) 10/09/2014   Bladder mass 10/09/2014   Cardiac pacemaker in situ 04/26/2014   Bradycardia 04/25/2014   Chronic suppurative otitis media 03/07/2014   Mixed conductive and sensorineural hearing loss, unspecified 03/07/2014   CAD (coronary artery disease) 01/15/2014   Essential hypertension 01/15/2014   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 Authorizing Provider  acetaminophen (TYLENOL) 325 MG tablet Take 650 mg by mouth every 6 (six) hours as needed for mild pain or moderate pain.  10/11/14   [provider]  albuterol (PROVENTIL HFA;VENTOLIN HFA) 108 (90 Base) MCG/ACT inhaler Inhale 2 puffs into the lungs every 6 (six) hours as needed. 10/15/18   Schaevitz, Randall An, MD  aspirin 81 MG chewable tablet Chew 81 mg by mouth every morning. 07/02/15   [provider]  atorvastatin (LIPITOR) 40 MG tablet TK 1 T PO D 05/02/18   [provider]  B Complex Vitamins (VITAMIN B COMPLEX PO) Take 2 tablets by mouth daily.    [provider]  Calcium Carb-Ergocalciferol 500-200 MG-UNIT TABS Take by mouth.    [provider]  citalopram (CELEXA) 20 MG tablet Take 20 mg by mouth daily. 03/14/17 03/14/18  [provider]  doxycycline (VIBRAMYCIN) 100 MG capsule Take 1 capsule (100 mg total) by mouth 2 (two) times daily. 10/15/18   Schaevitz, Randall An, MD  famotidine (PEPCID) 20 MG tablet Take 20 mg by mouth 2 (two) times daily.  10/14/17 10/14/18  [provider]  glipiZIDE (GLUCOTROL) 5 MG tablet Take 5 mg by mouth 2 (two) times daily.  12/08/17   [provider]  insulin aspart (NOVOLOG) 100 UNIT/ML FlexPen Take Insulin as directed by sliding scale 04/27/18   [provider]  Insulin Pen Needle (FIFTY50 PEN NEEDLES) 31G X 8 MM MISC Use as directed 04/27/18 04/27/19  [provider]  ketotifen (ZADITOR) 0.025 % ophthalmic solution INT 1 GTT IN OU BID FOR ALLERGIES OR ITCHING 11/29/17   [provider]  lactulose (CEPHULAC) 20 g packet Take 1 packet (20 g total) by mouth daily as needed (constipation). 12/22/17   Schaevitz, Randall An, MD  NEOMYCIN-POLYMYXIN-HYDROCORTISONE (CORTISPORIN) 1 % SOLN OTIC solution  04/27/18   [provider]  nitroGLYCERIN  (NITROSTAT) 0.4 MG SL tablet Place 1 tablet (0.4 mg total) under the tongue every 5 (five) minutes as needed for chest pain. 03/08/18   Bettey Costa, MD  Omega-3 Fatty Acids (KP FISH OIL) 1200 MG CAPS Take by mouth.    [provider]  sertraline (ZOLOFT) 25 MG tablet Take by mouth. 08/10/18   [provider]  sulfamethoxazole-trimethoprim (BACTRIM DS) 800-160 MG tablet Take 1 tablet by mouth every 12 (twelve) hours. Patient not taking: Reported on 02/12/2019 02/01/19   Abbie Sons, MD  tamsulosin (FLOMAX) 0.4 MG CAPS capsule Take 1 capsule (0.4 mg total) by mouth daily. 02/15/19   Stoioff, Ronda Fairly, MD  tiZANidine (ZANAFLEX) 2 MG tablet  11/25/16   [provider]  traMADol (ULTRAM) 50 MG tablet Take 1 tablet (50 mg total) by mouth every 6 (six) hours as needed. 10/12/18   Caryn Section Linden Dolin, PA-C  traZODone (DESYREL) 50 MG tablet Take by mouth. 08/10/18   [provider]  vitamin B-12 (CYANOCOBALAMIN) 1000  MCG tablet Take 1,000 mcg by mouth daily.     [provider]  warfarin (COUMADIN) 3 MG tablet Take 3 mg by mouth daily at 6 PM. Take 3MG  by mouth daily except on Friday take 5MG  by mouth    [provider]    Allergies Vancomycin, Dabigatran, Pollen extract, and Pradaxa [dabigatran etexilate mesylate]  Family History  Problem Relation Age of Onset   Cancer Father     Social History Social History   Tobacco Use   Smoking status: Former Smoker    Packs/day: 2.00    Years: 30.00    Pack years: 60.00    Types: Cigarettes   Smokeless tobacco: Never Used  Substance Use Topics   Alcohol use: No   Drug use: No    Review of Systems Constitutional: No fever/chills Eyes: No visual changes. ENT: No sore throat. Cardiovascular: Denies chest pain. Respiratory: Denies shortness of breath. Gastrointestinal: No abdominal pain.  No nausea, no vomiting.  No diarrhea.  No constipation. Genitourinary: Sense of urinary urgency and probable  malfunctioning suprapubic catheter. Musculoskeletal: Negative for neck pain.  Negative for back pain. Integumentary: Negative for rash. Neurological: Negative for headaches, focal weakness or numbness.   ____________________________________________   PHYSICAL EXAM:  VITAL SIGNS: ED Triage Vitals  Enc Vitals Group     BP 03/15/19 0514 (!) 150/70     Pulse Rate 03/15/19 0514 (!) 57     Resp 03/15/19 0514 18     Temp 03/15/19 0514 97.8 F (36.6 C)     Temp Source 03/15/19 0514 Oral     SpO2 03/15/19 0514 95 %     Weight 03/15/19 0515 86.2 kg (190 lb)     Height 03/15/19 0515 1.778 m (5\' 10" )     Head Circumference --      Peak Flow --      Pain Score 03/15/19 0521 0     Pain Loc --      Pain Edu? --      Excl. in [place]? --     Constitutional: Alert and oriented. Well appearing and in no acute distress.  Sequela of chronic illness. Eyes: Conjunctivae are normal.  Head: Atraumatic. Nose: No congestion/rhinnorhea. Mouth/Throat: Mucous membranes are moist. Neck: No stridor.  No meningeal signs.   Cardiovascular: Normal rate, regular rhythm. Good peripheral circulation. Grossly normal heart sounds. Respiratory: Normal respiratory effort.  No retractions. No audible wheezing. Gastrointestinal: Soft and nontender. No distention.  Genitourinary: Well appearing 12 French pigtail suprapubic catheter that does not appear to be draining with only minimal urine in the patient's leg bag.  Bladder scan reveals 118-mL, but it might not adequately represent the amount of urine in the bladder. Musculoskeletal: No lower extremity tenderness nor edema. No gross deformities of extremities. Neurologic:  Normal speech and language. No gross focal neurologic deficits are appreciated.  Skin:  Skin is warm, dry and intact. No rash noted. Psychiatric: Mood and affect are normal. Speech and behavior are normal.  ____________________________________________   LABS (all labs ordered are listed, but only  abnormal results are displayed)  Labs Reviewed - No data to display ____________________________________________  EKG  None - EKG not ordered by ED physician ____________________________________________  RADIOLOGY   ED MD interpretation:  No emergent imaging  Official radiology report(s): No results found.  ____________________________________________   PROCEDURES   Procedure(s) performed (including Critical Care):  Procedures   ____________________________________________   INITIAL IMPRESSION / MDM / ASSESSMENT AND PLAN / ED COURSE  As part of my medical decision making, I reviewed the following data within the [place] notes reviewed and incorporated, Labs reviewed , Old chart reviewed, Discussed with urologist, Discussed with radiologist and Notes from prior ED visits      *Caidin Keilan Nichol was evaluated in Emergency Department on 03/15/2019 for the symptoms described in the history of present illness. He was evaluated in the context of the global COVID-19 pandemic, which necessitated consideration that the patient might be at risk for infection with the SARS-CoV-2 virus that causes COVID-19. Institutional protocols and algorithms that pertain to the evaluation of patients at risk for COVID-19 are in a state of rapid change based on information released by regulatory bodies including the CDC and federal and state organizations. These policies and algorithms were followed during the patient's care in the ED.  Some ED evaluations and interventions may be delayed as a result of limited staffing during the pandemic.*  Differential diagnosis includes, but is not limited to, malfunctioning catheter due to physical damage to the catheter, blockage, displacement, UTI is less likely.  The patient has no infectious signs or symptoms.  He is in no obvious distress but he does report he feels like he has to urinate.  He has had overflow urination from the  urethra.  Since the 12 French pigtail catheter was placed only 4 weeks ago, I contacted Dr. Hollice Espy who is on-call for urology by [facility] secure chat.  I asked her if she thought it was appropriate for me to replace the suprapubic catheter and she recommended consulting interventional radiology because the urologist typically consult IR for the first few months for upsizing until they reach a size of 16 [place].  I paged the on-call interventional radiologist, Dr. Damita Dunnings, at approximately 6:10am and I am awaiting a call back.  Clinical Course as of Mar 14 729  Thu Mar 15, 2019  0635 I spoke by phone with Dr. Earleen Newport, the on-call interventional radiologist.  He let me know that we will have an interventional radiologist at area and see this morning.  Dr. Earleen Newport was actually the radiologist who performed this patient's procedure a month ago and remembers the patient.  He acknowledged the clinical scenario in which we find ourselves and that CT-guided replacement and upsizing of the catheter is appropriate.  He recommended I put in an order for a CT-guided drain placement and let the CT technologists know the reason and they will change the order to the correct one.  He will also let his colleagues know in the [facility] interventional radiologist will find the order for the procedure when they get to the hospital in a couple of hours.In the meantime Dr. Erlene Quan wrote back and pointed out that the patient can have a urethral catheter if necessary to relieve the pain and pressure.  I have updated the patient about the plan and he is feeling a great sense of urgency and pressure so we are placing a urethral Foley catheter in the meantime.  He agrees with the plan for the CT-guided procedure later this morning.   [CF]  [phone] Of note, I specifically asked Dr. Earleen Newport about whether or not rapid COVID testing was necessary for this procedure, and he and I both agree that, based on the current tiered guidelines, the  patient does not need testing because no aerosolizing procedure is being performed and the patient has no infectious signs or symptoms.  I think that is appropriate and I  will not send a nasal swab at this time.   [CF]    Clinical Course User Index [CF] Hinda Kehr, MD     ____________________________________________  FINAL CLINICAL IMPRESSION(S) / ED DIAGNOSES  Final diagnoses:  Suprapubic catheter dysfunction, initial encounter [facility])     MEDICATIONS GIVEN DURING THIS VISIT:  Medications - No data to display   ED Discharge Orders    None       Note:  This document was prepared using Dragon voice recognition software and may include unintentional dictation errors.   Hinda Kehr, MD 03/15/19 0730

## 2019-03-15 NOTE — ED Notes (Signed)
Bladder scan showed 118 mL.

## 2019-03-15 NOTE — Progress Notes (Signed)
I radiology nurse after reviewing patients chart from ED noting that wife brought him in this am for bladder discomfort. Patient is actually on VIR schedule to have suprapubic tube upsized 03/16/2019 at 0830. Noted that foley catheter placed in er per orders and patient stable. Spoke with Dr Jarrett Ables MD), regarding patient to come to specials tomorrow for planned procedure, and decision made to discharge patient to home today and come back in am for planned procedure. I called and talked with wife Michael Bush, at length regarding patient to come back in am for procedure with questions answered. Wife agrees that she feels that this would be ok and will return in am for his procedure and remove foley after procedure.thus this will be the plan for Michael Bush.also clarified this with Dr Vernard Gambles who is radiologist on call for today.

## 2019-03-15 NOTE — Discharge Instructions (Addendum)
Use the Foley in your penis until you can go to your appointment tomorrow at which time they will change out the catheter.  Return to the emergency room for any new or worrisome symptoms.

## 2019-03-15 NOTE — ED Notes (Signed)
Wife called and updated on plan to discharge and have procedure tomorrow

## 2019-03-16 ENCOUNTER — Ambulatory Visit
Admission: RE | Admit: 2019-03-16 | Discharge: 2019-03-16 | Disposition: A | Discharge status: Home / Self Care | Payer: Medicare Other | Source: Ambulatory Visit | Attending: Urology | Admitting: Urology

## 2019-03-16 ENCOUNTER — Other Ambulatory Visit: Payer: Self-pay

## 2019-03-16 ENCOUNTER — Encounter: Payer: Self-pay | Admitting: Interventional Radiology

## 2019-03-16 DIAGNOSIS — Z888 Allergy status to other drugs, medicaments and biological substances status: Secondary | ICD-10-CM | POA: Insufficient documentation

## 2019-03-16 DIAGNOSIS — E039 Hypothyroidism, unspecified: Secondary | ICD-10-CM | POA: Insufficient documentation

## 2019-03-16 DIAGNOSIS — Z87891 Personal history of nicotine dependence: Secondary | ICD-10-CM | POA: Diagnosis not present

## 2019-03-16 DIAGNOSIS — I252 Old myocardial infarction: Secondary | ICD-10-CM | POA: Diagnosis not present

## 2019-03-16 DIAGNOSIS — Z7982 Long term (current) use of aspirin: Secondary | ICD-10-CM | POA: Insufficient documentation

## 2019-03-16 DIAGNOSIS — Z7901 Long term (current) use of anticoagulants: Secondary | ICD-10-CM | POA: Diagnosis not present

## 2019-03-16 DIAGNOSIS — I2510 Atherosclerotic heart disease of native coronary artery without angina pectoris: Secondary | ICD-10-CM | POA: Diagnosis not present

## 2019-03-16 DIAGNOSIS — M1990 Unspecified osteoarthritis, unspecified site: Secondary | ICD-10-CM | POA: Diagnosis not present

## 2019-03-16 DIAGNOSIS — Z950 Presence of cardiac pacemaker: Secondary | ICD-10-CM | POA: Diagnosis not present

## 2019-03-16 DIAGNOSIS — Z8673 Personal history of transient ischemic attack (TIA), and cerebral infarction without residual deficits: Secondary | ICD-10-CM | POA: Diagnosis not present

## 2019-03-16 DIAGNOSIS — I509 Heart failure, unspecified: Secondary | ICD-10-CM | POA: Diagnosis not present

## 2019-03-16 DIAGNOSIS — R339 Retention of urine, unspecified: Secondary | ICD-10-CM | POA: Diagnosis not present

## 2019-03-16 DIAGNOSIS — Z436 Encounter for attention to other artificial openings of urinary tract: Secondary | ICD-10-CM | POA: Diagnosis present

## 2019-03-16 DIAGNOSIS — Z794 Long term (current) use of insulin: Secondary | ICD-10-CM | POA: Diagnosis not present

## 2019-03-16 DIAGNOSIS — N189 Chronic kidney disease, unspecified: Secondary | ICD-10-CM | POA: Diagnosis not present

## 2019-03-16 DIAGNOSIS — Z881 Allergy status to other antibiotic agents status: Secondary | ICD-10-CM | POA: Insufficient documentation

## 2019-03-16 DIAGNOSIS — K219 Gastro-esophageal reflux disease without esophagitis: Secondary | ICD-10-CM | POA: Diagnosis not present

## 2019-03-16 DIAGNOSIS — I130 Hypertensive heart and chronic kidney disease with heart failure and stage 1 through stage 4 chronic kidney disease, or unspecified chronic kidney disease: Secondary | ICD-10-CM | POA: Diagnosis not present

## 2019-03-16 DIAGNOSIS — Z79899 Other long term (current) drug therapy: Secondary | ICD-10-CM | POA: Diagnosis not present

## 2019-03-16 DIAGNOSIS — E119 Type 2 diabetes mellitus without complications: Secondary | ICD-10-CM | POA: Insufficient documentation

## 2019-03-16 DIAGNOSIS — J449 Chronic obstructive pulmonary disease, unspecified: Secondary | ICD-10-CM | POA: Insufficient documentation

## 2019-03-16 HISTORY — PX: IR CATHETER TUBE CHANGE: IMG717

## 2019-03-16 MED ORDER — FENTANYL CITRATE (PF) 100 MCG/2ML IJ SOLN
INTRAMUSCULAR | Status: AC | PRN
  Administered 2019-03-16: 25 ug via INTRAVENOUS
  Administered 2019-03-16: 50 ug via INTRAVENOUS

## 2019-03-16 MED ORDER — IODIXANOL 320 MG/ML IV SOLN: 50.0000 mL | Freq: Once | INTRAVENOUS | Status: DC | PRN

## 2019-03-16 MED ORDER — LIDOCAINE HCL (PF) 1 % IJ SOLN
5.0000 mL | Freq: Once | INTRAMUSCULAR | Status: DC
  Filled 2019-03-16: qty 5

## 2019-03-16 MED ORDER — LIDOCAINE HCL (PF) 1 % IJ SOLN
INTRAMUSCULAR | Status: AC | PRN
  Administered 2019-03-16: 10 mL

## 2019-03-16 MED ORDER — MIDAZOLAM HCL 5 MG/5ML IJ SOLN
INTRAMUSCULAR | Status: AC | PRN
  Administered 2019-03-16 (×2): 1 mg via INTRAVENOUS

## 2019-03-16 MED ORDER — FENTANYL CITRATE (PF) 100 MCG/2ML IJ SOLN
INTRAMUSCULAR | Status: AC
  Filled 2019-03-16: qty 2

## 2019-03-16 MED ORDER — MIDAZOLAM HCL 5 MG/5ML IJ SOLN
INTRAMUSCULAR | Status: AC
  Filled 2019-03-16: qty 5

## 2019-03-16 NOTE — Procedures (Signed)
Chief Complaint: Malfunctioning suprapubic catheter  Referring Physician(s): Stoioff,Scott C  Supervising Physician: Corrie Mckusick  Patient Status: [facility] - Out-pt  History of Present Illness: Michael Bush is a 78 y.o. male presenting to [facility] as a scheduled outpatient to trouble-shoot a malfunctioning supra-pubic catheter.   He has urinary retention history, with initiatl placement of a suprapubic catheter 02/12/2019, with 69F catheter.    He arrived at the ED recently with blocked catheter and urinary retention, and foley catheter was placed.  He arrives today as scheduled appointment for replacement/upsize.   Past Medical History:  Diagnosis Date  . Arthritis   . CHF (congestive heart failure) ([place])   . Chronic kidney disease    chronic kidney disease  . Colon cancer ([place]) 2003   Hx Partial colon resection, chemo + rad tx's.  Marland Kitchen COPD (chronic obstructive pulmonary disease) ([place])   . Coronary artery disease   . Diabetes mellitus without complication ([place])   . Dysrhythmia    atrial fib., bradycardia  . GERD (gastroesophageal reflux disease)   . Hypertension   . Hypothyroidism   . Long term (current) use of anticoagulants   . Mixed hearing loss, unilateral    03/07/14  . Myocardial infarction ([place])   . Presence of permanent cardiac pacemaker    [employer] Accolade DDDR [phone]  . Pseudophakia of both eyes   . Sick sinus syndrome ([place]) 04/26/2014  . Stroke [facility])     Past Surgical History:  Procedure Laterality Date  . Cardioversion External    . Cataract cortical ssenile    . COLON SURGERY    . COLON SURGERY    . COLONOSCOPY WITH PROPOFOL N/A 01/27/2018   Procedure: COLONOSCOPY WITH PROPOFOL;  Surgeon: Lollie Sails, MD;  Location: [facility] ENDOSCOPY;  Service: Endoscopy;  Laterality: N/A;  . CORONARY ANGIOPLASTY    . Insertion dual chamber pacemaker generator      Allergies: Vancomycin, Dabigatran, Pollen extract, and Pradaxa [dabigatran etexilate  mesylate]  Medications: Prior to Admission medications   Medication Sig Start Date End Date Taking? Authorizing Provider  acetaminophen (TYLENOL) 325 MG tablet Take 650 mg by mouth every 6 (six) hours as needed for mild pain or moderate pain.  10/11/14   [provider]  albuterol (PROVENTIL HFA;VENTOLIN HFA) 108 (90 Base) MCG/ACT inhaler Inhale 2 puffs into the lungs every 6 (six) hours as needed. 10/15/18   Schaevitz, Randall An, MD  aspirin 81 MG chewable tablet Chew 81 mg by mouth every morning. 07/02/15   [provider]  atorvastatin (LIPITOR) 40 MG tablet TK 1 T PO D 05/02/18   [provider]  B Complex Vitamins (VITAMIN B COMPLEX PO) Take 2 tablets by mouth daily.    [provider]  Calcium Carb-Ergocalciferol 500-200 MG-UNIT TABS Take by mouth.    [provider]  citalopram (CELEXA) 20 MG tablet Take 20 mg by mouth daily. 03/14/17 03/14/18  [provider]  doxycycline (VIBRAMYCIN) 100 MG capsule Take 1 capsule (100 mg total) by mouth 2 (two) times daily. 10/15/18   Schaevitz, Randall An, MD  famotidine (PEPCID) 20 MG tablet Take 20 mg by mouth 2 (two) times daily.  10/14/17 10/14/18  [provider]  glipiZIDE (GLUCOTROL) 5 MG tablet Take 5 mg by mouth 2 (two) times daily.  12/08/17   [provider]  insulin aspart (NOVOLOG) 100 UNIT/ML FlexPen Take Insulin as directed by sliding scale 04/27/18   [provider]  Insulin Pen Needle (FIFTY50  PEN NEEDLES) 31G X 8 MM MISC Use as directed 04/27/18 04/27/19  [provider]  ketotifen (ZADITOR) 0.025 % ophthalmic solution INT 1 GTT IN OU BID FOR ALLERGIES OR ITCHING 11/29/17   [provider]  lactulose (CEPHULAC) 20 g packet Take 1 packet (20 g total) by mouth daily as needed (constipation). 12/22/17   Schaevitz, Randall An, MD  NEOMYCIN-POLYMYXIN-HYDROCORTISONE (CORTISPORIN) 1 % SOLN OTIC solution  04/27/18   [provider]  nitroGLYCERIN  (NITROSTAT) 0.4 MG SL tablet Place 1 tablet (0.4 mg total) under the tongue every 5 (five) minutes as needed for chest pain. 03/08/18   Bettey Costa, MD  Omega-3 Fatty Acids (KP FISH OIL) 1200 MG CAPS Take by mouth.    [provider]  sertraline (ZOLOFT) 25 MG tablet Take by mouth. 08/10/18   [provider]  sulfamethoxazole-trimethoprim (BACTRIM DS) 800-160 MG tablet Take 1 tablet by mouth every 12 (twelve) hours. Patient not taking: Reported on 02/12/2019 02/01/19   Abbie Sons, MD  tamsulosin (FLOMAX) 0.4 MG CAPS capsule Take 1 capsule (0.4 mg total) by mouth daily. 02/15/19   Stoioff, Ronda Fairly, MD  tiZANidine (ZANAFLEX) 2 MG tablet  11/25/16   [provider]  traMADol (ULTRAM) 50 MG tablet Take 1 tablet (50 mg total) by mouth every 6 (six) hours as needed. 10/12/18   Caryn Section Linden Dolin, PA-C  traZODone (DESYREL) 50 MG tablet Take by mouth. 08/10/18   [provider]  vitamin B-12 (CYANOCOBALAMIN) 1000 MCG tablet Take 1,000 mcg by mouth daily.     [provider]  warfarin (COUMADIN) 3 MG tablet Take 3 mg by mouth daily at 6 PM. Take 3MG  by mouth daily except on Friday take 5MG  by mouth    [provider]     Family History  Problem Relation Age of Onset  . Cancer Father     Social History   Socioeconomic History  . Marital status: Married    Spouse name: Not on file  . Number of children: Not on file  . Years of education: Not on file  . Highest education level: Not on file  Occupational History  . Not on file  Social Needs  . Financial resource strain: Not on file  . Food insecurity    Worry: Not on file    Inability: Not on file  . Transportation needs    Medical: Not on file    Non-medical: Not on file  Tobacco Use  . Smoking status: Former Smoker    Packs/day: 2.00    Years: 30.00    Pack years: 60.00    Types: Cigarettes  . Smokeless tobacco: Never Used  Substance and Sexual Activity  . Alcohol use: No  . Drug use:  No  . Sexual activity: Not Currently  Lifestyle  . Physical activity    Days per week: Not on file    Minutes per session: Not on file  . Stress: Not on file  Relationships  . Social Herbalist on phone: Not on file    Gets together: Not on file    Attends religious service: Not on file    Active member of club or organization: Not on file    Attends meetings of clubs or organizations: Not on file    Relationship status: Not on file  Other Topics Concern  . Not on file  Social History Narrative  . Not on file      Review of  Systems: A 12 point ROS discussed and pertinent positives are indicated in the HPI above.  All other systems are negative.  Review of Systems  Vital Signs: Resp 20   Wt 85.7 kg   SpO2 98%   BMI 27.12 kg/m   Physical Exam General: 78 yo male appearing stated age.  Well-developed, well-nourished.  No distress. HEENT: Atraumatic, normocephalic.  Conjugate gaze, extra-ocular motor intact. No scleral icterus or scleral injection. No lesions on external ears, nose, lips, or gums.  Oral mucosa moist, pink.  Neck: Symmetric with no goiter enlargement.  Chest/Lungs:  Symmetric chest with inspiration/expiration.  No labored breathing.  Clear to auscultation with no wheezes, rhonchi, or rales.  Heart:  RRR, with no third heart sounds appreciated. No JVD appreciated.  Abdomen:  Soft, NT/ND, with + bowel sounds.   Genito-urinary: Deferred   Imaging: No results found.  Labs:  CBC: Recent Labs    04/21/18 1415 10/11/18 1708 10/15/18 0750 02/12/19 1034  WBC 10.5 7.5 8.0 6.0  HGB 15.0 14.5 13.4 15.6  HCT 44.3 45.0 41.8 50.2  PLT 226 229 214 228    COAGS: Recent Labs    04/21/18 1415 10/11/18 1708 10/15/18 0750 02/12/19 1034  INR 2.94 1.65 2.08 1.8*  APTT  --   --   --  44*    BMP: Recent Labs    04/21/18 1415 10/11/18 1708 10/15/18 0750  NA 138 138 137  K 4.5 3.9 4.1  CL 100 106 104  CO2 26 24 27   GLUCOSE 302* 159* 239*   BUN 17 14 14   CALCIUM 9.5 8.8* 8.9  CREATININE 1.09 0.92 0.86  GFRNONAA >60 >60 >60  GFRAA >60 >60 >60    LIVER FUNCTION TESTS: No results for input(s): BILITOT, AST, ALT, ALKPHOS, PROT, ALBUMIN in the last 8760 hours.  TUMOR MARKERS: No results for input(s): AFPTM, CEA, CA199, CHROMGRNA in the last 8760 hours.  Assessment and Plan:  78 yo male presents for upsizing/exchange of non-functional supra-pubic catheter.   Risks and benefits discussed with the patient including bleeding, infection, damage to adjacent structures, bowel perforation/fistula connection, and sepsis.  All of the patient's questions were answered, patient is agreeable to proceed. Consent signed and in chart.     .  Electronically Signed: Corrie Mckusick, DO 03/16/2019, 9:22 AM   I spent a total of    15 Minutes in face to face in clinical consultation, greater than 50% of which was counseling/coordinating care for supra-pubic catheter exchange, urinary retention.

## 2019-03-16 NOTE — Procedures (Signed)
Interventional Radiology Procedure Note  Procedure: Upsize of 90F supra-pubic catheter, occluded, for a new 54F suprapubic catheter.  Complications: None Recommendations:  - to gravity - 1 hr recovery - Routine care   Signed,  Dulcy Fanny. Earleen Newport, DO

## 2019-03-18 ENCOUNTER — Other Ambulatory Visit: Payer: Self-pay

## 2019-03-18 ENCOUNTER — Emergency Department: Payer: Medicare Other

## 2019-03-18 ENCOUNTER — Encounter: Payer: Self-pay | Admitting: Emergency Medicine

## 2019-03-18 ENCOUNTER — Emergency Department
Admission: EM | Admit: 2019-03-18 | Discharge: 2019-03-18 | Disposition: A | Discharge status: Home / Self Care | Payer: Medicare Other | Attending: Emergency Medicine | Admitting: Emergency Medicine

## 2019-03-18 DIAGNOSIS — I509 Heart failure, unspecified: Secondary | ICD-10-CM | POA: Insufficient documentation

## 2019-03-18 DIAGNOSIS — R509 Fever, unspecified: Secondary | ICD-10-CM | POA: Diagnosis present

## 2019-03-18 DIAGNOSIS — Z7982 Long term (current) use of aspirin: Secondary | ICD-10-CM | POA: Insufficient documentation

## 2019-03-18 DIAGNOSIS — Z85038 Personal history of other malignant neoplasm of large intestine: Secondary | ICD-10-CM | POA: Diagnosis not present

## 2019-03-18 DIAGNOSIS — N189 Chronic kidney disease, unspecified: Secondary | ICD-10-CM | POA: Diagnosis not present

## 2019-03-18 DIAGNOSIS — J449 Chronic obstructive pulmonary disease, unspecified: Secondary | ICD-10-CM | POA: Diagnosis not present

## 2019-03-18 DIAGNOSIS — Z794 Long term (current) use of insulin: Secondary | ICD-10-CM | POA: Insufficient documentation

## 2019-03-18 DIAGNOSIS — E1122 Type 2 diabetes mellitus with diabetic chronic kidney disease: Secondary | ICD-10-CM | POA: Insufficient documentation

## 2019-03-18 DIAGNOSIS — Z79899 Other long term (current) drug therapy: Secondary | ICD-10-CM | POA: Diagnosis not present

## 2019-03-18 DIAGNOSIS — N390 Urinary tract infection, site not specified: Secondary | ICD-10-CM | POA: Insufficient documentation

## 2019-03-18 DIAGNOSIS — Z20828 Contact with and (suspected) exposure to other viral communicable diseases: Secondary | ICD-10-CM | POA: Insufficient documentation

## 2019-03-18 DIAGNOSIS — Z8673 Personal history of transient ischemic attack (TIA), and cerebral infarction without residual deficits: Secondary | ICD-10-CM | POA: Insufficient documentation

## 2019-03-18 DIAGNOSIS — Z950 Presence of cardiac pacemaker: Secondary | ICD-10-CM | POA: Insufficient documentation

## 2019-03-18 DIAGNOSIS — Z87891 Personal history of nicotine dependence: Secondary | ICD-10-CM | POA: Diagnosis not present

## 2019-03-18 DIAGNOSIS — E039 Hypothyroidism, unspecified: Secondary | ICD-10-CM | POA: Diagnosis not present

## 2019-03-18 DIAGNOSIS — I252 Old myocardial infarction: Secondary | ICD-10-CM | POA: Diagnosis not present

## 2019-03-18 DIAGNOSIS — I2510 Atherosclerotic heart disease of native coronary artery without angina pectoris: Secondary | ICD-10-CM | POA: Insufficient documentation

## 2019-03-18 DIAGNOSIS — I130 Hypertensive heart and chronic kidney disease with heart failure and stage 1 through stage 4 chronic kidney disease, or unspecified chronic kidney disease: Secondary | ICD-10-CM | POA: Insufficient documentation

## 2019-03-18 LAB — COMPREHENSIVE METABOLIC PANEL
ALT: 16 U/L (ref 0–44)
AST: 16 U/L (ref 15–41)
Albumin: 3.3 g/dL — ABNORMAL LOW (ref 3.5–5.0)
Alkaline Phosphatase: 67 U/L (ref 38–126)
Anion gap: 10 (ref 5–15)
BUN: 15 mg/dL (ref 8–23)
CO2: 24 mmol/L (ref 22–32)
Calcium: 8.7 mg/dL — ABNORMAL LOW (ref 8.9–10.3)
Chloride: 104 mmol/L (ref 98–111)
Creatinine, Ser: 0.78 mg/dL (ref 0.61–1.24)
GFR calc Af Amer: >60 mL/min (ref >60)
GFR calc non Af Amer: >60 mL/min (ref >60)
Glucose, Bld: 159 mg/dL — ABNORMAL HIGH (ref 70–99)
Potassium: 3.8 mmol/L (ref 3.5–5.1)
Sodium: 138 mmol/L (ref 135–145)
Total Bilirubin: 0.8 mg/dL (ref 0.3–1.2)
Total Protein: 7.5 g/dL (ref 6.5–8.1)

## 2019-03-18 LAB — CBC WITH DIFFERENTIAL/PLATELET
Abs Immature Granulocytes: 0.04 10*3/uL (ref 0.00–0.07)
Basophils Absolute: 0 10*3/uL (ref 0.0–0.1)
Basophils Relative: 1 %
Eosinophils Absolute: 0.2 10*3/uL (ref 0.0–0.5)
Eosinophils Relative: 2 %
HCT: 42.4 % (ref 39.0–52.0)
Hemoglobin: 13.9 g/dL (ref 13.0–17.0)
Immature Granulocytes: 1 %
Lymphocytes Relative: 14 %
Lymphs Abs: 1.2 10*3/uL (ref 0.7–4.0)
MCH: 28.7 pg (ref 26.0–34.0)
MCHC: 32.8 g/dL (ref 30.0–36.0)
MCV: 87.6 fL (ref 80.0–100.0)
Monocytes Absolute: 0.7 10*3/uL (ref 0.1–1.0)
Monocytes Relative: 9 %
Neutro Abs: 6.4 10*3/uL (ref 1.7–7.7)
Neutrophils Relative %: 73 %
Platelets: 204 10*3/uL (ref 150–400)
RBC: 4.84 MIL/uL (ref 4.22–5.81)
RDW: 14.7 % (ref 11.5–15.5)
WBC: 8.5 10*3/uL (ref 4.0–10.5)
nRBC: 0 % (ref 0.0–0.2)

## 2019-03-18 LAB — URINALYSIS, ROUTINE W REFLEX MICROSCOPIC
Bilirubin Urine: NEGATIVE
Glucose, UA: 50 mg/dL — AB
Ketones, ur: NEGATIVE mg/dL
Nitrite: POSITIVE — AB
Protein, ur: 100 mg/dL — AB
RBC / HPF: >50 RBC/hpf — ABNORMAL HIGH (ref 0–5)
Specific Gravity, Urine: 1.028 (ref 1.005–1.030)
WBC, UA: >50 WBC/hpf — ABNORMAL HIGH (ref 0–5)
pH: 5 (ref 5.0–8.0)

## 2019-03-18 LAB — LACTIC ACID, PLASMA: Lactic Acid, Venous: 1.4 mmol/L (ref 0.5–1.9)

## 2019-03-18 LAB — SARS CORONAVIRUS 2 BY RT PCR (HOSPITAL ORDER, PERFORMED IN ~~LOC~~ HOSPITAL LAB): SARS Coronavirus 2: NEGATIVE

## 2019-03-18 MED ORDER — PIPERACILLIN-TAZOBACTAM 3.375 G IVPB 30 MIN
3.3750 g | Freq: Once | INTRAVENOUS | Status: AC
  Administered 2019-03-18: 3.375 g via INTRAVENOUS
  Filled 2019-03-18: qty 50

## 2019-03-18 MED ORDER — SODIUM CHLORIDE 0.9 % IV SOLN
2.0000 g | Freq: Once | INTRAVENOUS | Status: AC
  Administered 2019-03-18: 2 g via INTRAVENOUS
  Filled 2019-03-18: qty 2

## 2019-03-18 MED ORDER — IOHEXOL 300 MG/ML  SOLN
75.0000 mL | Freq: Once | INTRAMUSCULAR | Status: AC | PRN
  Administered 2019-03-18: 75 mL via INTRAVENOUS

## 2019-03-18 MED ORDER — CEFDINIR 300 MG PO CAPS: 300.0000 mg | ORAL_CAPSULE | Freq: Two times a day (BID) | ORAL | 0 refills | Status: DC

## 2019-03-18 MED ORDER — IOHEXOL 240 MG/ML SOLN
50.0000 mL | Freq: Once | INTRAMUSCULAR | Status: AC | PRN
  Administered 2019-03-18: 50 mL via ORAL

## 2019-03-18 NOTE — ED Provider Notes (Signed)
[facility] Emergency Department Provider Note  Time seen: 12:58 PM  I have reviewed the triage vital signs and the nursing notes.   HISTORY  Chief Complaint Fever Lower abdominal pain Hematuria  HPI Michael Bush is a 78 y.o. male with a past medical history of CHF, CKD, COPD, diabetes, MI, multiple strokes, now wheelchair-bound, presents to the emergency department for fever to 103 last night, lower abdominal pain, and hematuria.  According to the patient and wife over the past 2 days patient has had an intermittent fever, as high as 103 last night.  Patient had a suprapubic catheter exchange performed 2 days ago.  Has had some mild hematuria in the urine bag however they told them this was normal and is improving.  Patient has had a mild cough at home.  No significant shortness of breath.  No chest pain.  Patient does complain of moderate dull lower abdominal pain around the catheter insertion site.  Past Medical History:  Diagnosis Date  . Arthritis   . CHF (congestive heart failure) ([place])   . Chronic kidney disease    chronic kidney disease  . Colon cancer ([place]) 2003   Hx Partial colon resection, chemo + rad tx's.  Marland Kitchen COPD (chronic obstructive pulmonary disease) ([place])   . Coronary artery disease   . Diabetes mellitus without complication ([place])   . Dysrhythmia    atrial fib., bradycardia  . GERD (gastroesophageal reflux disease)   . Hypertension   . Hypothyroidism   . Long term (current) use of anticoagulants   . Mixed hearing loss, unilateral    03/07/14  . Myocardial infarction ([place])   . Presence of permanent cardiac pacemaker    [employer] DDDR [phone]  . Pseudophakia of both eyes   . Sick sinus syndrome ([place]) 04/26/2014  . Stroke [facility])     Patient Active Problem List   Diagnosis Date Noted  . Acute cystitis without hematuria 07/27/2018  . Left-sided weakness 07/26/2018  . Incomplete bladder emptying 06/19/2018  . Unstable  angina ([place]) 03/07/2018  . Personal history of kidney stones 10/13/2017  . Hydronephrosis 10/13/2017  . 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 Diplopia 07/27/2017  . Humerus lesion, left 06/01/2017  . Hematuria, gross 12/06/2016  . Nausea and vomiting 11/01/2015  . Nodule of parotid gland 11/01/2015  . Cerebral infarction ([place]) 07/23/2015  . Cerebrovascular accident (CVA) due to vascular occlusion ([place]) 07/23/2015  . Chronic obstructive pulmonary disease ([place]) 03/13/2015  . Long term (current) use of anticoagulants 12/09/2014  . Kidney stone 11/20/2014  . Acute renal failure (ARF) ([place]) 10/09/2014  . ARF (acute renal failure) ([place]) 10/09/2014  . Bladder mass 10/09/2014  . Cardiac pacemaker in situ 04/26/2014  . Bradycardia 04/25/2014  . Chronic suppurative otitis media 03/07/2014  . Mixed conductive and sensorineural hearing loss, unspecified 03/07/2014  . CAD (coronary artery disease) 01/15/2014  . Essential hypertension 01/15/2014  . 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 Neuropathy 01/15/2014  . Atrial fibrillation ([place]) 12/04/2013    Past Surgical History:  Procedure Laterality Date  . Cardioversion External    . Cataract cortical ssenile    . COLON SURGERY    . COLON SURGERY    . COLONOSCOPY WITH PROPOFOL N/A 01/27/2018   Procedure: COLONOSCOPY WITH PROPOFOL;  Surgeon: Lollie Sails, MD;  Location: [facility] ENDOSCOPY;  Service: Endoscopy;  Laterality: N/A;  . CORONARY ANGIOPLASTY    . Insertion dual chamber pacemaker generator    . IR CATHETER TUBE CHANGE  03/16/2019    Prior to Admission medications   Medication Sig Start Date End Date Taking? Authorizing Provider  acetaminophen (TYLENOL) 325 MG tablet Take 650 mg by mouth every 6 (six) hours as needed for mild pain or moderate pain.  10/11/14   [provider]  albuterol (PROVENTIL HFA;VENTOLIN HFA) 108 (90 Base) MCG/ACT inhaler Inhale 2 puffs into the lungs every 6 (six) hours as needed. 10/15/18   Schaevitz, Randall An,  MD  aspirin 81 MG chewable tablet Chew 81 mg by mouth every morning. 07/02/15   [provider]  atorvastatin (LIPITOR) 40 MG tablet TK 1 T PO D 05/02/18   [provider]  B Complex Vitamins (VITAMIN B COMPLEX PO) Take 2 tablets by mouth daily.    [provider]  Calcium Carb-Ergocalciferol 500-200 MG-UNIT TABS Take by mouth.    [provider]  citalopram (CELEXA) 20 MG tablet Take 20 mg by mouth daily. 03/14/17 03/14/18  [provider]  doxycycline (VIBRAMYCIN) 100 MG capsule Take 1 capsule (100 mg total) by mouth 2 (two) times daily. 10/15/18   Schaevitz, Randall An, MD  famotidine (PEPCID) 20 MG tablet Take 20 mg by mouth 2 (two) times daily.  10/14/17 10/14/18  [provider]  glipiZIDE (GLUCOTROL) 5 MG tablet Take 5 mg by mouth 2 (two) times daily.  12/08/17   [provider]  insulin aspart (NOVOLOG) 100 UNIT/ML FlexPen Take Insulin as directed by sliding scale 04/27/18   [provider]  Insulin Pen Needle (FIFTY50 PEN NEEDLES) 31G X 8 MM MISC Use as directed 04/27/18 04/27/19  [provider]  ketotifen (ZADITOR) 0.025 % ophthalmic solution INT 1 GTT IN OU BID FOR ALLERGIES OR ITCHING 11/29/17   [provider]  lactulose (CEPHULAC) 20 g packet Take 1 packet (20 g total) by mouth daily as needed (constipation). 12/22/17   Schaevitz, Randall An, MD  NEOMYCIN-POLYMYXIN-HYDROCORTISONE (CORTISPORIN) 1 % SOLN OTIC solution  04/27/18   [provider]  nitroGLYCERIN (NITROSTAT) 0.4 MG SL tablet Place 1 tablet (0.4 mg total) under the tongue every 5 (five) minutes as needed for chest pain. 03/08/18   Bettey Costa, MD  Omega-3 Fatty Acids (KP FISH OIL) 1200 MG CAPS Take by mouth.    [provider]  sertraline (ZOLOFT) 25 MG tablet Take by mouth. 08/10/18   [provider]  sulfamethoxazole-trimethoprim (BACTRIM DS) 800-160 MG tablet Take 1 tablet by mouth every 12 (twelve) hours. Patient not  taking: Reported on 02/12/2019 02/01/19   Abbie Sons, MD  tamsulosin (FLOMAX) 0.4 MG CAPS capsule Take 1 capsule (0.4 mg total) by mouth daily. 02/15/19   Stoioff, Ronda Fairly, MD  tiZANidine (ZANAFLEX) 2 MG tablet  11/25/16   [provider]  traMADol (ULTRAM) 50 MG tablet Take 1 tablet (50 mg total) by mouth every 6 (six) hours as needed. 10/12/18   Caryn Section Linden Dolin, PA-C  traZODone (DESYREL) 50 MG tablet Take by mouth. 08/10/18   [provider]  vitamin B-12 (CYANOCOBALAMIN) 1000 MCG tablet Take 1,000 mcg by mouth daily.     [provider]  warfarin (COUMADIN) 3 MG tablet Take 3 mg by mouth daily at 6 PM. Take 3MG  by mouth daily except on Friday take 5MG  by mouth    [provider]    Allergies  Allergen Reactions  . Vancomycin Swelling    Facial swelling  . Dabigatran Other (See Comments)    Bleeding complications Bleeding complications   . Pollen  Extract   . Pradaxa [Dabigatran Etexilate Mesylate]     Family History  Problem Relation Age of Onset  . Cancer Father     Social History Social History   Tobacco Use  . Smoking status: Former Smoker    Packs/day: 2.00    Years: 30.00    Pack years: 60.00    Types: Cigarettes  . Smokeless tobacco: Never Used  Substance Use Topics  . Alcohol use: No  . Drug use: No    Review of Systems Constitutional: Positive for fever size 103. ENT: Negative for recent illness/congestion Cardiovascular: Negative for chest pain. Respiratory: Negative for shortness of breath.  Occasional cough Gastrointestinal: Moderate dull lower abdominal pain. Genitourinary: Improving hematuria and leg bag. Musculoskeletal: Negative for musculoskeletal complaints Skin: Negative for skin complaints  Neurological: Negative for headache All other ROS negative  ____________________________________________   PHYSICAL EXAM:  VITAL SIGNS: ED Triage Vitals  Enc Vitals Group     BP 03/18/19 1214 138/71     Pulse Rate  03/18/19 1214 62     Resp 03/18/19 1214 16     Temp 03/18/19 1214 97.9 F (36.6 C)     Temp Source 03/18/19 1214 Oral     SpO2 03/18/19 1214 97 %     Weight 03/18/19 1213 190 lb (86.2 kg)     Height 03/18/19 1213 5\' 10"  (1.778 m)     Head Circumference --      Peak Flow --      Pain Score --      Pain Loc --      Pain Edu? --      Excl. in [place]? --    Constitutional: Alert and oriented. Well appearing and in no distress. Eyes: Normal exam ENT      Head: Normocephalic and atraumatic.      Mouth/Throat: Mucous membranes are moist. Cardiovascular: Normal rate, regular rhythm.  Respiratory: Normal respiratory effort without tachypnea nor retractions. Breath sounds are clear  Gastrointestinal: Soft, moderate suprapubic tenderness palpation, especially around suprapubic catheter insertion site.  No obvious signs of cellulitis, no erythema or pus drainage. Musculoskeletal: Nontender with normal range of motion in all extremities.  Neurologic:  Normal speech and language. No gross focal neurologic deficits  Skin:  Skin is warm, dry and intact.  Psychiatric: Mood and affect are normal.   ____________________________________________    EKG  EKG viewed and interpreted by myself shows a ventricular paced rhythm at 61 bpm with a widened QRS, largely normal intervals with nonspecific ST changes.  ____________________________________________    RADIOLOGY  IMPRESSION:  Mildly increased interstitial markings which may be seen with  pulmonary vascular congestion.   Stably enlarged cardiac silhouette.   ____________________________________________   INITIAL IMPRESSION / ASSESSMENT AND PLAN / ED COURSE  Pertinent labs & imaging results that were available during my care of the patient were reviewed by me and considered in my medical decision making (see chart for details).   Patient presents to the emergency department for fever as high as 103, lower abdominal pain.  Differential at  this time would include sepsis, urinary tract infection, intra-abdominal infection or abscess, pneumonia, COVID.  We will check labs, start broad-spectrum antibiotics and continue to closely monitor.  Patient wife agreeable to plan of care.  Patient's labs have resulted showing normal white blood cell count, largely normal chemistry and CBC overall.  Patient's urinalysis is consistent with urinary tract infection however this is from a chronic indwelling catheter.  Urine culture  has been sent.  Overall the patient appears well, afebrile with reassuring vitals otherwise.  Covid test is negative.  Patient has received IV antibiotics.  CT scan of the abdomen is pending.  Patient care signed out to oncoming physician.  If the CT is nonrevealing the patient remains this well-appearing I believe he could possibly be discharged home with antibiotics, if the CT shows an intra-abdominal infection patient will require admission.  Herny Zong Mcquarrie was evaluated in Emergency Department on 03/18/2019 for the symptoms described in the history of present illness. He was evaluated in the context of the global COVID-19 pandemic, which necessitated consideration that the patient might be at risk for infection with the SARS-CoV-2 virus that causes COVID-19. Institutional protocols and algorithms that pertain to the evaluation of patients at risk for COVID-19 are in a state of rapid change based on information released by regulatory bodies including the CDC and federal and state organizations. These policies and algorithms were followed during the patient's care in the ED.  ____________________________________________   FINAL CLINICAL IMPRESSION(S) / ED DIAGNOSES  Fever Abdominal pain Urinary tract infection   Harvest Dark, MD 03/18/19 1501

## 2019-03-18 NOTE — ED Notes (Signed)
MD in room to update patient on plan of care.  Will continue to monitor.

## 2019-03-18 NOTE — ED Provider Notes (Addendum)
-----------------------------------------   3:50 PM on 03/18/2019 -----------------------------------------  Signed out to me by Dr. Kerman Passey at 345.  Patient with CT scan pending.  CT scan is resulted.  It is positive for some inflammatory track along the recently placed Foley.  Unclear if this is contributing to his fevers.  Also clearly has UTI and has received antibiotics.  Have the catheter placed on Friday and fevers began that evening.  He is well-appearing his abdomen is nonsurgical.  CT results are noted.  He has no right upper quadrant abdominal pain at all think this is cholecystitis and he has minimal pain around the area.  There is some very slight erythema just at the area of incision but nothing spreading it looks like a cellulitis.  CT scan shows some issues with his pancreas I talked to the family they are aware of it already but will follow-up.  Awaiting callback from urology.  ----------------------------------------- 4:18 PM on 03/18/2019 -----------------------------------------  Talk to Dr. Jeffie Pollock, appreciate consult.  He would like the patient be discharged and will follow-up in the next few days.  Patient is on Coumadin and we looked back at prior cultures.  This does present something of a challenge I think Omnicef would be the best medication for him in the short-term given what he is received here thus far in the emergency room, the fact that he is on Coumadin, and the sensitivities of his last culture that was positive in September.  It made aware of findings on CT scan and the need for follow-up on that, their strong preference is to go home given the possibility of coronavirus.  He has been cultured, which will guide our care and return precautions follow-up given and understood.   Schuyler Amor, MD 03/18/19 1553    Schuyler Amor, MD 03/18/19 [phone]

## 2019-03-18 NOTE — ED Triage Notes (Signed)
Pt sent from PCP for R/O UTI. Newly placed suprapubic catheter last week and pt is now having hematuria and fevers.

## 2019-03-18 NOTE — ED Notes (Signed)
Patient returned from CT. Will continue to monitor.

## 2019-03-18 NOTE — Progress Notes (Signed)
CODE SEPSIS - PHARMACY COMMUNICATION  **Broad Spectrum Antibiotics should be administered within 1 hour of Sepsis diagnosis**  Time Code Sepsis Called/Page Received: 1258  Antibiotics Ordered: Cefepime   Time of 1st antibiotic administration: Cefepime 1329  Simpson,Michael L ,RPh 03/18/2019  1:43 PM

## 2019-03-18 NOTE — Discharge Instructions (Addendum)
Take the antibiotics as prescribed if you have high fever, or you feel worse in any way return to the emergency room this and include increased pain in your abdomen or vomiting.  Please follow closely with your primary care doctor for a Coumadin check.  Please call Dr. Bernardo Heater first thing in the morning for repeat appointment.  Please take the antibiotics till they are gone.  The urologist may opt to use antibiotics longer than we have prescribed.  If the culture shows that we need to change antibiotics we will call you.  CT scan showed that you have a lesion in your pancreas which is been there for some time but we do asked that you follow closely with your primary care doctor to have that checked again when you talk to them.  Also if you have any pain in the upper abdomen which is different from what you are experiencing at this time it would be cause for concern.  Otherwise, return to the emergency room if you feel worse in any way.

## 2019-03-19 ENCOUNTER — Telehealth: Payer: Self-pay

## 2019-03-19 LAB — URINE CULTURE

## 2019-03-19 NOTE — Telephone Encounter (Signed)
Pt seen in ER

## 2019-03-23 LAB — CULTURE, BLOOD (ROUTINE X 2)
Culture: NO GROWTH
Culture: NO GROWTH
Special Requests: ADEQUATE
Special Requests: ADEQUATE

## 2019-03-25 NOTE — Progress Notes (Signed)
03/26/2019 4:27 PM   Michael Bush Jul 22, 1941 [id number]  Referring provider: Clarisse Gouge, MD [address] [place],  [place] [address]  No chief complaint on file.   HPI: 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  He was managing his retention with CIC, but it had become more difficult for him and therefore a SPT was placed.  His most recent placement was on 03/16/2019 by IR with a 16 F catheter.    He presented to the ED on 03/18/2019 with the complaint of fevers and hematuria.  Contrast CT noted suprapubic catheter coiled within the urinary bladder which is decompressed. Mild inflammatory or infectious thickening along the catheter course. No evidence of abscess formation.  Stable chronic left hydronephrosis and hydroureter to the level of the distal 2/3 of the left ureter.  Distended gallbladder with a sliver of pericholecystic fluid.  Please correlate clinically to exclude acalculous cholecystitis.  Multiloculated area of hypoattenuation in the pancreatic body measuring 4.7 cm in greatest dimension, grossly stable from the CT dated 11/30/2017. 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  Evaluation with MRI of the abdomen, when clinically feasible may be considered.   Labs in the ED:  UA was nitrite positive, > 50 RBC's and WBC's, urine culture was positive for multiple organisms, WBC count was 8.5 and creatinine 0.78.     Meds given in the ED: Carole Civil, Maxipime,   Prior urological history:  PCNL in 2010 and ureteroscopic stone removal 2018.  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  He has completed the antibiotic.  Patient denies any gross hematuria, dysuria or suprapubic/flank pain.  Patient denies any fevers, chills, nausea or vomiting.  His urine is  clear.  His main complaint is a burning sensation at the SPT insertion site.  PMH: Past Medical History:  Diagnosis Date  . Arthritis   . CHF (congestive heart failure) ([place])   . Chronic kidney disease    chronic kidney disease  . Colon cancer ([place]) 2003   Hx Partial colon resection, chemo + rad tx's.  Marland Kitchen COPD (chronic obstructive pulmonary disease) ([place])   . Coronary artery disease   . Diabetes mellitus without complication ([place])   . Dysrhythmia    atrial fib., bradycardia  . GERD (gastroesophageal reflux disease)   . Hypertension   . Hypothyroidism   . Long term (current) use of anticoagulants   . Mixed hearing loss, unilateral    03/07/14  . Myocardial infarction ([place])   . Presence of permanent cardiac pacemaker    [employer] Accolade DDDR [phone]  . Pseudophakia of both eyes   . Sick sinus syndrome ([place]) 04/26/2014  . Stroke [facility])     Surgical History: Past Surgical History:  Procedure Laterality Date  . Cardioversion External    . Cataract cortical ssenile    . COLON SURGERY    . COLON SURGERY    . COLONOSCOPY WITH PROPOFOL N/A 01/27/2018   Procedure: COLONOSCOPY WITH PROPOFOL;  Surgeon: Lollie Sails, MD;  Location: [facility] END[facility]COPY;  Service: Endoscopy;  Laterality: N/A;  . CORONARY ANGIOPLASTY    . Insertion dual chamber pacemaker generator    . IR CATHETER TUBE CHANGE  03/16/2019    Home Medications:  Allergies as of 03/26/2019  Reactions   Vancomycin Swelling   Facial swelling   Dabigatran Other (See Comments)   Bleeding complications Bleeding complications   Pollen Extract    Pradaxa [dabigatran Etexilate Mesylate]       Medication List       Accurate as of March 26, 2019  4:27 PM. If you have any questions, ask your nurse or doctor.        acetaminophen 325 MG tablet Commonly known as: TYLENOL Take 650 mg by mouth every 6 (six) hours as needed for mild pain or moderate pain.   albuterol 108 (90 Base) MCG/ACT inhaler Commonly known  as: VENTOLIN HFA Inhale 2 puffs into the lungs every 6 (six) hours as needed.   aspirin 81 MG chewable tablet Chew 81 mg by mouth every morning.   atorvastatin 40 MG tablet Commonly known as: LIPITOR TK 1 T PO D   Calcium Carb-Ergocalciferol 500-200 MG-UNIT Tabs Take by mouth.   cefdinir 300 MG capsule Commonly known as: OMNICEF Take 1 capsule (300 mg total) by mouth 2 (two) times daily.   citalopram 20 MG tablet Commonly known as: CELEXA Take 20 mg by mouth daily.   doxycycline 100 MG capsule Commonly known as: VIBRAMYCIN Take 1 capsule (100 mg total) by mouth 2 (two) times daily.   famotidine 20 MG tablet Commonly known as: PEPCID Take 20 mg by mouth 2 (two) times daily.   Fifty50 Pen Needles 31G X 8 MM Misc Generic drug: Insulin Pen Needle Use as directed   glipiZIDE 5 MG tablet Commonly known as: GLUCOTROL Take 5 mg by mouth 2 (two) times daily.   insulin aspart 100 UNIT/ML FlexPen Commonly known as: NOVOLOG Take Insulin as directed by sliding scale   ketotifen 0.025 % ophthalmic solution Commonly known as: ZADITOR INT 1 GTT IN OU BID FOR ALLERGIES OR ITCHING   KP Fish Oil 1200 MG Caps Take by mouth.   lactulose 20 g packet Commonly known as: CEPHULAC Take 1 packet (20 g total) by mouth daily as needed (constipation).   NEOMYCIN-POLYMYXIN-HYDROCORTISONE 1 % Soln OTIC solution Commonly known as: CORTISPORIN   nitroGLYCERIN 0.4 MG SL tablet Commonly known as: NITROSTAT Place 1 tablet (0.4 mg total) under the tongue every 5 (five) minutes as needed for chest pain.   sertraline 25 MG tablet Commonly known as: ZOLOFT Take by mouth.   sulfamethoxazole-trimethoprim 800-160 MG tablet Commonly known as: BACTRIM DS Take 1 tablet by mouth every 12 (twelve) hours.   tamsulosin 0.4 MG Caps capsule Commonly known as: FLOMAX Take 1 capsule (0.4 mg total) by mouth daily.   tiZANidine 2 MG tablet Commonly known as: ZANAFLEX   traMADol 50 MG tablet  Commonly known as: ULTRAM Take 1 tablet (50 mg total) by mouth every 6 (six) hours as needed.   traZODone 50 MG tablet Commonly known as: DESYREL Take by mouth.   VITAMIN B COMPLEX PO Take 2 tablets by mouth daily.   vitamin B-12 1000 MCG tablet Commonly known as: CYANOCOBALAMIN Take 1,000 mcg by mouth daily.   warfarin 3 MG tablet Commonly known as: COUMADIN Take 2.5 mg by mouth daily at 6 PM. Take 3MG  by mouth daily except on Friday take 5MG  by mouth       Allergies:  Allergies  Allergen Reactions  . Vancomycin Swelling    Facial swelling  . Dabigatran Other (See Comments)    Bleeding complications Bleeding complications   . Pollen Extract   . Pradaxa [Dabigatran Etexilate Mesylate]  Family History: Family History  Problem Relation Age of Onset  . Cancer Father     Social History:  reports that he has quit smoking. His smoking use included cigarettes. He has a 60.00 pack-year smoking history. He has never used smokeless tobacco. He reports that he does not drink alcohol or use drugs.  ROS: UROLOGY Frequent Urination?: No Hard to postpone urination?: No Burning/pain with urination?: Yes Get up at night to urinate?: No Leakage of urine?: No Urine stream starts and stops?: No Trouble starting stream?: No Do you have to strain to urinate?: Yes Blood in urine?: Yes Urinary tract infection?: No Sexually transmitted disease?: No Injury to kidneys or bladder?: No Painful intercourse?: No Weak stream?: No Erection problems?: No Penile pain?: No  Gastrointestinal Nausea?: No Vomiting?: No Indigestion/heartburn?: No Diarrhea?: No Constipation?: No  Constitutional Fever: No Night sweats?: No Weight loss?: No Fatigue?: No  Skin Skin rash/lesions?: No Itching?: No  Eyes Blurred vision?: No Double vision?: No  Ears/Nose/Throat Sore throat?: No Sinus problems?: No  Hematologic/Lymphatic Swollen glands?: No Easy bruising?: No   Cardiovascular Leg swelling?: No Chest pain?: No  Respiratory Cough?: No Shortness of breath?: Yes  Endocrine Excessive thirst?: No  Musculoskeletal Back pain?: No Joint pain?: No  Neurological Headaches?: No Dizziness?: No  Psychologic Depression?: No Anxiety?: No  Physical Exam: BP (!) 166/68   Pulse 60   Ht 5\' 10"  (1.778 m)   BMI 27.26 kg/m   Constitutional:  Well nourished. Alert and oriented, No acute distress. HEENT: [place] AT, moist mucus membranes.  Trachea midline, no masses. Cardiovascular: No clubbing, cyanosis, or edema. Respiratory: Normal respiratory effort, no increased work of breathing. GI: Abdomen is soft, non tender, non distended, no abdominal masses.  SPT site clean and dry.  SPT found on tension. Neurologic: Non ambulatory in a wheel chair.   Psychiatric: Normal mood and affect.  Laboratory Data: Lab Results  Component Value Date   WBC 8.5 03/18/2019   HGB 13.9 03/18/2019   HCT 42.4 03/18/2019   MCV 87.6 03/18/2019   PLT 204 03/18/2019    Lab Results  Component Value Date   CREATININE 0.78 03/18/2019    No results found for: PSA  No results found for: TESTOSTERONE  No results found for: HGBA1C  No results found for: TSH  No results found for: CHOL, HDL, CHOLHDL, VLDL, LDLCALC  Lab Results  Component Value Date   AST 16 03/18/2019   Lab Results  Component Value Date   ALT 16 03/18/2019   No components found for: ALKALINEPHOPHATASE No components found for: BILIRUBINTOTAL  No results found for: ESTRADIOL  Urinalysis    Component Value Date/Time   COLORURINE AMBER (A) 03/18/2019 1302   APPEARANCEUR CLOUDY (A) 03/18/2019 1302   APPEARANCEUR Clear 02/08/2019 1414   LABSPEC 1.028 03/18/2019 1302   PHURINE 5.0 03/18/2019 1302   GLUCOSEU 50 (A) 03/18/2019 1302   HGBUR LARGE (A) 03/18/2019 1302   BILIRUBINUR NEGATIVE 03/18/2019 1302   BILIRUBINUR Negative 02/08/2019 1414   KETONESUR NEGATIVE 03/18/2019 1302   PROTEINUR  100 (A) 03/18/2019 1302   NITRITE POSITIVE (A) 03/18/2019 1302   LEUKOCYTESUR MODERATE (A) 03/18/2019 1302    I have reviewed the labs.   Assessment & Plan:    1. Urinary retention Managed by SPT - will need an appointment the week of July 19th for SPT exchanged Readjusted the SPT so as to relieve tension.  Explained to Mrs. Kessen that she needs to keep slack in the  line so that the balloon of the SPT is pressed against the site.    2. History of hematuria Hematuria work up completed in 01/2018 - findings positive for severe chronic hydronephrosis of the LEFT kidney with severe cortical thinning severe and impaired renal function.  Tiny 1 mm nonobstructing RIGHT renal calculus. No ureterolithiasis or obstructive uropathy.  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  No bladder stones or filling defects in the bladder which does not excluded a bladder lesion. Recent gross hematuria likely due to infection and SPT trauma Will continue to monitor  Return for Keep follow up in July .  These notes generated with voice recognition software. I apologize for typographical errors.  Zara Council, PA-C  [facility] [address]  [place], [place] [address] [phone]

## 2019-03-26 ENCOUNTER — Encounter: Payer: Self-pay | Admitting: Urology

## 2019-03-26 ENCOUNTER — Ambulatory Visit (INDEPENDENT_AMBULATORY_CARE_PROVIDER_SITE_OTHER): Payer: Medicare Other | Admitting: Urology

## 2019-03-26 ENCOUNTER — Other Ambulatory Visit: Payer: Self-pay

## 2019-03-26 VITALS — BP 166/68 | HR 60 | Ht 70.0 in

## 2019-03-26 DIAGNOSIS — R339 Retention of urine, unspecified: Secondary | ICD-10-CM

## 2019-03-26 DIAGNOSIS — Z87448 Personal history of other diseases of urinary system: Secondary | ICD-10-CM

## 2019-03-29 ENCOUNTER — Encounter: Payer: Self-pay | Admitting: Urology

## 2019-03-29 ENCOUNTER — Ambulatory Visit (INDEPENDENT_AMBULATORY_CARE_PROVIDER_SITE_OTHER): Payer: Medicare Other | Admitting: Urology

## 2019-03-29 ENCOUNTER — Other Ambulatory Visit: Payer: Self-pay

## 2019-03-29 ENCOUNTER — Telehealth: Payer: Self-pay | Admitting: Urology

## 2019-03-29 VITALS — BP 133/76 | HR 59 | Ht 70.0 in

## 2019-03-29 DIAGNOSIS — R339 Retention of urine, unspecified: Secondary | ICD-10-CM | POA: Diagnosis not present

## 2019-03-29 DIAGNOSIS — N3001 Acute cystitis with hematuria: Secondary | ICD-10-CM | POA: Diagnosis not present

## 2019-03-29 DIAGNOSIS — Z87448 Personal history of other diseases of urinary system: Secondary | ICD-10-CM | POA: Diagnosis not present

## 2019-03-29 LAB — MICROSCOPIC EXAMINATION
RBC, Urine: >30 /hpf — AB (ref 0–2)
WBC, UA: >30 /hpf — AB (ref 0–5)

## 2019-03-29 LAB — URINALYSIS, COMPLETE
Bilirubin, UA: NEGATIVE
Glucose, UA: NEGATIVE
Nitrite, UA: POSITIVE — AB
Specific Gravity, UA: 1.025 (ref 1.005–1.030)
Urobilinogen, Ur: 1 mg/dL (ref 0.2–1.0)
pH, UA: 5 (ref 5.0–7.5)

## 2019-03-29 MED ORDER — CIPROFLOXACIN HCL 250 MG PO TABS: 250.0000 mg | ORAL_TABLET | Freq: Two times a day (BID) | ORAL | 0 refills | Status: DC

## 2019-03-29 NOTE — Telephone Encounter (Signed)
As it is a long weekend coming ahead, she should probably bring him in today so that we can check him over.

## 2019-03-29 NOTE — Telephone Encounter (Signed)
Pt. Having stomach pains through the night and blood appearing in tube of SPT. Please advise

## 2019-03-29 NOTE — Telephone Encounter (Signed)
Patient has been scheduled

## 2019-04-02 ENCOUNTER — Telehealth: Payer: Self-pay

## 2019-04-02 NOTE — Telephone Encounter (Signed)
-----   Message from Nori Riis, PA-C sent at 04/02/2019  2:02 PM EDT ----- Please let Mrs. Faulkenberry know her husband's urine culture was positive for an infection.  He needs to continue the Cipro and keep their follow up appointment.

## 2019-04-02 NOTE — Telephone Encounter (Signed)
Patients wife notified

## 2019-04-03 ENCOUNTER — Other Ambulatory Visit: Payer: Self-pay | Admitting: Radiology

## 2019-04-03 DIAGNOSIS — R339 Retention of urine, unspecified: Secondary | ICD-10-CM

## 2019-04-03 LAB — CULTURE, URINE COMPREHENSIVE

## 2019-04-05 ENCOUNTER — Telehealth: Payer: Self-pay | Admitting: Primary Care

## 2019-04-05 NOTE — Telephone Encounter (Signed)
Spoke with patient's wife [name] and have scheduled a Telephone Palliative Consult for 04/06/19 @ 3 PM.  Verbal consent received from wife for Palliative services

## 2019-04-06 ENCOUNTER — Other Ambulatory Visit: Payer: Medicare Other | Admitting: Primary Care

## 2019-04-06 ENCOUNTER — Other Ambulatory Visit: Payer: Self-pay

## 2019-04-06 DIAGNOSIS — Z515 Encounter for palliative care: Secondary | ICD-10-CM
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
## 2019-04-10 ENCOUNTER — Telehealth: Payer: Self-pay

## 2019-04-10 ENCOUNTER — Other Ambulatory Visit
Admission: RE | Admit: 2019-04-10 | Discharge: 2019-04-10 | Disposition: A | Discharge status: Home / Self Care | Payer: No Typology Code available for payment source | Source: Ambulatory Visit | Attending: Urology | Admitting: Urology

## 2019-04-10 ENCOUNTER — Telehealth: Payer: Self-pay | Admitting: Primary Care

## 2019-04-10 NOTE — Telephone Encounter (Signed)
Niece Narda Rutherford called to say daughter had contacted her and reported patient is declining and they would like a follow up visit his week. Notified palliative Admin to contact daughter Jarome Matin at [phone] to schedule visit with NP

## 2019-04-10 NOTE — Telephone Encounter (Signed)
Called patient's daughter Estill Bamberg to schedule f/u visit, no answer.  Left message with reason for call along with my name and contact number

## 2019-04-11 ENCOUNTER — Other Ambulatory Visit: Payer: Self-pay

## 2019-04-11 ENCOUNTER — Other Ambulatory Visit: Payer: Medicare Other | Admitting: Primary Care

## 2019-04-11 ENCOUNTER — Telehealth: Payer: Self-pay | Admitting: Primary Care

## 2019-04-11 DIAGNOSIS — Z515 Encounter for palliative care: Secondary | ICD-10-CM

## 2019-04-11 NOTE — Telephone Encounter (Signed)
T/c to family to f/u after call from daughter yesterday. Patient wife states she had not called but thought NP was going to return call. Clarified f/u was TBD. Wife states she'd like an appointment today, made at 4 pm.
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
## 2019-04-13 ENCOUNTER — Ambulatory Visit
Admission: RE | Admit: 2019-04-13 | Discharge: 2019-04-13 | Disposition: A | Discharge status: Home / Self Care | Payer: Medicare Other | Source: Ambulatory Visit | Attending: Urology | Admitting: Urology

## 2019-04-13 ENCOUNTER — Encounter: Payer: Self-pay | Admitting: Interventional Radiology

## 2019-04-13 ENCOUNTER — Other Ambulatory Visit: Payer: Self-pay

## 2019-04-13 ENCOUNTER — Inpatient Hospital Stay: Payer: Medicare Other | Admitting: Oncology

## 2019-04-13 DIAGNOSIS — R339 Retention of urine, unspecified: Secondary | ICD-10-CM | POA: Insufficient documentation

## 2019-04-13 DIAGNOSIS — Z436 Encounter for attention to other artificial openings of urinary tract: Secondary | ICD-10-CM | POA: Insufficient documentation

## 2019-04-13 HISTORY — PX: IR CATHETER TUBE CHANGE: IMG717

## 2019-04-13 MED ORDER — IOHEXOL 300 MG/ML  SOLN: 50.0000 mL | Freq: Once | INTRAMUSCULAR | Status: DC | PRN

## 2019-04-13 MED ORDER — LIDOCAINE-EPINEPHRINE (PF) 1 %-1:200000 IJ SOLN
INTRAMUSCULAR | Status: AC
  Filled 2019-04-13: qty 10

## 2019-04-13 NOTE — Procedures (Signed)
Pre procedural Dx: Urinary retention Post procedural Dx: Same  Technically successful fluoro guided exchange of existing suprapubic catheter to a 16 Fr Council Catheter.  EBL: None Complications: None immediate  Ronny Bacon, MD Pager #: [phone]

## 2019-04-16 NOTE — Progress Notes (Signed)
03/29/2019 12:57 PM   Michael Bush 25-May-1941 [id number]  Referring provider: Clarisse Gouge, MD [address] [place],  [place] [address]  Chief Complaint  Patient presents with  . Urinary Retention  . 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   He was managing his retention with CIC, but it had become more difficult for him and therefore a SPT was placed.  He presented to the ED on 03/18/2019 with the complaint of fevers and hematuria.  Contrast CT noted suprapubic catheter coiled within the urinary bladder which is decompressed. Mild inflammatory or infectious thickening along the catheter course. No evidence of abscess formation.  Stable chronic left hydronephrosis and hydroureter to the level of the distal 2/3 of the left ureter.  Distended gallbladder with a sliver of pericholecystic fluid.  Please correlate clinically to exclude acalculous cholecystitis.  Multiloculated area of hypoattenuation in the pancreatic body measuring 4.7 cm in greatest dimension, grossly stable from the CT dated 11/30/2017. 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  Evaluation with MRI of the abdomen, when clinically feasible may be considered.  Labs in the ED:  UA was nitrite positive, > 50 RBC's and WBC's, urine culture was positive for multiple organisms, WBC count was 8.5 and creatinine 0.78.  Meds given in the ED: Carole Civil, Maxipime,   Prior urological history:  PCNL in 2010 and ureteroscopic stone removal 2018.  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  It was noted that his SPT was on tension.    Today, he is experiencing burning at the SPT site and gross hematuria.   Upon presenting to the office, the urine appears amber in the leg bag.  Patient denies any flank pain.  Patient denies any fevers, chills, nausea or vomiting.   His UA obtained after plugging the SPT revealed > 30 WBC's, > 30 RBC's, many bacteria and was nitrite positive.     PMH: Past Medical History:  Diagnosis Date  . Arthritis   . CHF (congestive heart failure) ([place])   . Chronic kidney disease    chronic kidney disease  . Colon cancer ([place]) 2003   Hx Partial colon resection, chemo + rad tx's.  Marland Kitchen COPD (chronic obstructive pulmonary disease) ([place])   . Coronary artery disease   . Diabetes mellitus without complication ([place])   . Dysrhythmia    atrial fib., bradycardia  . GERD (gastroesophageal reflux disease)   . Hypertension   . Hypothyroidism   . Long term (current) use of anticoagulants   . Mixed hearing loss, unilateral    03/07/14  . Myocardial infarction ([place])   . Presence of permanent cardiac pacemaker    [employer] Accolade DDDR [phone]  . Pseudophakia of both eyes   . Sick sinus syndrome ([place]) 04/26/2014  . Stroke [facility])     Surgical History: Past Surgical History:  Procedure Laterality Date  . Cardioversion External    . Cataract cortical ssenile    . COLON SURGERY    . COLON SURGERY    . COLONOSCOPY WITH PROPOFOL N/A 01/27/2018   Procedure: COLONOSCOPY WITH PROPOFOL;  Surgeon: Lollie Sails, MD;  Location: [facility] ENDOSCOPY;  Service: Endoscopy;  Laterality: N/A;  . CORONARY ANGIOPLASTY    . Insertion dual chamber pacemaker generator    . IR CATHETER TUBE CHANGE  03/16/2019  . IR CATHETER TUBE CHANGE  04/13/2019    Home Medications:  Allergies as of 03/29/2019      Reactions   Vancomycin Swelling   Facial swelling   Dabigatran Other (See Comments)   Bleeding complications Bleeding complications   Pollen Extract    Pradaxa [dabigatran Etexilate Mesylate]       Medication List       Accurate as of March 29, 2019 11:59 PM. If you have any questions,  ask your nurse or doctor.        acetaminophen 325 MG tablet Commonly known as: TYLENOL Take 650 mg by mouth every 6 (six) hours as needed for mild pain or moderate pain.   albuterol 108 (90 Base) MCG/ACT inhaler Commonly known as: VENTOLIN HFA Inhale 2 puffs into the lungs every 6 (six) hours as needed.   aspirin 81 MG chewable tablet Chew 81 mg by mouth every morning.   atorvastatin 40 MG tablet Commonly known as: LIPITOR TK 1 T PO D   Calcium Carb-Ergocalciferol 500-200 MG-UNIT Tabs Take by mouth.   cefdinir 300 MG capsule Commonly known as: OMNICEF Take 1 capsule (300 mg total) by mouth 2 (two) times daily.   ciprofloxacin 250 MG tablet Commonly known as: Cipro Take 1 tablet (250 mg total) by mouth 2 (two) times daily. Started by: Zara Council, PA-C   citalopram 20 MG tablet Commonly known as: CELEXA Take 20 mg by mouth daily.   doxycycline 100 MG capsule Commonly known as: VIBRAMYCIN Take 1 capsule (100 mg total) by mouth 2 (two) times daily.   famotidine 20 MG tablet Commonly known as: PEPCID Take 20 mg by mouth 2 (two) times daily.   Fifty50 Pen Needles 31G X 8 MM Misc Generic drug: Insulin Pen Needle Use as directed   glipiZIDE 5 MG tablet Commonly known as: GLUCOTROL Take 5 mg by mouth 2 (two) times daily.   insulin aspart 100 UNIT/ML FlexPen Commonly known as: NOVOLOG Take Insulin as directed by sliding scale   ketotifen 0.025 % ophthalmic solution Commonly known as: ZADITOR INT 1 GTT IN OU BID FOR ALLERGIES OR ITCHING   KP Fish Oil 1200 MG Caps Take by mouth.   lactulose 20 g packet Commonly known as: CEPHULAC Take 1 packet (20 g total) by mouth daily as needed (constipation).   NEOMYCIN-POLYMYXIN-HYDROCORTISONE 1 % Soln OTIC solution Commonly known as: CORTISPORIN   nitroGLYCERIN 0.4 MG SL tablet Commonly known as: NITROSTAT Place 1 tablet (0.4 mg total) under the tongue every 5 (five) minutes as needed for chest pain.    sertraline 25 MG tablet Commonly known as: ZOLOFT Take by mouth.   sulfamethoxazole-trimethoprim 800-160 MG tablet Commonly known as: BACTRIM DS Take 1 tablet by mouth every 12 (twelve) hours.   tamsulosin 0.4 MG Caps capsule Commonly known as: FLOMAX Take 1 capsule (0.4 mg total) by mouth daily.   tiZANidine 2 MG tablet Commonly known as: ZANAFLEX   traMADol 50 MG tablet Commonly known as: ULTRAM Take 1 tablet (50 mg total) by mouth every 6 (six) hours as needed.   traZODone 50 MG tablet Commonly known as: DESYREL Take by mouth.   VITAMIN B COMPLEX PO Take 2 tablets by mouth daily.   vitamin B-12 1000 MCG tablet Commonly known as: CYANOCOBALAMIN Take 1,000 mcg by mouth daily.  warfarin 3 MG tablet Commonly known as: COUMADIN Take 2.5 mg by mouth daily at 6 PM. Take 3MG  by mouth daily except on Friday take 5MG  by mouth       Allergies:  Allergies  Allergen Reactions  . Vancomycin Swelling    Facial swelling  . Dabigatran Other (See Comments)    Bleeding complications Bleeding complications   . Pollen Extract   . Pradaxa [Dabigatran Etexilate Mesylate]     Family History: Family History  Problem Relation Age of Onset  . Cancer Father     Social History:  reports that he has quit smoking. His smoking use included cigarettes. He has a 60.00 pack-year smoking history. He has never used smokeless tobacco. He reports that he does not drink alcohol or use drugs.  ROS: UROLOGY Frequent Urination?: No Hard to postpone urination?: No Burning/pain with urination?: Yes Get up at night to urinate?: No Leakage of urine?: No Urine stream starts and stops?: No Trouble starting stream?: No Do you have to strain to urinate?: No Blood in urine?: Yes Urinary tract infection?: Yes Sexually transmitted disease?: No Injury to kidneys or bladder?: No Painful intercourse?: No Weak stream?: No Erection problems?: No Penile pain?: No  Gastrointestinal Nausea?: No  Vomiting?: No Indigestion/heartburn?: No Diarrhea?: No Constipation?: Yes  Constitutional Fever: No Night sweats?: No Weight loss?: No Fatigue?: Yes  Skin Skin rash/lesions?: No Itching?: Yes  Eyes Blurred vision?: Yes Double vision?: No  Ears/Nose/Throat Sore throat?: No Sinus problems?: No  Hematologic/Lymphatic Swollen glands?: No Easy bruising?: Yes  Cardiovascular Leg swelling?: No Chest pain?: No  Respiratory Cough?: No Shortness of breath?: Yes  Endocrine Excessive thirst?: No  Musculoskeletal Back pain?: Yes Joint pain?: Yes  Neurological Headaches?: Yes Dizziness?: No  Psychologic Depression?: Yes Anxiety?: Yes  Physical Exam: BP 133/76 (BP Location: Left Arm, Patient Position: Sitting, Cuff Size: Normal)   Pulse (!) 59   Ht 5\' 10"  (1.778 m)   BMI 27.26 kg/m   Constitutional:  Well nourished. Alert and oriented, No acute distress. HEENT:  AT, moist mucus membranes.  Trachea midline, no masses. Cardiovascular: No clubbing, cyanosis, or edema. Respiratory: Normal respiratory effort, no increased work of breathing. GI: Abdomen is soft, non tender, non distended, no abdominal masses. SPT site clean and dry.   Neurologic: Grossly intact, no focal deficits, moving all 4 extremities. Psychiatric: Normal mood and affect.  Laboratory Data: Lab Results  Component Value Date   WBC 8.5 03/18/2019   HGB 13.9 03/18/2019   HCT 42.4 03/18/2019   MCV 87.6 03/18/2019   PLT 204 03/18/2019    Lab Results  Component Value Date   CREATININE 0.78 03/18/2019    No results found for: PSA  No results found for: TESTOSTERONE  No results found for: HGBA1C  No results found for: TSH  No results found for: CHOL, HDL, CHOLHDL, VLDL, LDLCALC  Lab Results  Component Value Date   AST 16 03/18/2019   Lab Results  Component Value Date   ALT 16 03/18/2019   No components found for: ALKALINEPHOPHATASE No components found for: BILIRUBINTOTAL   No results found for: ESTRADIOL  Urinalysis Nitrite positive, > 30 WBC's, > 30 RBC's and many bacteria.  See Epic. I have reviewed the labs.   Assessment & Plan:    1. Urinary retention Managed by SPT - will need an appointment the week of July 19th for SPT exchanged  2. History of hematuria Hematuria work up completed in 01/2018 - findings positive for  severe chronic hydronephrosis of the LEFT kidney with severe cortical thinning severe and impaired renal function.  Tiny 1 mm nonobstructing RIGHT renal calculus. No ureterolithiasis or obstructive uropathy.  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  No bladder stones or filling defects in the bladder which does not excluded a bladder lesion. Recent gross hematuria likely due to infection and SPT trauma Will continue to monitor  3. UTI UA is suspicious for infection - will send for culture as patient is symptomatic - will start Cipro 250 mg and will adjust if necessary once culture and sensitivities are available   Return for schedule next SPT exchange and dilation and office visit after .  These notes generated with voice recognition software. I apologize for typographical errors.  Zara Council, PA-C  [facility] [address]  [place], [place] [address] [phone]

## 2019-04-18 ENCOUNTER — Telehealth: Payer: Self-pay | Admitting: Urology

## 2019-04-18 NOTE — Telephone Encounter (Signed)
Pt's wife LMOM and states that he will not be able to come into the office tomorrow for his Appt. He fell and is hurting, she states that you can do a virtual call if needed. Please advise.

## 2019-04-18 NOTE — Telephone Encounter (Signed)
Michael Bush had his current SPT placed by IR on 04/13/2019, so he can be rescheduled to the week of 05/14/2019.

## 2019-04-18 NOTE — Progress Notes (Deleted)
04/19/2019 11:55 AM   Michael Bush 1941-03-21 [id number]  Referring provider: Clarisse Gouge, MD [address] [place],  [place] [address]  No chief complaint on file.   HPI: 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   He was managing his retention with CIC, but it had become more difficult for him and therefore a SPT was placed.  He presented to the ED on 03/18/2019 with the complaint of fevers and hematuria.  Contrast CT noted suprapubic catheter coiled within the urinary bladder which is decompressed. Mild inflammatory or infectious thickening along the catheter course. No evidence of abscess formation.  Stable chronic left hydronephrosis and hydroureter to the level of the distal 2/3 of the left ureter.  Distended gallbladder with a sliver of pericholecystic fluid.  Please correlate clinically to exclude acalculous cholecystitis.  Multiloculated area of hypoattenuation in the pancreatic body measuring 4.7 cm in greatest dimension, grossly stable from the CT dated 11/30/2017. 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  Evaluation with MRI of the abdomen, when clinically feasible may be considered.  Labs in the ED:  UA was nitrite positive, > 50 RBC's and WBC's, urine culture was positive for multiple organisms, WBC count was 8.5 and creatinine 0.78.  Meds given in the ED: Omnicef and Maxipime.     Prior urological history:  PCNL in 2010 and ureteroscopic stone removal 2018.  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  It was noted that his SPT was on tension.    At his visit on 03/29/2019, his urine was suspicious for infection and it was sent for culture.  It grew out Citrobacter  and Pseudomonas.   He was placed on Cipro.    On 04/13/2019, he was up sized to a 63f council catheter.    PMH: Past Medical History:  Diagnosis Date  . Arthritis   . CHF (congestive heart failure) ([facility])   . Chronic kidney disease    chronic kidney disease  . Colon cancer ([facility]) 2003   Hx Partial colon resection, chemo + rad tx's.  Marland Kitchen COPD (chronic obstructive pulmonary disease) ([facility])   . Coronary artery disease   . Diabetes mellitus without complication ([facility])   . Dysrhythmia    atrial fib., bradycardia  . GERD (gastroesophageal reflux disease)   . Hypertension   . Hypothyroidism   . Long term (current) use of anticoagulants   . Mixed hearing loss, unilateral    03/07/14  . Myocardial infarction ([facility])   . Presence of permanent cardiac pacemaker    [employer] Accolade DDDR [phone]  . Pseudophakia of both eyes   . Sick sinus syndrome ([facility]) 04/26/2014  . Stroke [facility])     Surgical History: Past Surgical History:  Procedure Laterality Date  . Cardioversion External    . Cataract cortical ssenile    . COLON SURGERY    . COLON SURGERY    . COLONOSCOPY WITH PROPOFOL N/A 01/27/2018   Procedure: COLONOSCOPY WITH PROPOFOL;  Surgeon: Lollie Sails, MD;  Location: [facility] ENDOSCOPY;  Service: Endoscopy;  Laterality: N/A;  . CORONARY ANGIOPLASTY    . Insertion dual chamber pacemaker generator    . IR CATHETER TUBE CHANGE  03/16/2019  . IR CATHETER TUBE CHANGE  04/13/2019    Home Medications:  Allergies as of 04/19/2019      Reactions   Vancomycin Swelling   Facial swelling   Dabigatran Other (See Comments)   Bleeding complications Bleeding complications   Pollen Extract    Pradaxa [dabigatran Etexilate Mesylate]       Medication List       Accurate as of April 18, 2019 11:55 AM. If you have any questions, ask your nurse or doctor.        acetaminophen 325 MG tablet Commonly known as: TYLENOL Take 650 mg by mouth every 6 (six) hours as needed for mild pain or  moderate pain.   albuterol 108 (90 Base) MCG/ACT inhaler Commonly known as: VENTOLIN HFA Inhale 2 puffs into the lungs every 6 (six) hours as needed.   aspirin 81 MG chewable tablet Chew 81 mg by mouth every morning.   atorvastatin 40 MG tablet Commonly known as: LIPITOR TK 1 T PO D   Calcium Carb-Ergocalciferol 500-200 MG-UNIT Tabs Take by mouth.   cefdinir 300 MG capsule Commonly known as: OMNICEF Take 1 capsule (300 mg total) by mouth 2 (two) times daily.   ciprofloxacin 250 MG tablet Commonly known as: Cipro Take 1 tablet (250 mg total) by mouth 2 (two) times daily.   citalopram 20 MG tablet Commonly known as: CELEXA Take 20 mg by mouth daily.   doxycycline 100 MG capsule Commonly known as: VIBRAMYCIN Take 1 capsule (100 mg total) by mouth 2 (two) times daily.   famotidine 20 MG tablet Commonly known as: PEPCID Take 20 mg by mouth 2 (two) times daily.   Fifty50 Pen Needles 31G X 8 MM Misc Generic drug: Insulin Pen Needle Use as directed   glipiZIDE 5 MG tablet Commonly known as: GLUCOTROL Take 5 mg by mouth 2 (two) times daily.   insulin aspart 100 UNIT/ML FlexPen Commonly known as: NOVOLOG Take Insulin as directed by sliding scale   ketotifen 0.025 % ophthalmic solution Commonly known as: ZADITOR INT 1 GTT IN OU BID FOR ALLERGIES OR ITCHING   KP Fish Oil 1200 MG Caps Take by mouth.   lactulose 20 g packet Commonly known as: CEPHULAC Take 1 packet (20 g total) by mouth daily as needed (constipation).   NEOMYCIN-POLYMYXIN-HYDROCORTISONE 1 % Soln OTIC solution Commonly known as: CORTISPORIN   nitroGLYCERIN 0.4 MG SL tablet Commonly known as: NITROSTAT Place 1 tablet (0.4 mg total) under the tongue every 5 (five) minutes as needed for chest pain.   sertraline 25 MG tablet Commonly known as: ZOLOFT Take by mouth.   sulfamethoxazole-trimethoprim 800-160 MG tablet Commonly known as: BACTRIM DS Take 1 tablet by mouth every 12 (twelve) hours.    tamsulosin 0.4 MG Caps capsule Commonly known as: FLOMAX Take 1 capsule (0.4 mg total) by mouth daily.   tiZANidine 2 MG tablet Commonly known as: ZANAFLEX   traMADol 50 MG tablet Commonly known as: ULTRAM Take 1 tablet (50 mg total) by mouth every 6 (six) hours as needed.   traZODone 50 MG tablet Commonly known as: DESYREL Take by mouth.   VITAMIN B COMPLEX PO Take 2 tablets by mouth daily.   vitamin B-12 1000 MCG tablet Commonly known as: CYANOCOBALAMIN Take 1,000 mcg by mouth daily.   warfarin 3 MG tablet Commonly known as: COUMADIN Take 2.5 mg by mouth daily at 6 PM. Take 3MG  by mouth daily except on Friday take 5MG  by mouth  Allergies:  Allergies  Allergen Reactions  . Vancomycin Swelling    Facial swelling  . Dabigatran Other (See Comments)    Bleeding complications Bleeding complications   . Pollen Extract   . Pradaxa [Dabigatran Etexilate Mesylate]     Family History: Family History  Problem Relation Age of Onset  . Cancer Father     Social History:  reports that he has quit smoking. His smoking use included cigarettes. He has a 60.00 pack-year smoking history. He has never used smokeless tobacco. He reports that he does not drink alcohol or use drugs.  ROS:                                        Physical Exam: There were no vitals taken for this visit.  Constitutional:  Well nourished. Alert and oriented, No acute distress. HEENT: [place] AT, moist mucus membranes.  Trachea midline, no masses. Cardiovascular: No clubbing, cyanosis, or edema. Respiratory: Normal respiratory effort, no increased work of breathing. GI: Abdomen is soft, non tender, non distended, no abdominal masses. Liver and spleen not palpable.  No hernias appreciated.  Stool sample for occult testing is not indicated.   GU: No CVA tenderness.  No bladder fullness or masses.  Patient with circumcised/uncircumcised phallus. ***Foreskin easily retracted***   Urethral meatus is patent.  No penile discharge. No penile lesions or rashes. Scrotum without lesions, cysts, rashes and/or edema.  Testicles are located scrotally bilaterally. No masses are appreciated in the testicles. Left and right epididymis are normal. Rectal: Patient with  normal sphincter tone. Anus and perineum without scarring or rashes. No rectal masses are appreciated. Prostate is approximately *** grams, *** nodules are appreciated. Seminal vesicles are normal. Skin: No rashes, bruises or suspicious lesions. Lymph: No cervical or inguinal adenopathy. Neurologic: Grossly intact, no focal deficits, moving all 4 extremities. Psychiatric: Normal mood and affect.  Laboratory Data: Lab Results  Component Value Date   WBC 8.5 03/18/2019   HGB 13.9 03/18/2019   HCT 42.4 03/18/2019   MCV 87.6 03/18/2019   PLT 204 03/18/2019    Lab Results  Component Value Date   CREATININE 0.78 03/18/2019    No results found for: PSA  No results found for: TESTOSTERONE  No results found for: HGBA1C  No results found for: TSH  No results found for: CHOL, HDL, CHOLHDL, VLDL, LDLCALC  Lab Results  Component Value Date   AST 16 03/18/2019   Lab Results  Component Value Date   ALT 16 03/18/2019   No components found for: ALKALINEPHOPHATASE No components found for: BILIRUBINTOTAL  No results found for: ESTRADIOL  Urinalysis *** See Epic. I have reviewed the labs.   Assessment & Plan:    1. Urinary retention Managed by SPT - will need an appointment the week of July 19th for SPT exchanged  2. History of hematuria Hematuria work up completed in 01/2018 - findings positive for severe chronic hydronephrosis of the LEFT kidney with severe cortical thinning severe and impaired renal function.  Tiny 1 mm nonobstructing RIGHT renal calculus. No ureterolithiasis or obstructive uropathy.  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  No bladder stones or filling defects in the bladder  which does not excluded a bladder lesion. Recent gross hematuria likely due to infection and SPT trauma Will continue to monitor  3. UTI UA is suspicious for infection - will send  for culture as patient is symptomatic - will start Cipro 250 mg and will adjust if necessary once culture and sensitivities are available   No follow-ups on file.  These notes generated with voice recognition software. I apologize for typographical errors.  Zara Council, PA-C  [facility] [address]  [place], [place] [address] [phone]

## 2019-04-19 ENCOUNTER — Other Ambulatory Visit: Payer: Self-pay

## 2019-04-19 ENCOUNTER — Other Ambulatory Visit: Payer: Medicare Other | Admitting: Primary Care

## 2019-04-19 ENCOUNTER — Ambulatory Visit: Payer: Medicare Other | Admitting: Urology

## 2019-04-19 DIAGNOSIS — Z515 Encounter for palliative care: Secondary | ICD-10-CM

## 2019-04-19 NOTE — Telephone Encounter (Signed)
Appointment rescheduled.
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
## 2019-04-20 ENCOUNTER — Ambulatory Visit: Payer: Medicare Other

## 2019-04-23 ENCOUNTER — Telehealth: Payer: Self-pay | Admitting: *Deleted

## 2019-04-23 NOTE — Telephone Encounter (Signed)
Per Patient wife [name]).. to cancel 04/24/19  MD appt due to him being in [facility]

## 2019-04-24 ENCOUNTER — Inpatient Hospital Stay: Payer: Medicare Other | Admitting: Oncology

## 2019-04-30 ENCOUNTER — Telehealth: Payer: Self-pay | Admitting: Urology

## 2019-04-30 NOTE — Telephone Encounter (Signed)
Called pt's wife, scheduled pt for tomorrow 05/01/2019  @11 :00am

## 2019-04-30 NOTE — Telephone Encounter (Signed)
Betsy from [facility] called and states that the pt's [place] cath site is infected. Please advise.

## 2019-05-01 ENCOUNTER — Other Ambulatory Visit: Payer: Self-pay

## 2019-05-01 ENCOUNTER — Encounter: Payer: Self-pay | Admitting: Urology

## 2019-05-01 ENCOUNTER — Ambulatory Visit (INDEPENDENT_AMBULATORY_CARE_PROVIDER_SITE_OTHER): Payer: Medicare Other | Admitting: Urology

## 2019-05-01 VITALS — BP 132/78 | HR 60

## 2019-05-01 DIAGNOSIS — R339 Retention of urine, unspecified: Secondary | ICD-10-CM

## 2019-05-01 DIAGNOSIS — Z87448 Personal history of other diseases of urinary system: Secondary | ICD-10-CM | POA: Diagnosis not present

## 2019-05-01 NOTE — Progress Notes (Signed)
05/01/2019 12:39 PM   Michael Bush 02/20/1941 [id number]  Referring provider: Clarisse Gouge, MD [address] [place],  [place] [facility] [place] [address]  Chief Complaint  Patient presents with  . Urinary Retention    HPI: 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    He was managing his retention with CIC, but it had become more difficult for him and therefore a SPT was placed.  He presented to the ED on 03/18/2019 with the complaint of fevers and hematuria.  Contrast CT noted suprapubic catheter coiled within the urinary bladder which is decompressed. Mild inflammatory or infectious thickening along the catheter course. No evidence of abscess formation.  Stable chronic left hydronephrosis and hydroureter to the level of the distal 2/3 of the left ureter.  Distended gallbladder with a sliver of pericholecystic fluid.  Please correlate clinically to exclude acalculous cholecystitis.  Multiloculated area of hypoattenuation in the pancreatic body measuring 4.7 cm in greatest dimension, grossly stable from the CT dated 11/30/2017. 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  Evaluation with MRI of the abdomen, when clinically feasible may be considered.  Labs in the ED:  UA was nitrite positive, > 50 RBC's and WBC's, urine culture was positive for multiple organisms, WBC count was 8.5 and creatinine 0.78.  Meds given in the ED: Omnicef and Maxipime.     Prior urological history:  PCNL in 2010 and ureteroscopic stone removal 2018.  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  It was noted that his SPT was on tension.    At his visit on 03/29/2019, his urine was suspicious for  infection and it was sent for culture.  It grew out Citrobacter and Pseudomonas.   He was placed on Cipro.    On 04/13/2019, he was up sized to a 14f council catheter.    Today, his wife states that home health was concerned about the SPT site and advised that it be looked out.  Michael Bush states that it has been bleeding some with some pus.    PMH: Past Medical History:  Diagnosis Date  . Arthritis   . CHF (congestive heart failure) ([place])   . Chronic kidney disease    chronic kidney disease  . Colon cancer ([place]) 2003   Hx Partial colon resection, chemo + rad tx's.  Marland Kitchen COPD (chronic obstructive pulmonary disease) ([place])   . Coronary artery disease   . Diabetes mellitus without complication ([place])   . Dysrhythmia    atrial fib., bradycardia  . GERD (gastroesophageal reflux disease)   . Hypertension   . Hypothyroidism   . Long term (current) use of anticoagulants   . Mixed hearing loss, unilateral    03/07/14  . Myocardial infarction ([place])   . Presence of permanent cardiac pacemaker    [employer]ol[employer] DDDR [phone]  . Pseudophakia of both eyes   . Sick sinus syndrome ([place]) 04/26/2014  . Stroke [facility])     Surgical History: Past Surgical History:  Procedure Laterality Date  . Cardioversion External    . Cataract cortical ssenile    . COLON SURGERY    . COLON SURGERY    . COLONOSCOPY WITH PROPOFOL  N/A 01/27/2018   Procedure: COLONOSCOPY WITH PROPOFOL;  Surgeon: Lollie Sails, MD;  [facility] ENDOSCOPY;  Service: Endoscopy;  Laterality: N/A;  . CORONARY ANGIOPLASTY    . Insertion dual chamber pacemaker generator    . IR CATHETER TUBE CHANGE  03/16/2019  . IR CATHETER TUBE CHANGE  04/13/2019    Home Medications:  Allergies as of 05/01/2019      Reactions   Vancomycin Swelling   Facial swelling   Dabigatran Other (See Comments)   Bleeding complications Bleeding complications   Pollen Extract    Pradaxa [dabigatran Etexilate Mesylate]       Medication List        Accurate as of May 01, 2019 12:39 PM. If you have any questions, ask your nurse or doctor.        STOP taking these medications   cefdinir 300 MG capsule Commonly known as: OMNICEF Stopped by: Zara Council, PA-C     TAKE these medications   acetaminophen 325 MG tablet Commonly known as: TYLENOL Take 650 mg by mouth every 6 (six) hours as needed for mild pain or moderate pain.   albuterol 108 (90 Base) MCG/ACT inhaler Commonly known as: VENTOLIN HFA Inhale 2 puffs into the lungs every 6 (six) hours as needed.   aspirin 81 MG chewable tablet Chew 81 mg by mouth every morning.   atorvastatin 40 MG tablet Commonly known as: LIPITOR TK 1 T PO D   Calcium Carb-Ergocalciferol 500-200 MG-UNIT Tabs Take by mouth.   ciprofloxacin 250 MG tablet Commonly known as: Cipro Take 1 tablet (250 mg total) by mouth 2 (two) times daily.   citalopram 20 MG tablet Commonly known as: CELEXA Take 20 mg by mouth daily.   doxycycline 100 MG capsule Commonly known as: VIBRAMYCIN Take 1 capsule (100 mg total) by mouth 2 (two) times daily.   famotidine 20 MG tablet Commonly known as: PEPCID Take 20 mg by mouth 2 (two) times daily.   glipiZIDE 5 MG tablet Commonly known as: GLUCOTROL Take 5 mg by mouth 2 (two) times daily.   insulin aspart 100 UNIT/ML FlexPen Commonly known as: NOVOLOG Take Insulin as directed by sliding scale   ketotifen 0.025 % ophthalmic solution Commonly known as: ZADITOR INT 1 GTT IN OU BID FOR ALLERGIES OR ITCHING   KP Fish Oil 1200 MG Caps Take by mouth.   lactulose 20 g packet Commonly known as: CEPHULAC Take 1 packet (20 g total) by mouth daily as needed (constipation).   NEOMYCIN-POLYMYXIN-HYDROCORTISONE 1 % Soln OTIC solution Commonly known as: CORTISPORIN   nitroGLYCERIN 0.4 MG SL tablet Commonly known as: NITROSTAT Place 1 tablet (0.4 mg total) under the tongue every 5 (five) minutes as needed for chest pain.   sertraline 25 MG tablet  Commonly known as: ZOLOFT Take by mouth.   sulfamethoxazole-trimethoprim 800-160 MG tablet Commonly known as: BACTRIM DS Take 1 tablet by mouth every 12 (twelve) hours.   tamsulosin 0.4 MG Caps capsule Commonly known as: FLOMAX Take 1 capsule (0.4 mg total) by mouth daily.   tiZANidine 2 MG tablet Commonly known as: ZANAFLEX   traMADol 50 MG tablet Commonly known as: ULTRAM Take 1 tablet (50 mg total) by mouth every 6 (six) hours as needed.   traZODone 50 MG tablet Commonly known as: DESYREL Take by mouth.   VITAMIN B COMPLEX PO Take 2 tablets by mouth daily.   vitamin B-12 1000 MCG tablet Commonly known as: CYANOCOBALAMIN Take 1,000 mcg by mouth daily.  warfarin 3 MG tablet Commonly known as: COUMADIN Take 2.5 mg by mouth daily at 6 PM. Take 3MG  by mouth daily except on Friday take 5MG  by mouth       Allergies:  Allergies  Allergen Reactions  . Vancomycin Swelling    Facial swelling  . Dabigatran Other (See Comments)    Bleeding complications Bleeding complications   . Pollen Extract   . Pradaxa [Dabigatran Etexilate Mesylate]     Family History: Family History  Problem Relation Age of Onset  . Cancer Father     Social History:  reports that he has quit smoking. His smoking use included cigarettes. He has a 60.00 pack-year smoking history. He has never used smokeless tobacco. He reports that he does not drink alcohol or use drugs.  ROS: UROLOGY Frequent Urination?: No Hard to postpone urination?: No Burning/pain with urination?: No Get up at night to urinate?: No Leakage of urine?: No Urine stream starts and stops?: No Trouble starting stream?: No Do you have to strain to urinate?: No Blood in urine?: No Urinary tract infection?: No Sexually transmitted disease?: No Injury to kidneys or bladder?: No Painful intercourse?: No Weak stream?: No Erection problems?: No Penile pain?: No  Gastrointestinal Nausea?: No Vomiting?: No  Indigestion/heartburn?: No Diarrhea?: No Constipation?: No  Constitutional Fever: No Night sweats?: No Weight loss?: No Fatigue?: Yes  Skin Skin rash/lesions?: No Itching?: No  Eyes Blurred vision?: Yes Double vision?: No  Ears/Nose/Throat Sore throat?: No Sinus problems?: No  Hematologic/Lymphatic Swollen glands?: No Easy bruising?: No  Cardiovascular Leg swelling?: Yes Chest pain?: No  Respiratory Cough?: Yes Shortness of breath?: No  Endocrine Excessive thirst?: No  Musculoskeletal Back pain?: No Joint pain?: No  Neurological Headaches?: No Dizziness?: No  Psychologic Depression?: Yes Anxiety?: Yes  Physical Exam: BP 132/78 (BP Location: Left Arm, Patient Position: Sitting, Cuff Size: Normal)   Pulse 60   Constitutional:  Well nourished. Alert and oriented, No acute distress. HEENT: [place] AT, moist mucus membranes.  Trachea midline, no masses. Cardiovascular: No clubbing, cyanosis, or edema. Respiratory: Normal respiratory effort, no increased work of breathing. GI: Abdomen is soft, non tender, non distended, no abdominal masses. SPT tubing securely taped with copious amount of clear tape.  A 10 mm granulation tissue noted at the insertion site. Otherwise insertion site is clean and dry.  GU: No CVA tenderness.  No bladder fullness or masses.  Patient with circumcised phallus.   Urethral meatus is patent.  No penile discharge. No penile lesions or rashes. Scrotum without lesions, cysts, rashes and/or edema.   Neurologic: Grossly intact, no focal deficits, moving all 4 extremities. Psychiatric: Normal mood and affect.  Laboratory Data: Lab Results  Component Value Date   WBC 8.5 03/18/2019   HGB 13.9 03/18/2019   HCT 42.4 03/18/2019   MCV 87.6 03/18/2019   PLT 204 03/18/2019    Lab Results  Component Value Date   CREATININE 0.78 03/18/2019    No results found for: PSA  No results found for: TESTOSTERONE  No results found for: HGBA1C   No results found for: TSH  No results found for: CHOL, HDL, CHOLHDL, VLDL, LDLCALC  Lab Results  Component Value Date   AST 16 03/18/2019   Lab Results  Component Value Date   ALT 16 03/18/2019   No components found for: ALKALINEPHOPHATASE No components found for: BILIRUBINTOTAL  No results found for: ESTRADIOL  I have reviewed the labs.  Suprapubic Cath Change Patient is present today for  a suprapubic catheter change due to urinary retention.  5 ml of water was drained from the balloon, a 16 FR Council cath was removed from the tract with out difficulty.  Sutures were not intact.  Site was cleaned and prepped in a sterile fashion with betadine.  A 16 FR foley cath was replaced into the tract no complications were noted. Urine return was noted, 10 ml of sterile water was inflated into the balloon and a over night bag was attached for drainage.  Patient tolerated well. A night bag was given to patient and proper instruction was given on how to switch bags.  An area of granular tissue was addressed with silver nitrate.    Performed by: Zara Council, PA-C   Assessment & Plan:    1. Urinary retention Managed by SPT Exchanged today RTC in one month for SPT exchange  2. History of hematuria Patient currently under hospice care - will monitor for gross hematuria and intervene only for palliative purposes   Return in about 1 month (around 06/01/2019) for SPT exchange .  These notes generated with voice recognition software. I apologize for typographical errors.  Zara Council, PA-C  [facility] [address]  [place],  [address] [phone]

## 2019-05-04 ENCOUNTER — Emergency Department

## 2019-05-04 ENCOUNTER — Inpatient Hospital Stay
Admission: EM | Admit: 2019-05-04 | Discharge: 2019-05-08 | DRG: 698 | Disposition: A | Discharge status: Hospice | Attending: Internal Medicine | Admitting: Internal Medicine

## 2019-05-04 ENCOUNTER — Inpatient Hospital Stay

## 2019-05-04 ENCOUNTER — Other Ambulatory Visit: Payer: Self-pay

## 2019-05-04 ENCOUNTER — Encounter: Payer: Self-pay | Admitting: Emergency Medicine

## 2019-05-04 DIAGNOSIS — G9341 Metabolic encephalopathy: Secondary | ICD-10-CM | POA: Diagnosis present

## 2019-05-04 DIAGNOSIS — Z9861 Coronary angioplasty status: Secondary | ICD-10-CM

## 2019-05-04 DIAGNOSIS — I130 Hypertensive heart and chronic kidney disease with heart failure and stage 1 through stage 4 chronic kidney disease, or unspecified chronic kidney disease: Secondary | ICD-10-CM | POA: Diagnosis present

## 2019-05-04 DIAGNOSIS — R34 Anuria and oliguria: Secondary | ICD-10-CM | POA: Diagnosis present

## 2019-05-04 DIAGNOSIS — A419 Sepsis, unspecified organism: Secondary | ICD-10-CM | POA: Diagnosis present

## 2019-05-04 DIAGNOSIS — Z87440 Personal history of urinary (tract) infections: Secondary | ICD-10-CM

## 2019-05-04 DIAGNOSIS — E1122 Type 2 diabetes mellitus with diabetic chronic kidney disease: Secondary | ICD-10-CM | POA: Diagnosis present

## 2019-05-04 DIAGNOSIS — Z515 Encounter for palliative care: Secondary | ICD-10-CM | POA: Diagnosis present

## 2019-05-04 DIAGNOSIS — I69354 Hemiplegia and hemiparesis following cerebral infarction affecting left non-dominant side: Secondary | ICD-10-CM

## 2019-05-04 DIAGNOSIS — Z9049 Acquired absence of other specified parts of digestive tract: Secondary | ICD-10-CM

## 2019-05-04 DIAGNOSIS — N170 Acute kidney failure with tubular necrosis: Secondary | ICD-10-CM | POA: Diagnosis present

## 2019-05-04 DIAGNOSIS — N401 Enlarged prostate with lower urinary tract symptoms: Secondary | ICD-10-CM | POA: Diagnosis present

## 2019-05-04 DIAGNOSIS — Z79899 Other long term (current) drug therapy: Secondary | ICD-10-CM

## 2019-05-04 DIAGNOSIS — Z87891 Personal history of nicotine dependence: Secondary | ICD-10-CM

## 2019-05-04 DIAGNOSIS — Z7901 Long term (current) use of anticoagulants: Secondary | ICD-10-CM

## 2019-05-04 DIAGNOSIS — I252 Old myocardial infarction: Secondary | ICD-10-CM

## 2019-05-04 DIAGNOSIS — R509 Fever, unspecified: Secondary | ICD-10-CM

## 2019-05-04 DIAGNOSIS — Z20828 Contact with and (suspected) exposure to other viral communicable diseases: Secondary | ICD-10-CM | POA: Diagnosis present

## 2019-05-04 DIAGNOSIS — J449 Chronic obstructive pulmonary disease, unspecified: Secondary | ICD-10-CM | POA: Diagnosis present

## 2019-05-04 DIAGNOSIS — Z66 Do not resuscitate: Secondary | ICD-10-CM | POA: Diagnosis not present

## 2019-05-04 DIAGNOSIS — E872 Acidosis: Secondary | ICD-10-CM | POA: Diagnosis present

## 2019-05-04 DIAGNOSIS — M1990 Unspecified osteoarthritis, unspecified site: Secondary | ICD-10-CM | POA: Diagnosis present

## 2019-05-04 DIAGNOSIS — N184 Chronic kidney disease, stage 4 (severe): Secondary | ICD-10-CM | POA: Diagnosis present

## 2019-05-04 DIAGNOSIS — E875 Hyperkalemia: Secondary | ICD-10-CM | POA: Diagnosis not present

## 2019-05-04 DIAGNOSIS — N1330 Unspecified hydronephrosis: Secondary | ICD-10-CM | POA: Diagnosis present

## 2019-05-04 DIAGNOSIS — K219 Gastro-esophageal reflux disease without esophagitis: Secondary | ICD-10-CM | POA: Diagnosis present

## 2019-05-04 DIAGNOSIS — Z950 Presence of cardiac pacemaker: Secondary | ICD-10-CM

## 2019-05-04 DIAGNOSIS — R338 Other retention of urine: Secondary | ICD-10-CM | POA: Diagnosis present

## 2019-05-04 DIAGNOSIS — N390 Urinary tract infection, site not specified: Secondary | ICD-10-CM | POA: Diagnosis not present

## 2019-05-04 DIAGNOSIS — N3020 Other chronic cystitis without hematuria: Secondary | ICD-10-CM | POA: Diagnosis present

## 2019-05-04 DIAGNOSIS — E785 Hyperlipidemia, unspecified: Secondary | ICD-10-CM | POA: Diagnosis present

## 2019-05-04 DIAGNOSIS — N179 Acute kidney failure, unspecified: Secondary | ICD-10-CM

## 2019-05-04 DIAGNOSIS — T83518A Infection and inflammatory reaction due to other urinary catheter, initial encounter: Secondary | ICD-10-CM | POA: Diagnosis not present

## 2019-05-04 DIAGNOSIS — Z7984 Long term (current) use of oral hypoglycemic drugs: Secondary | ICD-10-CM

## 2019-05-04 DIAGNOSIS — I4891 Unspecified atrial fibrillation: Secondary | ICD-10-CM | POA: Diagnosis present

## 2019-05-04 DIAGNOSIS — H908 Mixed conductive and sensorineural hearing loss, unspecified: Secondary | ICD-10-CM | POA: Diagnosis present

## 2019-05-04 DIAGNOSIS — I2510 Atherosclerotic heart disease of native coronary artery without angina pectoris: Secondary | ICD-10-CM | POA: Diagnosis present

## 2019-05-04 DIAGNOSIS — I5032 Chronic diastolic (congestive) heart failure: Secondary | ICD-10-CM | POA: Diagnosis present

## 2019-05-04 DIAGNOSIS — Z85038 Personal history of other malignant neoplasm of large intestine: Secondary | ICD-10-CM

## 2019-05-04 DIAGNOSIS — Z961 Presence of intraocular lens: Secondary | ICD-10-CM | POA: Diagnosis present

## 2019-05-04 DIAGNOSIS — Z79891 Long term (current) use of opiate analgesic: Secondary | ICD-10-CM

## 2019-05-04 LAB — URINALYSIS, COMPLETE (UACMP) WITH MICROSCOPIC
Bilirubin Urine: NEGATIVE
Glucose, UA: >=500 mg/dL — AB
Ketones, ur: NEGATIVE mg/dL
Nitrite: POSITIVE — AB
Protein, ur: 100 mg/dL — AB
RBC / HPF: >50 RBC/hpf — ABNORMAL HIGH (ref 0–5)
Specific Gravity, Urine: 1.018 (ref 1.005–1.030)
WBC, UA: >50 WBC/hpf — ABNORMAL HIGH (ref 0–5)
pH: 5 (ref 5.0–8.0)

## 2019-05-04 LAB — COMPREHENSIVE METABOLIC PANEL
ALT: 18 U/L (ref 0–44)
AST: 23 U/L (ref 15–41)
Albumin: 3.7 g/dL (ref 3.5–5.0)
Alkaline Phosphatase: 74 U/L (ref 38–126)
Anion gap: 15 (ref 5–15)
BUN: 35 mg/dL — ABNORMAL HIGH (ref 8–23)
CO2: 23 mmol/L (ref 22–32)
Calcium: 9.2 mg/dL (ref 8.9–10.3)
Chloride: 102 mmol/L (ref 98–111)
Creatinine, Ser: 3.3 mg/dL — ABNORMAL HIGH (ref 0.61–1.24)
GFR calc Af Amer: 20 mL/min — ABNORMAL LOW (ref >60)
GFR calc non Af Amer: 17 mL/min — ABNORMAL LOW (ref >60)
Glucose, Bld: 334 mg/dL — ABNORMAL HIGH (ref 70–99)
Potassium: 4.6 mmol/L (ref 3.5–5.1)
Sodium: 140 mmol/L (ref 135–145)
Total Bilirubin: 1.2 mg/dL (ref 0.3–1.2)
Total Protein: 7.7 g/dL (ref 6.5–8.1)

## 2019-05-04 LAB — CBC WITH DIFFERENTIAL/PLATELET
Abs Immature Granulocytes: 0.17 10*3/uL — ABNORMAL HIGH (ref 0.00–0.07)
Basophils Absolute: 0 10*3/uL (ref 0.0–0.1)
Basophils Relative: 0 %
Eosinophils Absolute: 0 10*3/uL (ref 0.0–0.5)
Eosinophils Relative: 0 %
HCT: 45.4 % (ref 39.0–52.0)
Hemoglobin: 14.8 g/dL (ref 13.0–17.0)
Immature Granulocytes: 1 %
Lymphocytes Relative: 2 %
Lymphs Abs: 0.3 10*3/uL — ABNORMAL LOW (ref 0.7–4.0)
MCH: 28.7 pg (ref 26.0–34.0)
MCHC: 32.6 g/dL (ref 30.0–36.0)
MCV: 88.2 fL (ref 80.0–100.0)
Monocytes Absolute: 1 10*3/uL (ref 0.1–1.0)
Monocytes Relative: 5 %
Neutro Abs: 16.8 10*3/uL — ABNORMAL HIGH (ref 1.7–7.7)
Neutrophils Relative %: 92 %
Platelets: 214 10*3/uL (ref 150–400)
RBC: 5.15 MIL/uL (ref 4.22–5.81)
RDW: 14.1 % (ref 11.5–15.5)
WBC: 18.4 10*3/uL — ABNORMAL HIGH (ref 4.0–10.5)
nRBC: 0 % (ref 0.0–0.2)

## 2019-05-04 LAB — GLUCOSE, CAPILLARY: Glucose-Capillary: 323 mg/dL — ABNORMAL HIGH (ref 70–99)

## 2019-05-04 LAB — PROTIME-INR
INR: 2.1 — ABNORMAL HIGH (ref 0.8–1.2)
Prothrombin Time: 23.5 seconds — ABNORMAL HIGH (ref 11.4–15.2)

## 2019-05-04 LAB — LACTIC ACID, PLASMA
Lactic Acid, Venous: 5.5 mmol/L (ref 0.5–1.9)
Lactic Acid, Venous: 4.1 mmol/L (ref 0.5–1.9)

## 2019-05-04 LAB — SARS CORONAVIRUS 2 BY RT PCR (HOSPITAL ORDER, PERFORMED IN ~~LOC~~ HOSPITAL LAB): SARS Coronavirus 2: NEGATIVE

## 2019-05-04 MED ORDER — SODIUM CHLORIDE 0.9 % IV BOLUS
500.0000 mL | Freq: Once | INTRAVENOUS | Status: AC
  Administered 2019-05-04: 500 mL via INTRAVENOUS

## 2019-05-04 MED ORDER — OXYCODONE HCL 5 MG PO TABS
5.0000 mg | ORAL_TABLET | Freq: Every evening | ORAL | Status: DC | PRN
  Administered 2019-05-06 – 2019-05-07 (×2): 5 mg via ORAL
  Filled 2019-05-04 (×2): qty 1

## 2019-05-04 MED ORDER — TAMSULOSIN HCL 0.4 MG PO CAPS
0.4000 mg | ORAL_CAPSULE | Freq: Every day | ORAL | Status: DC
  Administered 2019-05-05 – 2019-05-08 (×4): 0.4 mg via ORAL
  Filled 2019-05-04 (×4): qty 1

## 2019-05-04 MED ORDER — WARFARIN SODIUM 2.5 MG PO TABS: 2.5000 mg | ORAL_TABLET | ORAL | Status: DC

## 2019-05-04 MED ORDER — SODIUM CHLORIDE 0.9 % IV SOLN
1.0000 g | Freq: Once | INTRAVENOUS | Status: AC
  Administered 2019-05-04: 1 g via INTRAVENOUS
  Filled 2019-05-04: qty 10

## 2019-05-04 MED ORDER — SODIUM CHLORIDE 0.9 % IV SOLN
1.0000 g | INTRAVENOUS | Status: DC
  Administered 2019-05-05 – 2019-05-07 (×3): 1 g via INTRAVENOUS
  Filled 2019-05-04 (×3): qty 1
  Filled 2019-05-04: qty 10

## 2019-05-04 MED ORDER — ACETAMINOPHEN 325 MG PO TABS
650.0000 mg | ORAL_TABLET | Freq: Four times a day (QID) | ORAL | Status: DC | PRN
  Administered 2019-05-04 – 2019-05-08 (×5): 650 mg via ORAL
  Filled 2019-05-04 (×5): qty 2

## 2019-05-04 MED ORDER — TRAZODONE HCL 50 MG PO TABS
50.0000 mg | ORAL_TABLET | Freq: Every day | ORAL | Status: DC
  Administered 2019-05-04 – 2019-05-06 (×3): 50 mg via ORAL
  Filled 2019-05-04 (×3): qty 1

## 2019-05-04 MED ORDER — CITALOPRAM HYDROBROMIDE 20 MG PO TABS: 20.0000 mg | ORAL_TABLET | Freq: Every day | ORAL | Status: DC

## 2019-05-04 MED ORDER — NEOMYCIN-POLYMYXIN-HC 1 % OT SOLN: 3.0000 [drp] | Freq: Four times a day (QID) | OTIC | Status: DC

## 2019-05-04 MED ORDER — WARFARIN - PHYSICIAN DOSING INPATIENT
Freq: Every day | Status: DC
  Filled 2019-05-04: qty 1

## 2019-05-04 MED ORDER — WARFARIN SODIUM 2.5 MG PO TABS: 2.5000 mg | ORAL_TABLET | Freq: Every day | ORAL | Status: DC

## 2019-05-04 MED ORDER — WARFARIN SODIUM 2.5 MG PO TABS
2.5000 mg | ORAL_TABLET | Freq: Once | ORAL | Status: AC
  Administered 2019-05-04: 2.5 mg via ORAL
  Filled 2019-05-04: qty 1

## 2019-05-04 MED ORDER — KETOTIFEN FUMARATE 0.025 % OP SOLN
1.0000 [drp] | Freq: Two times a day (BID) | OPHTHALMIC | Status: DC
  Administered 2019-05-06: 1 [drp] via OPHTHALMIC
  Filled 2019-05-04: qty 5

## 2019-05-04 MED ORDER — INSULIN ASPART 100 UNIT/ML ~~LOC~~ SOLN
0.0000 [IU] | Freq: Three times a day (TID) | SUBCUTANEOUS | Status: DC
  Administered 2019-05-05 (×2): 3 [IU] via SUBCUTANEOUS
  Administered 2019-05-05 – 2019-05-06 (×3): 2 [IU] via SUBCUTANEOUS
  Administered 2019-05-06 – 2019-05-07 (×3): 1 [IU] via SUBCUTANEOUS
  Filled 2019-05-04 (×8): qty 1

## 2019-05-04 MED ORDER — INSULIN ASPART 100 UNIT/ML ~~LOC~~ SOLN
0.0000 [IU] | Freq: Every day | SUBCUTANEOUS | Status: DC
  Administered 2019-05-04: 4 [IU] via SUBCUTANEOUS
  Filled 2019-05-04: qty 1

## 2019-05-04 MED ORDER — FAMOTIDINE 20 MG PO TABS
20.0000 mg | ORAL_TABLET | Freq: Every day | ORAL | Status: DC
  Administered 2019-05-05 – 2019-05-08 (×4): 20 mg via ORAL
  Filled 2019-05-04 (×4): qty 1

## 2019-05-04 MED ORDER — VITAMIN B-12 1000 MCG PO TABS
1000.0000 ug | ORAL_TABLET | Freq: Every day | ORAL | Status: DC
  Administered 2019-05-04 – 2019-05-08 (×5): 1000 ug via ORAL
  Filled 2019-05-04 (×5): qty 1

## 2019-05-04 MED ORDER — ATORVASTATIN CALCIUM 20 MG PO TABS
40.0000 mg | ORAL_TABLET | Freq: Every day | ORAL | Status: DC
  Administered 2019-05-05 – 2019-05-07 (×3): 40 mg via ORAL
  Filled 2019-05-04 (×3): qty 2

## 2019-05-04 MED ORDER — NITROGLYCERIN 0.4 MG SL SUBL: 0.4000 mg | SUBLINGUAL_TABLET | SUBLINGUAL | Status: DC | PRN

## 2019-05-04 MED ORDER — WARFARIN SODIUM 3 MG PO TABS: 3.0000 mg | ORAL_TABLET | ORAL | Status: DC

## 2019-05-04 MED ORDER — SODIUM CHLORIDE 0.9 % IV BOLUS
1000.0000 mL | Freq: Once | INTRAVENOUS | Status: AC
  Administered 2019-05-04: 1000 mL via INTRAVENOUS

## 2019-05-04 MED ORDER — SERTRALINE HCL 50 MG PO TABS
25.0000 mg | ORAL_TABLET | Freq: Every day | ORAL | Status: DC
  Administered 2019-05-05 – 2019-05-08 (×4): 25 mg via ORAL
  Filled 2019-05-04 (×4): qty 1

## 2019-05-04 MED ORDER — FAMOTIDINE 20 MG PO TABS: 20.0000 mg | ORAL_TABLET | Freq: Two times a day (BID) | ORAL | Status: DC

## 2019-05-04 NOTE — ED Notes (Signed)
[facility]: [phone], called at this time for update. States call if anything is needed.

## 2019-05-04 NOTE — ED Notes (Signed)
Ultrasound at bedside

## 2019-05-04 NOTE — ED Notes (Signed)
Date and time results received: 05/04/19 6:11 PM  (use smartphrase ".now" to insert current time)  Test: Lactic Critical Value: 5.5  Name of Provider Notified: Dr. Burlene Arnt  Orders Received? Or Actions Taken?: Orders Received - See Orders for details

## 2019-05-04 NOTE — Progress Notes (Signed)
Family Meeting Note  Advance Directive:yes  Today a meeting took place with the spouse.  Patient is unable to participate due [place]:MMOCAR capacity confusion   The following clinical team members were present during this meeting:MD  The following were discussed:Patient's diagnosis: sepsis, ac renal failure, uti, supra pubic catheter, stroke, Patient's progosis: Unable to determine and Goals for treatment: DNR  Additional follow-up to be provided: Urology  Time spent during discussion:20 minutes  Vaughan Basta, MD

## 2019-05-04 NOTE — ED Triage Notes (Signed)
EMS brought pt from home when the pt had blood in his cath bag two days ago. The pt went on hospice two weeks ago. When EMS picked up the patient, they got an axillary temp of 100.8, but when he got here had an oral temp of 98.7. EMS gave Zofran in route because the pt has had n&v since last night. Per EMS pt altered from baseline at this time, unable to state how patient is altered.

## 2019-05-04 NOTE — Progress Notes (Signed)
ANTICOAGULATION CONSULT NOTE - Initial Consult  Pharmacy Consult for Warfarin Indication: atrial fibrillation (home medication)  Allergies  Allergen Reactions  . Vancomycin Swelling    Facial swelling  . Dabigatran Other (See Comments)    Bleeding complications Bleeding complications   . Pollen Extract   . Pradaxa [Dabigatran Etexilate Mesylate]    Patient Measurements: Height: 5\' 10"  (177.8 cm) Weight: 176 lb (79.8 kg) IBW/kg (Calculated) : 73  Vital Signs: Temp: 98.9 F (37.2 C) (08/07 1730) Temp Source: Oral (08/07 1730) BP: 135/59 (08/07 2100) Pulse Rate: 58 (08/07 2100)  Labs: Recent Labs    05/04/19 1725  HGB 14.8  HCT 45.4  PLT 214  LABPROT 23.5*  INR 2.1*  CREATININE 3.30*   Estimated Creatinine Clearance: 18.7 mL/min (A) (by C-G formula based on SCr of 3.3 mg/dL (H)).  Medical History: Past Medical History:  Diagnosis Date  . Arthritis   . CHF (congestive heart failure) ([place])   . Chronic kidney disease    chronic kidney disease  . Colon cancer ([place]) 2003   Hx Partial colon resection, chemo + rad tx's.  Marland Kitchen COPD (chronic obstructive pulmonary disease) ([place])   . Coronary artery disease   . Diabetes mellitus without complication ([place])   . Dysrhythmia    atrial fib., bradycardia  . GERD (gastroesophageal reflux disease)   . Hypertension   . Hypothyroidism   . Long term (current) use of anticoagulants   . Mixed hearing loss, unilateral    03/07/14  . Myocardial infarction ([place])   . Presence of permanent cardiac pacemaker    [employer] DDDR [phone]  . Pseudophakia of both eyes   . Sick sinus syndrome ([place]) 04/26/2014  . Stroke [facility])    Medications:  (Not in a hospital admission)  Assessment: 78yo male on chronic Warfarin PTA.  INR 2.1 today when admitted.  Pt has had some vomiting and is now acutely ill.  Home dose listed as 3mg  on Sun, Mon, and Fri and 2.5mg  all other days.  Will give reduced dose of 2.5mg  tonight given acute  illness and noted vomiting.   Home Regimen: Warfarin 3mg  Sun, Mon, Fri                              Warfarin  2.5mg  Tues, Weds, Thurs, Sat  DATE INR DOSE 8/7 2.1  Goal of Therapy:  INR 2-3 Monitor platelets by anticoagulation protocol: Yes   Plan:  Warfarin 2.5mg  po tonight x 1 dose F/u PT/INR tomorrow  Hart Robinsons A 05/04/2019,10:13 PM

## 2019-05-04 NOTE — H&P (Signed)
[facility] [place] NAME: Michael Bush    MR#:  [id number]  DATE OF BIRTH:  01-23-1941  DATE OF ADMISSION:  05/04/2019  PRIMARY CARE PHYSICIAN: Clarisse Gouge, MD   REQUESTING/REFERRING PHYSICIAN: Charlotte Crumb, MD  CHIEF COMPLAINT:   Chief Complaint  Patient presents with  . 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  Patient is hard of hearing with altered mental status from baseline so history mostly obtained from patient's wife who is currently at the bedside.  Per patient's wife, patient has not been feeling well since yesterday.  He complained of nausea and inability to keep oral intake down.  Patient has not had urine output since yesterday and was noted to have dark blood tinged urine in his catheter.  Patient's wife states he has been more confused since yesterday and lethargic.  Denies associated symptoms of chest pain, shortness of breath, abdominal pain, diarrhea, cough or any other neurological symptoms.  Patient's wife was concerned with another possible UTI therefore brought him to the ED for further evaluation.  On arrival to the ED, he was afebrile with blood pressure 142/60 mm Hg and pulse rate 59 beats/min. There were no focal neurological deficits; he was alert and oriented x 1-2.  Initial labs revealed glucose 334, BUN 35, creatinine 3.3, WBC 18.4, lactic acid 5.5 and UA positive for UTI.  Chest x-ray with no acute abnormalities.  Patient received IV fluid  bolus and was started on empiric antibiotics in the ED.  Patient will be admitted under hospitalist service for further management.  PAST MEDICAL HISTORY:   Past Medical History:  Diagnosis Date  . Arthritis   . CHF (congestive heart failure) ([place])   . Chronic kidney disease    chronic kidney disease  . Colon cancer ([place]) 2003   Hx Partial colon resection, chemo + rad tx's.  Marland Kitchen COPD (chronic obstructive pulmonary disease) ([place])   . Coronary artery disease   . Diabetes mellitus without complication ([place])   . Dysrhythmia    atrial fib., bradycardia  . GERD (gastroesophageal reflux disease)   . Hypertension   . Hypothyroidism   . Long term (current) use of anticoagulants   . Mixed hearing loss, unilateral    03/07/14  . Myocardial infarction ([place])   . Presence of permanent cardiac pacemaker    [employer] DDDR [phone]  . Pseudophakia of both eyes   . Sick sinus syndrome ([place]) 04/26/2014  . Stroke [facility])     PAST SURGICAL HISTORY:   Past Surgical History:  Procedure Laterality Date  . Cardioversion External    . Cataract cortical ssenile    . COLON SURGERY    . COLON SURGERY    . COLONOSCOPY WITH PROPOFOL N/A 01/27/2018   Procedure: COLONOSCOPY WITH PROPOFOL;  Surgeon: Lollie Sails, MD;  Location[facility] ENDOSCOPY;  Service: Endoscopy;  Laterality: N/A;  . CORONARY ANGIOPLASTY    . Insertion dual chamber pacemaker generator    . IR CATHETER TUBE CHANGE  03/16/2019  . IR CATHETER TUBE  CHANGE  04/13/2019    SOCIAL HISTORY:   Social History   Tobacco Use  . Smoking status: Former Smoker    Packs/day: 2.00    Years: 30.00    Pack years: 60.00    Types: Cigarettes  . Smokeless tobacco: Never Used  Substance Use Topics  . Alcohol use: No    FAMILY HISTORY:   Family History  Problem Relation Age of Onset  . Cancer Father     DRUG ALLERGIES:   Allergies  Allergen Reactions  . Vancomycin Swelling    Facial swelling  . Dabigatran Other (See  Comments)    Bleeding complications Bleeding complications   . Pollen Extract   . Pradaxa [Dabigatran Etexilate Mesylate]     REVIEW OF SYSTEMS:   ROS Unable to obtain due to current altered mental status  MEDICATIONS AT HOME:   Prior to Admission medications   Medication Sig Start Date End Date Taking? Authorizing Provider  atorvastatin (LIPITOR) 40 MG tablet Take 40 mg by mouth daily at 6 PM.  05/02/18  Yes [provider]  B Complex Vitamins (VITAMIN B COMPLEX PO) Take 2 tablets by mouth daily.   Yes [provider]  Calcium Carb-Ergocalciferol 500-200 MG-UNIT TABS Take 1 tablet by mouth daily.    Yes [provider]  glipiZIDE (GLUCOTROL) 5 MG tablet Take 5 mg by mouth 2 (two) times daily.  12/08/17  Yes [provider]  Omega-3 Fatty Acids (KP FISH OIL) 1200 MG CAPS Take by mouth.   Yes [provider]  oxyCODONE (OXY IR/ROXICODONE) 5 MG immediate release tablet Take 5 mg by mouth at bedtime as needed for severe pain.   Yes [provider]  sertraline (ZOLOFT) 25 MG tablet Take 25 mg by mouth daily.  08/10/18  Yes [provider]  tamsulosin (FLOMAX) 0.4 MG CAPS capsule Take 1 capsule (0.4 mg total) by mouth daily. 02/15/19  Yes Stoioff, Ronda Fairly, MD  tiZANidine (ZANAFLEX) 2 MG tablet Take 2 mg by mouth at bedtime.  11/25/16  Yes [provider]  traZODone (DESYREL) 50 MG tablet Take 50 mg by mouth at bedtime.  08/10/18  Yes [provider]  vitamin B-12 (CYANOCOBALAMIN) 1000 MCG tablet Take 1,000 mcg by mouth daily.    Yes [provider]  warfarin (COUMADIN) 3 MG tablet Take 2.5-3 mg by mouth daily at 6 PM. Take 3 mg by mouth on Sunday, Monday and Friday and 2.5 mg on Tuesday, Wednesday, Thursday and Saturday   Yes [provider]  acetaminophen (TYLENOL) 325 MG tablet Take 650 mg by mouth every 6 (six) hours as needed for mild pain or moderate pain.  10/11/14   [provider]   albuterol (PROVENTIL HFA;VENTOLIN HFA) 108 (90 Base) MCG/ACT inhaler Inhale 2 puffs into the lungs every 6 (six) hours as needed. 10/15/18   Schaevitz, Randall An, MD  ciprofloxacin (CIPRO) 250 MG tablet Take 1 tablet (250 mg total) by mouth 2 (two) times daily. Patient not taking: Reported on 05/04/2019 03/29/19   Zara Council A, PA-C  citalopram (CELEXA) 20 MG tablet Take 20 mg by mouth daily. 03/14/17 03/14/18  [provider]  doxycycline (VIBRAMYCIN) 100 MG capsule Take 1 capsule (100 mg total) by mouth 2 (two) times daily. Patient not taking: Reported on 05/04/2019 10/15/18   Orbie Pyo, MD  famotidine (PEPCID) 20 MG tablet Take 20 mg by mouth 2 (two) times daily.  10/14/17 10/14/18  [provider]  ketotifen (ZADITOR)  0.025 % ophthalmic solution INT 1 GTT IN OU BID FOR ALLERGIES OR ITCHING 11/29/17   [provider]  lactulose (CEPHULAC) 20 g packet Take 1 packet (20 g total) by mouth daily as needed (constipation). Patient not taking: Reported on 05/04/2019 12/22/17   Orbie Pyo, MD  NEOMYCIN-POLYMYXIN-HYDROCORTISONE (CORTISPORIN) 1 % SOLN OTIC solution  04/27/18   [provider]  nitroGLYCERIN (NITROSTAT) 0.4 MG SL tablet Place 1 tablet (0.4 mg total) under the tongue every 5 (five) minutes as needed for chest pain. 03/08/18   Bettey Costa, MD  sulfamethoxazole-trimethoprim (BACTRIM DS) 800-160 MG tablet Take 1 tablet by mouth every 12 (twelve) hours. Patient not taking: Reported on 05/04/2019 02/01/19   Abbie Sons, MD  traMADol (ULTRAM) 50 MG tablet Take 1 tablet (50 mg total) by mouth every 6 (six) hours as needed. Patient not taking: Reported on 05/04/2019 10/12/18   Versie Starks, PA-C      VITAL SIGNS:  Blood pressure (!) 131/56, pulse 60, temperature 98.9 F (37.2 C), temperature source Oral, resp. rate (!) 29, height 5\' 10"  (1.778 m), weight 79.8 kg, SpO2 93 %.  PHYSICAL EXAMINATION:   Physical Exam  GENERAL:  78  y.o.-year-old patient lying in the bed with no acute distress.  EYES: Pupils equal, round, reactive to light and accommodation. No scleral icterus. Extraocular muscles intact.  HEENT: Head atraumatic, normocephalic. Oropharynx and nasopharynx clear.  NECK:  Supple, no jugular venous distention. No thyroid enlargement, no tenderness.  LUNGS: Normal breath sounds bilaterally, no wheezing, rales,rhonchi or crepitation. No use of accessory muscles of respiration.  CARDIOVASCULAR: S1, S2 normal. No murmurs, rubs, or gallops.  ABDOMEN: Soft, nontender, nondistended. Bowel sounds present. No organomegaly or mass.  EXTREMITIES: No pedal edema, cyanosis, or clubbing. No rash or lesions. + pedal pulses MUSCULOSKELETAL: UTA due to hard of hearing and AMS. NEUROLOGIC: Patient awake, oriented x 1. Follows commands on the right but very hard of hearing. No aphasia. Left sided hemiplegia. Left lower extremity with 1/5 strength. Cannot break gravity.  Moves right side without difficulty, strength normal. Right arm orbits left. Plantars up on the right, down on the left. Decreased sensation on the left side of the body. Gait not tested due to safety concern. PSYCHIATRIC: The patient is alert and oriented x 3.  SKIN: No obvious rash, lesion, or ulcer.   DATA REVIEWED:  LABORATORY PANEL:   CBC Recent Labs  Lab 05/04/19 1725  WBC 18.4*  HGB 14.8  HCT 45.4  PLT 214   ------------------------------------------------------------------------------------------------------------------  Chemistries  Recent Labs  Lab 05/04/19 1725  NA 140  K 4.6  CL 102  CO2 23  GLUCOSE 334*  BUN 35*  CREATININE 3.30*  CALCIUM 9.2  AST 23  ALT 18  ALKPHOS 74  BILITOT 1.2   ------------------------------------------------------------------------------------------------------------------  Cardiac Enzymes No results for input(s): TROPONINI in the last 168 hours.  ------------------------------------------------------------------------------------------------------------------  RADIOLOGY:  Dg Chest Port 1 View  Result Date: 05/04/2019 CLINICAL DATA:  Blood in catheterization bag. EXAM: PORTABLE CHEST 1 VIEW COMPARISON:  March 18, 2019 FINDINGS: Stable pacemaker. Stable cardiomegaly. The hila, mediastinum, lungs, and pleura are otherwise unremarkable. IMPRESSION: No acute abnormalities. Electronically Signed   By: Dorise Bullion III M.D   On: 05/04/2019 18:23   EKG:  EKG: Ventricular paced. Vent. rate 60 BPM PR interval * ms QRS duration 151 ms QT/QTc 445/445 ms P-R-T axes 0 -77 107 IMPRESSION AND PLAN:   78 y.o. 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  1. Sepsis -likely secondary to UTI.  Patient presenting with fever decreased urine output, confusion with elevated lactic acid 5.5 and leukocytosis meeting sepsis criteria.  Treated with IV fluid bolus per sepsis protocol -Admit to MedSurg floor -Chest x-ray reviewed shows no acute abnormality -Blood cultures pending -UA positive for UTI -Urine cultures pending -Start empiric with ceftriaxone  2. Acute kidney injury superimposed on CKD -BUN 35, creatinine 3.3 -Likely prerenal -Holding nephrotoxins -Gentle IV fluids -Check Renal ultrasound -Consider Nephrology consult if no improvement or with abnormal [place]  3. Atrial fibrillation -on Coumadin  4. Diabetes mellitus type 2 - Hold glipizide in the setting of AKI - Start sliding scale  5. Hyperlipidemia -Atorvastatin 40 mg q. 1800  6. History  of CVA with residual left-sided hemiplegia - On Coumadin for thrombosis -PT/INR supratherapeutic. Pharmacy to dose  7. Hypertension - Stable  8. Urinary retention-managed by SPT with planned exchange on 06/01/2019 - Monitor for gross hematuria - Urology consult  Patient is currently under [facility].  Contact [phone] for updates  All the records are reviewed and case discussed with ED provider. Management plans discussed with the patient, family and they are in agreement.  CODE STATUS: DNR  TOTAL TIME TAKING CARE OF THIS PATIENT: 50 minutes.    on 05/04/2019 at 7:56 PM  Rufina Falco, DNP, FNP-BC [facility] Nurse Practitioner Between 7am to 6pm - Pager [phone]  After 6pm go to [email or web address] - password [email or web address] [facility]  Office  [phone]  CC: Primary care physician; Clarisse Gouge, MD

## 2019-05-04 NOTE — ED Notes (Signed)
ED TO INPATIENT HANDOFF REPORT  ED Nurse Name and Phone #: gracie  S Name/Age/Gender Michael Bush 78 y.o. male Room/Bed: ED01A/ED01A  Code Status   Code Status: DNR  Home/SNF/Other Home Patient oriented to: self Is this baseline? No   Triage Complete: Triage complete  Chief Complaint Poss Covid  Triage Note EMS brought pt from home when the pt had blood in his cath bag two days ago. The pt went on hospice two weeks ago. When EMS picked up the patient, they got an axillary temp of 100.8, but when he got here had an oral temp of 98.7. EMS gave Zofran in route because the pt has had n&v since last night. Per EMS pt altered from baseline at this time, unable to state how patient is altered.    Allergies Allergies  Allergen Reactions  . Vancomycin Swelling    Facial swelling  . Dabigatran Other (See Comments)    Bleeding complications Bleeding complications   . Pollen Extract   . Pradaxa [Dabigatran Etexilate Mesylate]     Level of Care/Admitting Diagnosis ED Disposition    ED Disposition Condition [facility]: [place] [100120]  Level of Care: Med-Surg [16]  Covid Evaluation: Person Under Investigation (PUI)  Diagnosis: Sepsis due to urinary tract infection [facility]) [[id number]]  Admitting Physician: Eula Flax  Attending Physician: Rufina Falco ACHIENG [phone]  Estimated length of stay: past midnight tomorrow  Certification:: I certify this patient will need inpatient services for at least 2 midnights  PT Class (Do Not Modify): Inpatient [101]  PT Acc Code (Do Not Modify): Private [1]       B Medical/Surgery History Past Medical History:  Diagnosis Date  . Arthritis   . CHF (congestive heart failure) ([place])   . Chronic kidney disease    chronic kidney disease  . Colon cancer ([place]) 2003   Hx Partial colon resection, chemo + rad tx's.  Marland Kitchen COPD (chronic obstructive pulmonary disease) ([place])    . Coronary artery disease   . Diabetes mellitus without complication ([place])   . Dysrhythmia    atrial fib., bradycardia  . GERD (gastroesophageal reflux disease)   . Hypertension   . Hypothyroidism   . Long term (current) use of anticoagulants   . Mixed hearing loss, unilateral    03/07/14  . Myocardial infarction ([place])   . Presence of permanent cardiac pacemaker    [employer] DDDR [phone]  . Pseudophakia of both eyes   . Sick sinus syndrome ([place]) 04/26/2014  . Stroke [facility])    Past Surgical History:  Procedure Laterality Date  . Cardioversion External    . Cataract cortical ssenile    . COLON SURGERY    . COLON SURGERY    . COLONOSCOPY WITH PROPOFOL N/A 01/27/2018   Procedure: COLONOSCOPY WITH PROPOFOL;  Surgeon: Lollie Sails, MD;  Location: [facility] ENDOSCOPY;  Service: Endoscopy;  Laterality: N/A;  . CORONARY ANGIOPLASTY    . Insertion dual chamber pacemaker generator    . IR CATHETER TUBE CHANGE  03/16/2019  . IR CATHETER TUBE CHANGE  04/13/2019     A IV Location/Drains/Wounds Patient Lines/Drains/Airways Status   Active Line/Drains/Airways    Name:   Placement date:   Placement time:   Site:   Days:   Peripheral IV 05/04/19 Anterior;Distal;Right Forearm   05/04/19    1709    Forearm   less than 1   Peripheral IV 05/04/19 Right Forearm  05/04/19    1744    Forearm   less than 1   Peripheral IV 05/04/19 Right Antecubital   05/04/19    1744    Antecubital   less than 1   Peripheral IV 05/04/19 Right Forearm   05/04/19    1744    Forearm   less than 1   Suprapubic Catheter 16 Fr.   04/13/19    1443    -   21          Intake/Output Last 24 hours  Intake/Output Summary (Last 24 hours) at 05/04/2019 2155 Last data filed at 05/04/2019 2126 Gross per 24 hour  Intake 2500 ml  Output -  Net 2500 ml    Labs/Imaging Results for orders placed or performed during the hospital encounter of 05/04/19 (from the past 48 hour(s))  Comprehensive metabolic panel      Status: Abnormal   Collection Time: 05/04/19  5:25 PM  Result Value Ref Range   Sodium 140 135 - 145 mmol/L   Potassium 4.6 3.5 - 5.1 mmol/L   Chloride 102 98 - 111 mmol/L   CO2 23 22 - 32 mmol/L   Glucose, Bld 334 (H) 70 - 99 mg/dL   BUN 35 (H) 8 - 23 mg/dL   Creatinine, Ser 3.30 (H) 0.61 - 1.24 mg/dL   Calcium 9.2 8.9 - 10.3 mg/dL   Total Protein 7.7 6.5 - 8.1 g/dL   Albumin 3.7 3.5 - 5.0 g/dL   AST 23 15 - 41 U/L   ALT 18 0 - 44 U/L   Alkaline Phosphatase 74 38 - 126 U/L   Total Bilirubin 1.2 0.3 - 1.2 mg/dL   GFR calc non Af Amer 17 (L) >60 mL/min   GFR calc Af Amer 20 (L) >60 mL/min   Anion gap 15 5 - 15    Comment: Performed at [facility], [address] [place], [place] [address]  CBC with Differential     Status: Abnormal   Collection Time: 05/04/19  5:25 PM  Result Value Ref Range   WBC 18.4 (H) 4.0 - 10.5 K/uL   RBC 5.15 4.22 - 5.81 MIL/uL   Hemoglobin 14.8 13.0 - 17.0 g/dL   HCT 45.4 39.0 - 52.0 %   MCV 88.2 80.0 - 100.0 fL   MCH 28.7 26.0 - 34.0 pg   MCHC 32.6 30.0 - 36.0 g/dL   RDW 14.1 11.5 - 15.5 %   Platelets 214 150 - 400 K/uL   nRBC 0.0 0.0 - 0.2 %   Neutrophils Relative % 92 %   Neutro Abs 16.8 (H) 1.7 - 7.7 K/uL   Lymphocytes Relative 2 %   Lymphs Abs 0.3 (L) 0.7 - 4.0 K/uL   Monocytes Relative 5 %   Monocytes Absolute 1.0 0.1 - 1.0 K/uL   Eosinophils Relative 0 %   Eosinophils Absolute 0.0 0.0 - 0.5 K/uL   Basophils Relative 0 %   Basophils Absolute 0.0 0.0 - 0.1 K/uL   Immature Granulocytes 1 %   Abs Immature Granulocytes 0.17 (H) 0.00 - 0.07 K/uL    Comment: Performed at [facility], [address] [place], [place] [address]  Urinalysis, Complete w Microscopic     Status: Abnormal   Collection Time: 05/04/19  5:25 PM  Result Value Ref Range   Color, Urine AMBER (A) YELLOW    Comment: BIOCHEMICALS MAY BE AFFECTED BY COLOR   APPearance TURBID (A) CLEAR   Specific Gravity, Urine  1.018 1.005 - 1.030   pH 5.0 5.0 - 8.0    Glucose, UA >=500 (A) NEGATIVE mg/dL   Hgb urine dipstick LARGE (A) NEGATIVE   Bilirubin Urine NEGATIVE NEGATIVE   Ketones, ur NEGATIVE NEGATIVE mg/dL   Protein, ur 100 (A) NEGATIVE mg/dL   Nitrite POSITIVE (A) NEGATIVE   Leukocytes,Ua SMALL (A) NEGATIVE   RBC / HPF >50 (H) 0 - 5 RBC/hpf   WBC, UA >50 (H) 0 - 5 WBC/hpf   Bacteria, UA MANY (A) NONE SEEN   Squamous Epithelial / LPF 21-50 0 - 5   WBC Clumps PRESENT    Mucus PRESENT    Budding Yeast PRESENT     Comment: Performed at [facility], [address] [place], [place] [address]  Protime-INR     Status: Abnormal   Collection Time: 05/04/19  5:25 PM  Result Value Ref Range   Prothrombin Time 23.5 (H) 11.4 - 15.2 seconds   INR 2.1 (H) 0.8 - 1.2    Comment: (NOTE) INR goal varies based on device and disease states. Performed at [facility], [address] [place], [place] [address]   Lactic acid, plasma     Status: Abnormal   Collection Time: 05/04/19  5:25 PM  Result Value Ref Range   Lactic Acid, Venous 5.5 (HH) 0.5 - 1.9 mmol/L    Comment: CRITICAL RESULT CALLED TO, READ BACK BY AND VERIFIED WITH MEGAN JONES AT 1808 05/04/2019.PMF Performed at [facility], [address] [place], [place] [address]   SARS Coronavirus 2 [facility] order, Performed in [facility] hospital lab) Nasopharyngeal Nasopharyngeal Swab     Status: None   Collection Time: 05/04/19  6:50 PM   Specimen: Nasopharyngeal Swab  Result Value Ref Range   SARS Coronavirus 2 NEGATIVE NEGATIVE    Comment: (NOTE) If result is NEGATIVE SARS-CoV-2 target nucleic acids are NOT DETECTED. The SARS-CoV-2 RNA is generally detectable in upper and lower  respiratory specimens during the acute phase of infection. The lowest  concentration of SARS-CoV-2 viral copies this assay can detect is 250  copies / mL. A negative result does not preclude SARS-CoV-2 infection  and should not be used as the sole basis for treatment or other   patient management decisions.  A negative result may occur with  improper specimen collection / handling, submission of specimen other  than nasopharyngeal swab, presence of viral mutation(s) within the  areas targeted by this assay, and inadequate number of viral copies  (<250 copies / mL). A negative result must be combined with clinical  observations, patient history, and epidemiological information. If result is POSITIVE SARS-CoV-2 target nucleic acids are DETECTED. The SARS-CoV-2 RNA is generally detectable in upper and lower  respiratory specimens dur ing the acute phase of infection.  Positive  results are indicative of active infection with SARS-CoV-2.  Clinical  correlation with patient history and other diagnostic information is  necessary to determine patient infection status.  Positive results do  not rule out bacterial infection or co-infection with other viruses. If result is PRESUMPTIVE POSTIVE SARS-CoV-2 nucleic acids MAY BE PRESENT.   A presumptive positive result was obtained on the submitted specimen  and confirmed on repeat testing.  While 2019 novel coronavirus  (SARS-CoV-2) nucleic acids may be present in the submitted sample  additional confirmatory testing may be necessary for epidemiological  and / or clinical management purposes  to differentiate between  SARS-CoV-2 and other Sarbecovirus currently known to infect humans.  If clinically indicated additional testing with an alternate test  methodology [phone]) is advised. The SARS-CoV-2 RNA is generally  detectable in upper and lower respiratory sp ecimens during the acute  phase of infection. The expected result is Negative. Fact Sheet for Patients:  StrictlyIdeas.no Fact Sheet for Healthcare Providers: BankingDealers.co.za This test is not yet approved or cleared by the [place] FDA and has been authorized for detection and/or diagnosis of SARS-CoV-2  by FDA under an Emergency Use Authorization (EUA).  This EUA will remain in effect (meaning this test can be used) for the duration of the COVID-19 declaration under Section 564(b)(1) of the Act, 21 U.S.C. section 360bbb-3(b)(1), unless the authorization is terminated or revoked sooner. Performed at [facility], [address] [place], [place] [address]   Lactic acid, plasma     Status: Abnormal   Collection Time: 05/04/19  7:23 PM  Result Value Ref Range   Lactic Acid, Venous 4.1 (HH) 0.5 - 1.9 mmol/L    Comment: CRITICAL RESULT CALLED TO, READ BACK BY AND VERIFIED WITH GRACIE [facility] @1955  05/04/19 MJU Performed at [facility], [address], [place], [place] [address]    Dg Chest Port 1 View  Result Date: 05/04/2019 CLINICAL DATA:  Blood in catheterization bag. EXAM: PORTABLE CHEST 1 VIEW COMPARISON:  March 18, 2019 FINDINGS: Stable pacemaker. Stable cardiomegaly. The hila, mediastinum, lungs, and pleura are otherwise unremarkable. IMPRESSION: No acute abnormalities. Electronically Signed   By: Dorise Bullion III M.D   On: 05/04/2019 18:23    Pending Labs Unresulted Labs (From admission, onward)    Start     Ordered   05/05/19 0500  Protime-INR  Tomorrow morning,   STAT     05/04/19 1932   05/05/19 9323  Basic metabolic panel  Tomorrow morning,   STAT     05/04/19 1932   05/05/19 0500  CBC  Tomorrow morning,   STAT     05/04/19 1932   05/05/19 0500  APTT  Tomorrow morning,   STAT     05/04/19 1932   05/04/19 2134  Hemoglobin A1c  Once,   STAT    Comments: To assess prior glycemic control    05/04/19 2133   05/04/19 1730  Urine culture  Once,   STAT     05/04/19 1730          Vitals/Pain Today's Vitals   05/04/19 1930 05/04/19 2000 05/04/19 2030 05/04/19 2100  BP:  132/60 (!) 126/59 (!) 135/59  Pulse:  60 (!) 59 (!) 58  Resp:      Temp:      TempSrc:      SpO2:   92%   Weight:      Height:      PainSc: Asleep       Isolation  Precautions No active isolations  Medications Medications  acetaminophen (TYLENOL) tablet 650 mg (has no administration in time range)  oxyCODONE (Oxy IR/ROXICODONE) immediate release tablet 5 mg (has no administration in time range)  atorvastatin (LIPITOR) tablet 40 mg (has no administration in time range)  nitroGLYCERIN (NITROSTAT) SL tablet 0.4 mg (has no administration in time range)  sertraline (ZOLOFT) tablet 25 mg (has no administration in time range)  traZODone (DESYREL) tablet 50 mg (has no administration in time range)  vitamin B-12 (CYANOCOBALAMIN) tablet 1,000 mcg (has no administration in time range)  tamsulosin (FLOMAX) capsule 0.4 mg (has no administration in time range)  ketotifen (ZADITOR) 0.025 % ophthalmic solution 1  drop (has no administration in time range)  NEOMYCIN-POLYMYXIN-HYDROCORTISONE (CORTISPORIN) OTIC (EAR) solution 3 drop (has no administration in time range)  cefTRIAXone (ROCEPHIN) 1 g in sodium chloride 0.9 % 100 mL IVPB (has no administration in time range)  warfarin (COUMADIN) tablet 2.5 mg (has no administration in time range)  warfarin (COUMADIN) tablet 3 mg (has no administration in time range)  Warfarin - Physician Dosing Inpatient (has no administration in time range)  famotidine (PEPCID) tablet 20 mg (has no administration in time range)  insulin aspart (novoLOG) injection 0-5 Units (has no administration in time range)  insulin aspart (novoLOG) injection 0-9 Units (has no administration in time range)  cefTRIAXone (ROCEPHIN) 1 g in sodium chloride 0.9 % 100 mL IVPB (0 g Intravenous Stopped 05/04/19 1852)  sodium chloride 0.9 % bolus 1,000 mL (0 mLs Intravenous Stopped 05/04/19 1929)  sodium chloride 0.9 % bolus 500 mL (0 mLs Intravenous Stopped 05/04/19 2007)  sodium chloride 0.9 % bolus 500 mL (0 mLs Intravenous Stopped 05/04/19 2126)  sodium chloride 0.9 % bolus 500 mL (0 mLs Intravenous Stopped 05/04/19 2126)    Mobility non-ambulatory High fall risk    Focused Assessments Renal Assessment Handoff:  Hemodialysis Schedule:  Last Hemodialysis date and time:    Restricted appendage:       R Recommendations: See Admitting Provider Note  Report given to:   Additional Notes:

## 2019-05-04 NOTE — ED Notes (Addendum)
Water given to pt at this time, family member assisting with administration. Okay per NP

## 2019-05-04 NOTE — ED Provider Notes (Signed)
[facility] Emergency Department Provider Note  ____________________________________________   I have reviewed the triage vital signs and the nursing notes. Where available I have reviewed prior notes and, if possible and indicated, outside hospital notes.   Patient seen and evaluated during the coronavirus epidemic during a time with low staffing  Patient seen for the symptoms described in the history of present illness. She was evaluated in the context of the global COVID-19 pandemic, which necessitated consideration that the patient might be at risk for infection with the SARS-CoV-2 virus that causes COVID-19. Institutional protocols and algorithms that pertain to the evaluation of patients at risk for COVID-19 are in a state of rapid change based on information released by regulatory bodies including the CDC and federal and state organizations. These policies and algorithms were followed during the patient's care in the ED.    HISTORY  Chief Complaint Altered Mental Status    HPI Michael Bush is a 78 y.o. male  History of CHF, chronic kidney disease colon cancer, status post: Colon resection remotely, hypothyroidism, arthritis, EF is 55% on last echo last year but he has apparently some element of diastolic failure per notes, he has history of Coumadin use and multiple other medical problems including COPD, hernia family he was doing pretty good until yesterday when he started having fevers and vomiting.  No complaints of pain.  No diarrhea.  No cough or shortness of breath.  No exposure to coronavirus.  Only has been out of his house to go to the doctor in the last 2 weeks.  Wife is concerned that this is another UTI.  Patient is DNR/DNI and wife for Mrs. she does accept antibiotics include however, in addition, patient is somewhat confused over baseline     Past Medical History:  Diagnosis Date  . Arthritis   . CHF (congestive heart failure) ([place])   .  Chronic kidney disease    chronic kidney disease  . Colon cancer ([place]) 2003   Hx Partial colon resection, chemo + rad tx's.  Marland Kitchen COPD (chronic obstructive pulmonary disease) ([place])   . Coronary artery disease   . Diabetes mellitus without complication ([place])   . Dysrhythmia    atrial fib., bradycardia  . GERD (gastroesophageal reflux disease)   . Hypertension   . Hypothyroidism   . Long term (current) use of anticoagulants   . Mixed hearing loss, unilateral    03/07/14  . Myocardial infarction ([place])   . Presence of permanent cardiac pacemaker    [employer] DDDR [phone]  . Pseudophakia of both eyes   . Sick sinus syndrome ([place]) 04/26/2014  . Stroke Michael Bush)     Patient Active Problem List   Diagnosis Date Noted  . Hemiplegia following CVA (cerebrovascular accident) ([place]) 11/11/2018  . Acute cystitis without hematuria 07/27/2018  . Left-sided weakness 07/26/2018  . Oropharyngeal dysphagia 07/12/2018  . Incomplete bladder emptying 06/19/2018  . Unstable angina ([place]) 03/07/2018  . 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 Personal history of kidney stones 10/13/2017  . Hydronephrosis 10/13/2017  . 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 Diplopia 07/27/2017  . 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 Humerus lesion, left 06/01/2017  . Hematuria, gross 12/06/2016  . Parotid mass 11/26/2015  . Nausea and vomiting 11/01/2015  . Nodule of parotid gland 11/01/2015  . Pancreatic mass 11/01/2015  . Stage IV carcinoma of colon ([place]) 11/01/2015  . Cerebral infarction ([place]) 07/23/2015  . Cerebrovascular accident (CVA) due to vascular  occlusion ([place]) 07/23/2015  . Chronic obstructive pulmonary disease ([place]) 03/13/2015  . Thrombus of left atrial appendage 02/10/2015  . Long term (current) use of anticoagulants 12/09/2014  . Kidney stone 11/20/2014  . Acute renal failure (ARF) ([place]) 10/09/2014  . ARF (acute renal failure) ([place]) 10/09/2014  . Bladder mass 10/09/2014  . Cardiac  pacemaker in situ 04/26/2014  . Bradycardia 04/25/2014  . Chronic suppurative otitis media 03/07/2014  . Mixed conductive and sensorineural hearing loss, unspecified 03/07/2014  . CAD (coronary artery disease) 01/15/2014  . Essential hypertension 01/15/2014  . 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 Neuropathy 01/15/2014  . Atrial fibrillation ([place]) 12/04/2013    Past Surgical History:  Procedure Laterality Date  . Cardioversion External    . Cataract cortical ssenile    . COLON SURGERY    . COLON SURGERY    . COLONOSCOPY WITH PROPOFOL N/A 01/27/2018   Procedure: COLONOSCOPY WITH PROPOFOL;  Surgeon: Lollie Sails, MD;  Location: [facility] ENDOSCOPY;  Service: Endoscopy;  Laterality: N/A;  . CORONARY ANGIOPLASTY    . Insertion dual chamber pacemaker generator    . IR CATHETER TUBE CHANGE  03/16/2019  . IR CATHETER TUBE CHANGE  04/13/2019    Prior to Admission medications   Medication Sig Start Date End Date Taking? Authorizing Provider  acetaminophen (TYLENOL) 325 MG tablet Take 650 mg by mouth every 6 (six) hours as needed for mild pain or moderate pain.  10/11/14   [provider]  albuterol (PROVENTIL HFA;VENTOLIN HFA) 108 (90 Base) MCG/ACT inhaler Inhale 2 puffs into the lungs every 6 (six) hours as needed. 10/15/18   Schaevitz, Randall An, MD  aspirin 81 MG chewable tablet Chew 81 mg by mouth every morning. 07/02/15   [provider]  atorvastatin (LIPITOR) 40 MG tablet TK 1 T PO D 05/02/18   [provider]  B Complex Vitamins (VITAMIN B COMPLEX PO) Take 2 tablets by mouth daily.    [provider]  Calcium Carb-Ergocalciferol 500-200 MG-UNIT TABS Take by mouth.    [provider]  ciprofloxacin (CIPRO) 250 MG tablet Take 1 tablet (250 mg total) by mouth 2 (two) times daily. 03/29/19   Zara Council A, PA-C  citalopram (CELEXA) 20 MG tablet Take 20 mg by mouth daily. 03/14/17 03/14/18  [provider]  doxycycline (VIBRAMYCIN) 100 MG capsule  Take 1 capsule (100 mg total) by mouth 2 (two) times daily. 10/15/18   Schaevitz, Randall An, MD  famotidine (PEPCID) 20 MG tablet Take 20 mg by mouth 2 (two) times daily.  10/14/17 10/14/18  [provider]  glipiZIDE (GLUCOTROL) 5 MG tablet Take 5 mg by mouth 2 (two) times daily.  12/08/17   [provider]  insulin aspart (NOVOLOG) 100 UNIT/ML FlexPen Take Insulin as directed by sliding scale 04/27/18   [provider]  ketotifen (ZADITOR) 0.025 % ophthalmic solution INT 1 GTT IN OU BID FOR ALLERGIES OR ITCHING 11/29/17   [provider]  lactulose (CEPHULAC) 20 g packet Take 1 packet (20 g total) by mouth daily as needed (constipation). 12/22/17   Schaevitz, Randall An, MD  NEOMYCIN-POLYMYXIN-HYDROCORTISONE (CORTISPORIN) 1 % SOLN OTIC solution  04/27/18   [provider]  nitroGLYCERIN (NITROSTAT) 0.4 MG SL tablet Place 1 tablet (0.4 mg total) under the tongue every 5 (five) minutes as needed for chest pain. 03/08/18   Bettey Costa, MD  Omega-3 Fatty Acids (KP FISH OIL) 1200 MG CAPS Take by mouth.    [provider]  sertraline (  ZOLOFT) 25 MG tablet Take by mouth. 08/10/18   [provider]  sulfamethoxazole-trimethoprim (BACTRIM DS) 800-160 MG tablet Take 1 tablet by mouth every 12 (twelve) hours. 02/01/19   Stoioff, Ronda Fairly, MD  tamsulosin (FLOMAX) 0.4 MG CAPS capsule Take 1 capsule (0.4 mg total) by mouth daily. 02/15/19   Stoioff, Ronda Fairly, MD  tiZANidine (ZANAFLEX) 2 MG tablet  11/25/16   [provider]  traMADol (ULTRAM) 50 MG tablet Take 1 tablet (50 mg total) by mouth every 6 (six) hours as needed. 10/12/18   Caryn Section Linden Dolin, PA-C  traZODone (DESYREL) 50 MG tablet Take by mouth. 08/10/18   [provider]  vitamin B-12 (CYANOCOBALAMIN) 1000 MCG tablet Take 1,000 mcg by mouth daily.     [provider]  warfarin (COUMADIN) 3 MG tablet Take 2.5 mg by mouth daily at 6 PM. Take 3MG  by mouth daily except on Friday  take 5MG  by mouth    [provider]    Allergies Vancomycin, Dabigatran, Pollen extract, and Pradaxa [dabigatran etexilate mesylate]  Family History  Problem Relation Age of Onset  . Cancer Father     Social History Social History   Tobacco Use  . Smoking status: Former Smoker    Packs/day: 2.00    Years: 30.00    Pack years: 60.00    Types: Cigarettes  . Smokeless tobacco: Never Used  Substance Use Topics  . Alcohol use: No  . Drug use: No    Review of Systems Constitutional: + fever/chills Eyes: No visual changes. ENT: No sore throat. No stiff neck no neck pain Cardiovascular: Denies chest pain. Respiratory: Denies shortness of breath. Gastrointestinal:   + vomiting.  No diarrhea.  No constipation. Genitourinary: Negative for dysuria. Musculoskeletal: Negative lower extremity swelling Skin: Negative for rash. Neurological: Negative for severe headaches, focal weakness or numbness.   ____________________________________________   PHYSICAL EXAM:  VITAL SIGNS: ED Triage Vitals  Enc Vitals Group     BP 05/04/19 1730 (!) 142/60     Pulse Rate 05/04/19 1730 (!) 59     Resp 05/04/19 1730 (!) 24     Temp 05/04/19 1730 98.9 F (37.2 C)     Temp Source 05/04/19 1730 Oral     SpO2 05/04/19 1708 95 %     Weight 05/04/19 1721 176 lb (79.8 kg)     Height 05/04/19 1721 5\' 10"  (1.778 m)     Head Circumference --      Peak Flow --      Pain Score --      Pain Loc --      Pain Edu? --      Excl. in [place]? --     Constitutional: Very ill-appearing gentleman at baseline, in no acute discomfort, resting comfortably in the bed.  When I asked him if he has pain he says now Eyes: Conjunctivae are normal Head: Atraumatic HEENT: No congestion/rhinnorhea. Mucous membranes are slightly dry.  Oropharynx non-erythematous Neck:   Nontender with no meningismus, no masses, no stridor Cardiovascular: Normal rate, regular rhythm. Grossly normal heart sounds.  Good  peripheral circulation. Respiratory: Normal respiratory effort.  No retractions. Lungs CTAB. Abdominal: Soft and nontender. No distention. No guarding no rebound prepubic catheter in place Back:  There is no focal tenderness or step off.  there is no midline tenderness there are no lesions noted. there is no CVA tenderness Musculoskeletal: No lower extremity tenderness, no upper extremity tenderness. No joint effusions, no DVT signs strong  distal pulses Neurologic: Exam limited Skin:  Skin is warm, dry and intact. No rash noted. Psychiatric: Mood and affect are normal. Speech and behavior are normal.  ____________________________________________   LABS (all labs ordered are listed, but only abnormal results are displayed)  Labs Reviewed  COMPREHENSIVE METABOLIC PANEL - Abnormal; Notable for the following components:      Result Value   Glucose, Bld 334 (*)    BUN 35 (*)    Creatinine, Ser 3.30 (*)    GFR calc non Af Amer 17 (*)    GFR calc Af Amer 20 (*)    All other components within normal limits  CBC WITH DIFFERENTIAL/PLATELET - Abnormal; Notable for the following components:   WBC 18.4 (*)    Neutro Abs 16.8 (*)    Lymphs Abs 0.3 (*)    Abs Immature Granulocytes 0.17 (*)    All other components within normal limits  URINALYSIS, COMPLETE (UACMP) WITH MICROSCOPIC - Abnormal; Notable for the following components:   Color, Urine AMBER (*)    APPearance TURBID (*)    Glucose, UA >=500 (*)    Hgb urine dipstick LARGE (*)    Protein, ur 100 (*)    Nitrite POSITIVE (*)    Leukocytes,Ua SMALL (*)    RBC / HPF >50 (*)    WBC, UA >50 (*)    Bacteria, UA MANY (*)    All other components within normal limits  PROTIME-INR - Abnormal; Notable for the following components:   Prothrombin Time 23.5 (*)    INR 2.1 (*)    All other components within normal limits  LACTIC ACID, PLASMA - Abnormal; Notable for the following components:   Lactic Acid, Venous 5.5 (*)    All other components  within normal limits  URINE CULTURE  LACTIC ACID, PLASMA    Pertinent labs  results that were available during my care of the patient were reviewed by me and considered in my medical decision making (see chart for details). ____________________________________________  EKG  I personally interpreted any EKGs ordered by me or triage Paced rhythm rate 60 ____________________________________________  RADIOLOGY  Pertinent labs & imaging results that were available during my care of the patient were reviewed by me and considered in my medical decision making (see chart for details). If possible, patient and/or family made aware of any abnormal findings.  No results found. ____________________________________________    PROCEDURES  Procedure(s) performed: None  Procedures  Critical Care performed: CRITICAL CARE Performed by: Schuyler Amor   Total critical care time: 45 minutes  Critical care time was exclusive of separately billable procedures and treating other patients.  Critical care was necessary to treat or prevent imminent or life-threatening deterioration.  Critical care was time spent personally by me on the following activities: development of treatment plan with patient and/or surrogate as well as nursing, discussions with consultants, evaluation of patient's response to treatment, examination of patient, obtaining history from patient or surrogate, ordering and performing treatments and interventions, ordering and review of laboratory studies, ordering and review of radiographic studies, pulse oximetry and re-evaluation of patient's condition.   ____________________________________________   INITIAL IMPRESSION / ASSESSMENT AND PLAN / ED COURSE  Pertinent labs & imaging results that were available during my care of the patient were reviewed by me and considered in my medical decision making (see chart for details).   Patient here with fever confusion and vomiting,  urine looks frankly purulent, it is turbid and nitrite positive leukocyte  positive" this is most likely the source of his infection lactic is elevated, I cannot intubate this patient however if we give him too much fluid and his pressures are holding so we will give him IV fluid and a thoughtful manner pending x-ray, lactic is noted.    ____________________________________________   FINAL CLINICAL IMPRESSION(S) / ED DIAGNOSES  Final diagnoses:  Fever      This chart was dictated using voice recognition software.  Despite best efforts to proofread,  errors can occur which can change meaning.      Schuyler Amor, MD 05/04/19 [phone]

## 2019-05-05 LAB — CBC
HCT: 38.6 % — ABNORMAL LOW (ref 39.0–52.0)
Hemoglobin: 12.5 g/dL — ABNORMAL LOW (ref 13.0–17.0)
MCH: 28.7 pg (ref 26.0–34.0)
MCHC: 32.4 g/dL (ref 30.0–36.0)
MCV: 88.5 fL (ref 80.0–100.0)
Platelets: 163 10*3/uL (ref 150–400)
RBC: 4.36 MIL/uL (ref 4.22–5.81)
RDW: 14.1 % (ref 11.5–15.5)
WBC: 14 10*3/uL — ABNORMAL HIGH (ref 4.0–10.5)
nRBC: 0 % (ref 0.0–0.2)

## 2019-05-05 LAB — LACTIC ACID, PLASMA
Lactic Acid, Venous: 2.4 mmol/L (ref 0.5–1.9)
Lactic Acid, Venous: 2.6 mmol/L (ref 0.5–1.9)

## 2019-05-05 LAB — BASIC METABOLIC PANEL
Anion gap: 10 (ref 5–15)
Anion gap: 12 (ref 5–15)
BUN: 39 mg/dL — ABNORMAL HIGH (ref 8–23)
BUN: 50 mg/dL — ABNORMAL HIGH (ref 8–23)
CO2: 21 mmol/L — ABNORMAL LOW (ref 22–32)
CO2: 21 mmol/L — ABNORMAL LOW (ref 22–32)
Calcium: 8.1 mg/dL — ABNORMAL LOW (ref 8.9–10.3)
Calcium: 8.4 mg/dL — ABNORMAL LOW (ref 8.9–10.3)
Chloride: 108 mmol/L (ref 98–111)
Chloride: 106 mmol/L (ref 98–111)
Creatinine, Ser: 3.81 mg/dL — ABNORMAL HIGH (ref 0.61–1.24)
Creatinine, Ser: 4.81 mg/dL — ABNORMAL HIGH (ref 0.61–1.24)
GFR calc Af Amer: 16 mL/min — ABNORMAL LOW (ref >60)
GFR calc Af Amer: 12 mL/min — ABNORMAL LOW (ref >60)
GFR calc non Af Amer: 14 mL/min — ABNORMAL LOW (ref >60)
GFR calc non Af Amer: 11 mL/min — ABNORMAL LOW (ref >60)
Glucose, Bld: 288 mg/dL — ABNORMAL HIGH (ref 70–99)
Glucose, Bld: 262 mg/dL — ABNORMAL HIGH (ref 70–99)
Potassium: 4.9 mmol/L (ref 3.5–5.1)
Potassium: 5.3 mmol/L — ABNORMAL HIGH (ref 3.5–5.1)
Sodium: 139 mmol/L (ref 135–145)
Sodium: 139 mmol/L (ref 135–145)

## 2019-05-05 LAB — APTT: aPTT: 50 seconds — ABNORMAL HIGH (ref 24–36)

## 2019-05-05 LAB — HEMOGLOBIN A1C
Hgb A1c MFr Bld: 7.6 % — ABNORMAL HIGH (ref 4.8–5.6)
Mean Plasma Glucose: 171.42 mg/dL

## 2019-05-05 LAB — GLUCOSE, CAPILLARY
Glucose-Capillary: 222 mg/dL — ABNORMAL HIGH (ref 70–99)
Glucose-Capillary: 221 mg/dL — ABNORMAL HIGH (ref 70–99)
Glucose-Capillary: 182 mg/dL — ABNORMAL HIGH (ref 70–99)
Glucose-Capillary: 189 mg/dL — ABNORMAL HIGH (ref 70–99)

## 2019-05-05 LAB — PROTIME-INR
INR: 2.9 — ABNORMAL HIGH (ref 0.8–1.2)
Prothrombin Time: 29.8 seconds — ABNORMAL HIGH (ref 11.4–15.2)

## 2019-05-05 MED ORDER — WARFARIN SODIUM 2 MG PO TABS
2.0000 mg | ORAL_TABLET | Freq: Once | ORAL | Status: AC
  Administered 2019-05-05: 2 mg via ORAL
  Filled 2019-05-05: qty 1

## 2019-05-05 MED ORDER — WARFARIN - PHARMACIST DOSING INPATIENT
Freq: Every day | Status: DC
  Administered 2019-05-05: 18:00:00

## 2019-05-05 MED ORDER — SODIUM CHLORIDE 0.9 % IV SOLN
INTRAVENOUS | Status: DC
  Administered 2019-05-05 – 2019-05-06 (×3): via INTRAVENOUS

## 2019-05-05 MED ORDER — SODIUM ZIRCONIUM CYCLOSILICATE 5 G PO PACK
5.0000 g | PACK | Freq: Every day | ORAL | Status: DC
  Administered 2019-05-05: 5 g via ORAL
  Filled 2019-05-05 (×2): qty 1

## 2019-05-05 NOTE — Progress Notes (Signed)
CRITICAL VALUE ALERT  Critical Value:  Lactic acid 2.6  Date & Time Notied:  11:45 am  Provider Notified: MD Bridgett Larsson   Orders Received/Actions taken: no new orders

## 2019-05-05 NOTE — Progress Notes (Signed)
Temp 100.9 PRN tylenol given. MD made aware

## 2019-05-05 NOTE — Progress Notes (Signed)
Urine output 0 mls throughout the shift. Bladder scan showed 0 mls. MD Bridgett Larsson  made aware.   Wynema Birch, RN

## 2019-05-05 NOTE — Progress Notes (Addendum)
[place] [facility] [place] NAME: Michael Bush    MR#:  [id number]  DATE OF BIRTH:  Aug 22, 1941  SUBJECTIVE:  CHIEF COMPLAINT:   Chief Complaint  Patient presents with  . Altered Mental Status   The patient is noncommunicative, still oliguria. REVIEW OF SYSTEMS:  Review of Systems  Unable to perform ROS: Mental status change    DRUG ALLERGIES:   Allergies  Allergen Reactions  . Vancomycin Swelling    Facial swelling  . Dabigatran Other (See Comments)    Bleeding complications Bleeding complications   . Pollen Extract   . Pradaxa [Dabigatran Etexilate Mesylate]    VITALS:  Blood pressure (!) 143/61, pulse 61, temperature 98.5 F (36.9 C), resp. rate (!) 26, height 5\' 9"  (1.753 m), weight 80.3 kg, SpO2 96 %. PHYSICAL EXAMINATION:  Physical Exam Constitutional:      General: He is not in acute distress. HENT:     Head: Normocephalic.  Eyes:     General: No scleral icterus.    Conjunctiva/sclera: Conjunctivae normal.     Pupils: Pupils are equal, round, and reactive to light.  Neck:     Musculoskeletal: Neck supple.     Vascular: No JVD.     Trachea: No tracheal deviation.  Cardiovascular:     Rate and Rhythm: Normal rate and regular rhythm.     Heart sounds: Normal heart sounds. No murmur. No gallop.   Pulmonary:     Effort: Pulmonary effort is normal. No respiratory distress.     Breath sounds: Normal breath sounds. No wheezing or rales.  Abdominal:     General: Bowel sounds are normal. There is no distension.     Palpations: Abdomen is soft.     Tenderness: There is no abdominal tenderness. There is no rebound.  Musculoskeletal:     Right lower leg: No edema.     Left lower leg: No edema.  Skin:    Findings: No erythema or rash.  Neurological:     Comments: Unable to exam  Psychiatric:     Comments: noncommunicative    LABORATORY PANEL:  Male CBC Recent Labs  Lab 05/05/19 0102  WBC 14.0*  HGB 12.5*   HCT 38.6*  PLT 163   ------------------------------------------------------------------------------------------------------------------ Chemistries  Recent Labs  Lab 05/04/19 1725  05/05/19 1055  NA 140   < > 139  K 4.6   < > 5.3*  CL 102   < > 106  CO2 23   < > 21*  GLUCOSE 334*   < > 262*  BUN 35*   < > 50*  CREATININE 3.30*   < > 4.81*  CALCIUM 9.2   < > 8.4*  AST 23  --   --   ALT 18  --   --   ALKPHOS 74  --   --   BILITOT 1.2  --   --    < > = values in this interval not displayed.   RADIOLOGY:  US Renal  Result Date: 05/04/2019 CLINICAL DATA:  Acute renal injury EXAM: RENAL / URINARY TRACT ULTRASOUND COMPLETE COMPARISON:  03/18/2019 CT of the abdomen and pelvis. FINDINGS: Right Kidney: Renal measurements: 15.2 x 8.0 x 7.5 cm. = volume: 475 mL. Mild hydronephrosis is noted. Previously seen renal stone is not well appreciated and may have migrated into the right ureter. 3 cm cyst is noted similar to that seen on prior CT examination. Left Kidney: Renal measurements: 13.6 x  7.3 x 8.0 cm = volume: 417 mL. Severe longstanding hydronephrosis is noted with cortical thinning similar to that seen on prior CT examination. 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 Mild hydronephrosis on the right. Previously seen renal calculus is not well appreciated and may have migrated into the ureter. CT urogram may be helpful. Right renal cyst stable from the prior CT. Electronically Signed   By: Inez Catalina M.D.   On: 05/04/2019 22:14   Dg Chest Port 1 View  Result Date: 05/04/2019 CLINICAL DATA:  Blood in catheterization bag. EXAM: PORTABLE CHEST 1 VIEW COMPARISON:  March 18, 2019 FINDINGS: Stable pacemaker. Stable cardiomegaly. The hila, mediastinum, lungs, and pleura are otherwise unremarkable. IMPRESSION: No acute abnormalities. Electronically Signed   By: Dorise Bullion III M.D   On: 05/04/2019 18:23    ASSESSMENT AND PLAN:   78 y.o. 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  1. Sepsis -likely secondary to UTI.  Patient presenting with fever decreased urine output, confusion with elevated lactic acid 5.5 and leukocytosis meeting sepsis criteria.  Treated with IV fluid bolus per sepsis protocol -Chest x-ray reviewed shows no acute abnormality -Blood cultures pending -UA positive for UTI -Urine cultures pending Continue ceftriaxone.  2. 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 -Likely prerenal, worsening -Holding nephrotoxins Continue IV fluids Renal ultrasound did not report obstruction.  The patient's wife does not want hemodialysis for the patient. Nephrology consult.  Hyperkalemia. Due to ARF on CKD.  Lokelma.  Lactic acidosis. Continue above treatment.  Acute metabolic encephalopathy due to above.  Aspiration precaution.  3. Atrial fibrillation -on Coumadin  4. Diabetes mellitus type 2 - Hold glipizide in the setting of AKI Continue sliding scale  5. Hyperlipidemia -Atorvastatin 40 mg q. 1800  6. History of CVA with residual left-sided hemiplegia - On Coumadin for thrombosis -PT/INR supratherapeutic. Pharmacy to dose  7. Hypertension - Stable  8. Urinary retention-managed by SPT with planned exchange on 06/01/2019 - Monitor for gross hematuria - Urology consult  His wife understand the patient's condition is worsening.  The patient was in hospice care at home.  She prefers hospice care at home on discharge or if patient get worse. All the records are  reviewed and case discussed with Care Management/Social Worker. Management plans discussed with the patient, his wife and they are in agreement.  CODE STATUS: DNR  TOTAL TIME TAKING CARE OF THIS PATIENT: 36 minutes.   More than 50% of the time was spent in counseling/coordination of care: YES  POSSIBLE D/C IN ? DAYS, DEPENDING ON CLINICAL CONDITION.   Demetrios Loll M.D on 05/05/2019 at 3:47 PM  Between 7am to 6pm - Pager - [phone]  After 6pm go to [email or web address] - [facility]

## 2019-05-05 NOTE — Progress Notes (Signed)
ANTICOAGULATION CONSULT NOTE -   Pharmacy Consult for Warfarin Indication: atrial fibrillation (home medication)  Allergies  Allergen Reactions  . Vancomycin Swelling    Facial swelling  . Dabigatran Other (See Comments)    Bleeding complications Bleeding complications   . Pollen Extract   . Pradaxa [Dabigatran Etexilate Mesylate]    Patient Measurements: Height: 5\' 9"  (175.3 cm) Weight: 177 lb 0.5 oz (80.3 kg) IBW/kg (Calculated) : 70.7  Vital Signs: Temp: 100.9 F (38.3 C) (08/08 1143) Temp Source: Oral (08/08 0433) BP: 143/61 (08/08 1143) Pulse Rate: 61 (08/08 1143)  Labs: Recent Labs    05/04/19 1725 05/05/19 0102 05/05/19 1055  HGB 14.8 12.5*  --   HCT 45.4 38.6*  --   PLT 214 163  --   APTT  --  50*  --   LABPROT 23.5*  --  29.8*  INR 2.1*  --  2.9*  CREATININE 3.30* 3.81* 4.81*   Estimated Creatinine Clearance: 12.5 mL/min (A) (by C-G formula based on SCr of 4.81 mg/dL (H)).  Medical History: Past Medical History:  Diagnosis Date  . Arthritis   . CHF (congestive heart failure) ([place])   . Chronic kidney disease    chronic kidney disease  . Colon cancer ([place]) 2003   Hx Partial colon resection, chemo + rad tx's.  Marland Kitchen COPD (chronic obstructive pulmonary disease) ([place])   . Coronary artery disease   . Diabetes mellitus without complication ([place])   . Dysrhythmia    atrial fib., bradycardia  . GERD (gastroesophageal reflux disease)   . Hypertension   . Hypothyroidism   . Long term (current) use of anticoagulants   . Mixed hearing loss, unilateral    03/07/14  . Myocardial infarction ([place])   . Presence of permanent cardiac pacemaker    [employer] DDDR [phone]  . Pseudophakia of both eyes   . Sick sinus syndrome ([place]) 04/26/2014  . Stroke [facility])    Medications:  Medications Prior to Admission  Medication Sig Dispense Refill Last Dose  . atorvastatin (LIPITOR) 40 MG tablet Take 40 mg by mouth daily at 6 PM.   3 Past Week at Unknown time   . B Complex Vitamins (VITAMIN B COMPLEX PO) Take 2 tablets by mouth daily.   Past Week at Unknown time  . Calcium Carb-Ergocalciferol 500-200 MG-UNIT TABS Take 1 tablet by mouth daily.    Past Week at Unknown time  . glipiZIDE (GLUCOTROL) 5 MG tablet Take 5 mg by mouth 2 (two) times daily.   2 Past Week at Unknown time  . Omega-3 Fatty Acids (KP FISH OIL) 1200 MG CAPS Take by mouth.   Past Week at Unknown time  . oxyCODONE (OXY IR/ROXICODONE) 5 MG immediate release tablet Take 5 mg by mouth at bedtime as needed for severe pain.   Past Week at Unknown time  . sertraline (ZOLOFT) 25 MG tablet Take 25 mg by mouth daily.    Past Week at Unknown time  . tamsulosin (FLOMAX) 0.4 MG CAPS capsule Take 1 capsule (0.4 mg total) by mouth daily. 30 capsule 11 Past Week at Unknown time  . tiZANidine (ZANAFLEX) 2 MG tablet Take 2 mg by mouth at bedtime.    Past Week at Unknown time  . traZODone (DESYREL) 50 MG tablet Take 50 mg by mouth at bedtime.    Past Week at Unknown time  . vitamin B-12 (CYANOCOBALAMIN) 1000 MCG tablet Take 1,000 mcg by mouth daily.    Past Week at  Unknown time  . warfarin (COUMADIN) 3 MG tablet Take 2.5-3 mg by mouth daily at 6 PM. Take 3 mg by mouth on Sunday, Monday and Friday and 2.5 mg on Tuesday, Wednesday, Thursday and Saturday   Past Week at Unknown time  . acetaminophen (TYLENOL) 325 MG tablet Take 650 mg by mouth every 6 (six) hours as needed for mild pain or moderate pain.    prn at prn  . albuterol (PROVENTIL HFA;VENTOLIN HFA) 108 (90 Base) MCG/ACT inhaler Inhale 2 puffs into the lungs every 6 (six) hours as needed. 1 Inhaler 0 prn at prn  . ciprofloxacin (CIPRO) 250 MG tablet Take 1 tablet (250 mg total) by mouth 2 (two) times daily. (Patient not taking: Reported on 05/04/2019) 14 tablet 0 Completed Course at Unknown time  . citalopram (CELEXA) 20 MG tablet Take 20 mg by mouth daily.     Marland Kitchen doxycycline (VIBRAMYCIN) 100 MG capsule Take 1 capsule (100 mg total) by mouth 2 (two) times  daily. (Patient not taking: Reported on 05/04/2019) 20 capsule 0 Completed Course at Unknown time  . famotidine (PEPCID) 20 MG tablet Take 20 mg by mouth 2 (two) times daily.      Marland Kitchen ketotifen (ZADITOR) 0.025 % ophthalmic solution INT 1 GTT IN OU BID FOR ALLERGIES OR ITCHING     . lactulose (CEPHULAC) 20 g packet Take 1 packet (20 g total) by mouth daily as needed (constipation). (Patient not taking: Reported on 05/04/2019) 5 each 0 Completed Course at Unknown time  . nitroGLYCERIN (NITROSTAT) 0.4 MG SL tablet Place 1 tablet (0.4 mg total) under the tongue every 5 (five) minutes as needed for chest pain. 30 tablet 0 prn at prn  . sulfamethoxazole-trimethoprim (BACTRIM DS) 800-160 MG tablet Take 1 tablet by mouth every 12 (twelve) hours. (Patient not taking: Reported on 05/04/2019) 14 tablet 0 Completed Course at Unknown time  . traMADol (ULTRAM) 50 MG tablet Take 1 tablet (50 mg total) by mouth every 6 (six) hours as needed. (Patient not taking: Reported on 05/04/2019) 15 tablet 0 Completed Course at Unknown time   Assessment: 78yo male on chronic Warfarin PTA.  INR 2.1 today when admitted.  Pt has had some vomiting and is now acutely ill.  Home dose listed as 3mg  on Sun, Mon, and Fri and 2.5mg  all other days.  Will give reduced dose of 2.5mg  tonight given acute illness and noted vomiting.   Home Regimen: Warfarin 3mg  Sun, Mon, Fri                              Warfarin  2.5mg  Tues, Weds, Thurs, Sat  DATE INR DOSE 8/7 2.1 2.5 mg 8/8  2.9   Goal of Therapy:  INR 2-3 Monitor platelets by anticoagulation protocol: Yes   Plan:  INR at top end of gaol range with 1 dose. Will order Warfarin 2 mg po tonight x 1 dose. On abx. F/u PT/INR tomorrow  Jihan Mellette A 05/05/2019,12:33 PM

## 2019-05-05 NOTE — Consult Note (Signed)
[address] [place], [place] [address] Phone [phone]. Fax [phone]  Date: 05/05/2019                  Patient Name:  Michael Bush  MRN: [id number]  DOB: 1941-03-25  Age / Sex: 78 y.o., male         PCP: Clarisse Gouge, MD                 Service Requesting Consult: IM/ Demetrios Loll, MD                 Reason for Consult: ARF            History of Present Illness: Patient is a 78 y.o. male with medical problems of CHF, CKD, stroke, COPD, Diabetes who was admitted to [facility] on 05/04/2019 for evaluation of Nausea, vomiting, fever, confusion.  Patient is minimally verbal.  Answers only a few yes or no questions.  All History is obtained from the chart Patient was brought from home by EMS for blood in the Foley bag.  Patient is currently on hospice at home.  In the ER he was noted to be febrile. Work-up so far shows patient's has a UTI.  He is currently being treated with ceftriaxone Nephrology consult is requested for AKI Baseline creatinine of 0.78, GFR greater than 60 from March 18, 2019 Presenting creatinine of 3.30 Patient had a CT scan with IV contrast on June 21 Patient is followed at 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  Patient has stable left chronic hydronephrosis, atrophic left kidney Patient was recently treated for Pseudomonas and Citrobacter UTI   Medications: Outpatient medications: Medications Prior to Admission  Medication Sig Dispense Refill Last Dose  . atorvastatin (LIPITOR) 40 MG tablet Take 40 mg by mouth daily at 6 PM.   3 Past Week at Unknown time  . B Complex Vitamins (VITAMIN B COMPLEX PO) Take 2 tablets by mouth daily.   Past Week at Unknown time  . Calcium Carb-Ergocalciferol 500-200 MG-UNIT TABS Take 1 tablet by mouth daily.    Past Week at Unknown time  . glipiZIDE (GLUCOTROL) 5 MG tablet Take 5 mg by mouth 2 (two) times daily.   2 Past Week at Unknown time  . Omega-3 Fatty Acids (KP FISH OIL) 1200  MG CAPS Take by mouth.   Past Week at Unknown time  . oxyCODONE (OXY IR/ROXICODONE) 5 MG immediate release tablet Take 5 mg by mouth at bedtime as needed for severe pain.   Past Week at Unknown time  . sertraline (ZOLOFT) 25 MG tablet Take 25 mg by mouth daily.    Past Week at Unknown time  . tamsulosin (FLOMAX) 0.4 MG CAPS capsule Take 1 capsule (0.4 mg total) by mouth daily. 30 capsule 11 Past Week at Unknown time  . tiZANidine (ZANAFLEX) 2 MG tablet Take 2 mg by mouth at bedtime.    Past Week at Unknown time  . traZODone (DESYREL) 50 MG tablet Take 50 mg by mouth at bedtime.    Past Week at Unknown time  . vitamin B-12 (CYANOCOBALAMIN) 1000 MCG tablet Take 1,000 mcg by mouth daily.    Past Week at Unknown time  . warfarin (COUMADIN) 3 MG tablet Take 2.5-3 mg by mouth daily at 6 PM. Take 3 mg by mouth on Sunday, Monday and Friday and 2.5 mg on Tuesday, Wednesday, Thursday and Saturday   Past Week at Unknown time  . acetaminophen (TYLENOL)  325 MG tablet Take 650 mg by mouth every 6 (six) hours as needed for mild pain or moderate pain.    prn at prn  . albuterol (PROVENTIL HFA;VENTOLIN HFA) 108 (90 Base) MCG/ACT inhaler Inhale 2 puffs into the lungs every 6 (six) hours as needed. 1 Inhaler 0 prn at prn  . ciprofloxacin (CIPRO) 250 MG tablet Take 1 tablet (250 mg total) by mouth 2 (two) times daily. (Patient not taking: Reported on 05/04/2019) 14 tablet 0 Completed Course at Unknown time  . citalopram (CELEXA) 20 MG tablet Take 20 mg by mouth daily.     Marland Kitchen doxycycline (VIBRAMYCIN) 100 MG capsule Take 1 capsule (100 mg total) by mouth 2 (two) times daily. (Patient not taking: Reported on 05/04/2019) 20 capsule 0 Completed Course at Unknown time  . famotidine (PEPCID) 20 MG tablet Take 20 mg by mouth 2 (two) times daily.      Marland Kitchen ketotifen (ZADITOR) 0.025 % ophthalmic solution INT 1 GTT IN OU BID FOR ALLERGIES OR ITCHING     . lactulose (CEPHULAC) 20 g packet Take 1 packet (20 g total) by mouth daily as needed  (constipation). (Patient not taking: Reported on 05/04/2019) 5 each 0 Completed Course at Unknown time  . nitroGLYCERIN (NITROSTAT) 0.4 MG SL tablet Place 1 tablet (0.4 mg total) under the tongue every 5 (five) minutes as needed for chest pain. 30 tablet 0 prn at prn  . sulfamethoxazole-trimethoprim (BACTRIM DS) 800-160 MG tablet Take 1 tablet by mouth every 12 (twelve) hours. (Patient not taking: Reported on 05/04/2019) 14 tablet 0 Completed Course at Unknown time  . traMADol (ULTRAM) 50 MG tablet Take 1 tablet (50 mg total) by mouth every 6 (six) hours as needed. (Patient not taking: Reported on 05/04/2019) 15 tablet 0 Completed Course at Unknown time    Current medications: Current Facility-Administered Medications  Medication Dose Route Frequency Provider Last Rate Last Dose  . 0.9 %  sodium chloride infusion   Intravenous Continuous Demetrios Loll, MD 100 mL/hr at 05/05/19 [phone]    . acetaminophen (TYLENOL) tablet 650 mg  650 mg Oral Q6H PRN Lang Snow, NP   650 mg at 05/04/19 2244  . atorvastatin (LIPITOR) tablet 40 mg  40 mg Oral q1800 Lang Snow, NP      . cefTRIAXone (ROCEPHIN) 1 g in sodium chloride 0.9 % 100 mL IVPB  1 g Intravenous Q24H Ouma, Bing Neighbors, NP      . famotidine (PEPCID) tablet 20 mg  20 mg Oral Daily Lang Snow, NP   20 mg at 05/05/19 9485  . insulin aspart (novoLOG) injection 0-5 Units  0-5 Units Subcutaneous QHS Lang Snow, NP   4 Units at 05/04/19 2243  . insulin aspart (novoLOG) injection 0-9 Units  0-9 Units Subcutaneous TID WC Lang Snow, NP   3 Units at 05/05/19 [phone]  . ketotifen (ZADITOR) 0.025 % ophthalmic solution 1 drop  1 drop Both Eyes BID Ouma, Bing Neighbors, NP      . nitroGLYCERIN (NITROSTAT) SL tablet 0.4 mg  0.4 mg Sublingual Q5 min PRN Lang Snow, NP      . oxyCODONE (Oxy IR/ROXICODONE) immediate release tablet 5 mg  5 mg Oral QHS PRN Lang Snow, NP      .  sertraline (ZOLOFT) tablet 25 mg  25 mg Oral Daily Lang Snow, NP   25 mg at 05/05/19 0350  . tamsulosin (FLOMAX) capsule 0.4 mg  0.4 mg Oral Daily  Lang Snow, NP   0.4 mg at 05/05/19 6301  . traZODone (DESYREL) tablet 50 mg  50 mg Oral QHS Lang Snow, NP   50 mg at 05/04/19 2243  . vitamin B-12 (CYANOCOBALAMIN) tablet 1,000 mcg  1,000 mcg Oral Daily Lang Snow, NP   1,000 mcg at 05/05/19 [phone]  . Warfarin - Pharmacist Dosing Inpatient   Does not apply q1800 Ena Dawley, [facility]          Allergies: Allergies  Allergen Reactions  . Vancomycin Swelling    Facial swelling  . Dabigatran Other (See Comments)    Bleeding complications Bleeding complications   . Pollen Extract   . Pradaxa [Dabigatran Etexilate Mesylate]       Past Medical History: Past Medical History:  Diagnosis Date  . Arthritis   . CHF (congestive heart failure) ([place])   . Chronic kidney disease    chronic kidney disease  . Colon cancer ([place]) 2003   Hx Partial colon resection, chemo + rad tx's.  Marland Kitchen COPD (chronic obstructive pulmonary disease) ([place])   . Coronary artery disease   . Diabetes mellitus without complication ([place])   . Dysrhythmia    atrial fib., bradycardia  . GERD (gastroesophageal reflux disease)   . Hypertension   . Hypothyroidism   . Long term (current) use of anticoagulants   . Mixed hearing loss, unilateral    03/07/14  . Myocardial infarction ([place])   . Presence of permanent cardiac pacemaker    [employer] DDDR [phone]  . Pseudophakia of both eyes   . Sick sinus syndrome ([place]) 04/26/2014  . Stroke [facility])      Past Surgical History: Past Surgical History:  Procedure Laterality Date  . Cardioversion External    . Cataract cortical ssenile    . COLON SURGERY    . COLON SURGERY    . COLONOSCOPY WITH PROPOFOL N/A 01/27/2018   Procedure: COLONOSCOPY WITH PROPOFOL;  Surgeon: Lollie Sails, MD;  Location: [facility];   Service: Endoscopy;  Laterality: N/A;  . CORONARY ANGIOPLASTY    . Insertion dual chamber pacemaker generator    . IR CATHETER TUBE CHANGE  03/16/2019  . IR CATHETER TUBE CHANGE  04/13/2019     Family History: Family History  Problem Relation Age of Onset  . Cancer Father      Social History: Social History   Socioeconomic History  . Marital status: Married    Spouse name: Not on file  . Number of children: Not on file  . Years of education: Not on file  . Highest education level: Not on file  Occupational History  . Not on file  Social Needs  . Financial resource strain: Not on file  . Food insecurity    Worry: Not on file    Inability: Not on file  . Transportation needs    Medical: Not on file    Non-medical: Not on file  Tobacco Use  . Smoking status: Former Smoker    Packs/day: 2.00    Years: 30.00    Pack years: 60.00    Types: Cigarettes  . Smokeless tobacco: Never Used  Substance and Sexual Activity  . Alcohol use: No  . Drug use: No  . Sexual activity: Not Currently  Lifestyle  . Physical activity    Days per week: Not on file    Minutes per session: Not on file  . Stress: Not on file  Relationships  . Social connections  Talks on phone: Not on file    Gets together: Not on file    Attends religious service: Not on file    Active member of club or organization: Not on file    Attends meetings of clubs or organizations: Not on file    Relationship status: Not on file  . Intimate partner violence    Fear of current or ex partner: Not on file    Emotionally abused: Not on file    Physically abused: Not on file    Forced sexual activity: Not on file  Other Topics Concern  . Not on file  Social History Narrative  . Not on file     Review of Systems:Not available Gen:  HEENT:  CV:  Resp:  GI: GU :  MS:  Derm:    Psych: Heme:  Neuro:  Endocrine  Vital Signs: Blood pressure (!) 144/67, pulse 70, temperature (!) 97.4 F (36.3 C),  temperature source Oral, resp. rate 20, height 5\' 9"  (1.753 m), weight 80.3 kg, SpO2 95 %.   Intake/Output Summary (Last 24 hours) at 05/05/2019 1601 Last data filed at 05/05/2019 0500 Gross per 24 hour  Intake 2500 ml  Output 0 ml  Net 2500 ml    Weight trends: [employer]   05/04/19 1721 05/04/19 2229  Weight: 79.8 kg 80.3 kg    Physical Exam: General:  Chronically ill-appearing gentleman, laying in the bed  HEENT Moist oral mucous membranes  Neck:  Supple  Lungs: Normal breathing effort, coarse breath sounds at bases  Heart:: Irregular  Abdomen: Soft, nontender, suprapubic catheter in place  Extremities:  Trace edema  Neurologic: Slurred speech due to stroke, able to follow few simple basic commands  Skin: Dry    Lab results: Basic Metabolic Panel: Recent Labs  Lab 05/04/19 1725 05/05/19 0102  NA 140 139  K 4.6 4.9  CL 102 108  CO2 23 21*  GLUCOSE 334* 288*  BUN 35* 39*  CREATININE 3.30* 3.81*  CALCIUM 9.2 8.1*    Liver Function Tests: Recent Labs  Lab 05/04/19 1725  AST 23  ALT 18  ALKPHOS 74  BILITOT 1.2  PROT 7.7  ALBUMIN 3.7   No results for input(s): LIPASE, AMYLASE in the last 168 hours. No results for input(s): AMMONIA in the last 168 hours.  CBC: Recent Labs  Lab 05/04/19 1725 05/05/19 0102  WBC 18.4* 14.0*  NEUTROABS 16.8*  --   HGB 14.8 12.5*  HCT 45.4 38.6*  MCV 88.2 88.5  PLT 214 163    Cardiac Enzymes: No results for input(s): CKTOTAL, TROPONINI in the last 168 hours.  BNP: Invalid input(s): POCBNP  CBG: Recent Labs  Lab 05/04/19 2231 05/05/19 0804  GLUCAP 323* 222*    Microbiology: Recent Results (from the past 720 hour(s))  SARS Coronavirus 2 [facility] order, Performed in [facility] hospital lab) Nasopharyngeal Nasopharyngeal Swab     Status: None   Collection Time: 05/04/19  6:50 PM   Specimen: Nasopharyngeal Swab  Result Value Ref Range Status   SARS Coronavirus 2 NEGATIVE NEGATIVE Final    Comment:  (NOTE) If result is NEGATIVE SARS-CoV-2 target nucleic acids are NOT DETECTED. The SARS-CoV-2 RNA is generally detectable in upper and lower  respiratory specimens during the acute phase of infection. The lowest  concentration of SARS-CoV-2 viral copies this assay can detect is 250  copies / mL. A negative result does not preclude SARS-CoV-2 infection  and should not be used as the sole  basis for treatment or other  patient management decisions.  A negative result may occur with  improper specimen collection / handling, submission of specimen other  than nasopharyngeal swab, presence of viral mutation(s) within the  areas targeted by this assay, and inadequate number of viral copies  (<250 copies / mL). A negative result must be combined with clinical  observations, patient history, and epidemiological information. If result is POSITIVE SARS-CoV-2 target nucleic acids are DETECTED. The SARS-CoV-2 RNA is generally detectable in upper and lower  respiratory specimens dur ing the acute phase of infection.  Positive  results are indicative of active infection with SARS-CoV-2.  Clinical  correlation with patient history and other diagnostic information is  necessary to determine patient infection status.  Positive results do  not rule out bacterial infection or co-infection with other viruses. If result is PRESUMPTIVE POSTIVE SARS-CoV-2 nucleic acids MAY BE PRESENT.   A presumptive positive result was obtained on the submitted specimen  and confirmed on repeat testing.  While 2019 novel coronavirus  (SARS-CoV-2) nucleic acids may be present in the submitted sample  additional confirmatory testing may be necessary for epidemiological  and / or clinical management purposes  to differentiate between  SARS-CoV-2 and other Sarbecovirus currently known to infect humans.  If clinically indicated additional testing with an alternate test  methodology [phone]) is advised. The SARS-CoV-2 RNA is  generally  detectable in upper and lower respiratory sp ecimens during the acute  phase of infection. The expected result is Negative. Fact Sheet for Patients:  StrictlyIdeas.no Fact Sheet for Healthcare Providers: BankingDealers.co.za This test is not yet approved or cleared by the [place] FDA and has been authorized for detection and/or diagnosis of SARS-CoV-2 by FDA under an Emergency Use Authorization (EUA).  This EUA will remain in effect (meaning this test can be used) for the duration of the COVID-19 declaration under Section 564(b)(1) of the Act, 21 U.S.C. section 360bbb-3(b)(1), unless the authorization is terminated or revoked sooner. Performed at [facility] [address] [place], [place] [address]      Coagulation Studies: Recent Labs    05/04/19 1725  LABPROT 23.5*  INR 2.1*    Urinalysis: Recent Labs    05/04/19 1725  COLORURINE AMBER*  LABSPEC 1.018  PHURINE 5.0  GLUCOSEU >=500*  HGBUR LARGE*  BILIRUBINUR NEGATIVE  KETONESUR NEGATIVE  PROTEINUR 100*  NITRITE POSITIVE*  LEUKOCYTESUR SMALL*        Imaging: US Renal  Result Date: 05/04/2019 CLINICAL DATA:  Acute renal injury EXAM: RENAL / URINARY TRACT ULTRASOUND COMPLETE COMPARISON:  03/18/2019 CT of the abdomen and pelvis. FINDINGS: Right Kidney: Renal measurements: 15.2 x 8.0 x 7.5 cm. = volume: 475 mL. Mild hydronephrosis is noted. Previously seen renal stone is not well appreciated and may have migrated into the right ureter. 3 cm cyst is noted similar to that seen on prior CT examination. Left Kidney: Renal measurements: 13.6 x 7.3 x 8.0 cm = volume: 417 mL. Severe longstanding hydronephrosis is noted with cortical thinning similar to that seen on prior CT examination. 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 Mild hydronephrosis on the right.  Previously seen renal calculus is not well appreciated and may have migrated into the ureter. CT urogram may be helpful. Right renal cyst stable from the prior CT. Electronically Signed   By: Inez Catalina M.D.   On: 05/04/2019 22:14   Dg Chest [facility] 1 [address]  Result Date: 05/04/2019 CLINICAL DATA:  Blood in catheterization bag. EXAM: PORTABLE CHEST 1 VIEW COMPARISON:  March 18, 2019 FINDINGS: Stable pacemaker. Stable cardiomegaly. The hila, mediastinum, lungs, and pleura are otherwise unremarkable. IMPRESSION: No acute abnormalities. Electronically Signed   By: Dorise Bullion III M.D   On: 05/04/2019 18:23      Assessment & Plan: Pt is a 78 y.o. Caucasian  male with Multiple chronic conditions including congestive heart failure, COPD, coronary disease, diabetes, atrial fibrillation, hypothyroidism, hearing loss, history of cardiac pacemaker placement, stroke, requiring suprapubic catheter, recent multiple UTIs, was admitted on 05/04/2019 with Fever, altered mental status.   1.  Acute kidney injury. Likely ATN. Multifactorial with contribution from concurrent UTI, IV contrast exposure on June 21 Serum creatinine is continuing to worsen Urine output not recorded Due to multiple comorbidities, patient would not be a candidate for hemodialysis.  Continue to treat underlying conditions including urinary tract infection Avoid nephrotoxins including IV contrast, nonsteroidals.  Avoid hypotension Agree with gentle hydration Continue to monitor closely  2.  Diabetes type 2 Lab Results  Component Value Date   HGBA1C 7.6 (H) 05/04/2019       LOS: 1 Haidy Kackley 8/8/20209:28 AM    Note: This note was prepared with Dragon dictation. Any transcription errors are unintentional

## 2019-05-06 DIAGNOSIS — N390 Urinary tract infection, site not specified: Secondary | ICD-10-CM

## 2019-05-06 DIAGNOSIS — A419 Sepsis, unspecified organism: Secondary | ICD-10-CM

## 2019-05-06 LAB — BASIC METABOLIC PANEL
Anion gap: 12 (ref 5–15)
BUN: 68 mg/dL — ABNORMAL HIGH (ref 8–23)
CO2: 20 mmol/L — ABNORMAL LOW (ref 22–32)
Calcium: 8.1 mg/dL — ABNORMAL LOW (ref 8.9–10.3)
Chloride: 111 mmol/L (ref 98–111)
Creatinine, Ser: 6.1 mg/dL — ABNORMAL HIGH (ref 0.61–1.24)
GFR calc Af Amer: 9 mL/min — ABNORMAL LOW (ref >60)
GFR calc non Af Amer: 8 mL/min — ABNORMAL LOW (ref >60)
Glucose, Bld: 163 mg/dL — ABNORMAL HIGH (ref 70–99)
Potassium: 5.5 mmol/L — ABNORMAL HIGH (ref 3.5–5.1)
Sodium: 143 mmol/L (ref 135–145)

## 2019-05-06 LAB — PROTIME-INR
INR: 4.3 (ref 0.8–1.2)
Prothrombin Time: 40.7 seconds — ABNORMAL HIGH (ref 11.4–15.2)

## 2019-05-06 LAB — URINE CULTURE

## 2019-05-06 LAB — GLUCOSE, CAPILLARY
Glucose-Capillary: 147 mg/dL — ABNORMAL HIGH (ref 70–99)
Glucose-Capillary: 174 mg/dL — ABNORMAL HIGH (ref 70–99)
Glucose-Capillary: 153 mg/dL — ABNORMAL HIGH (ref 70–99)
Glucose-Capillary: 148 mg/dL — ABNORMAL HIGH (ref 70–99)

## 2019-05-06 LAB — LACTIC ACID, PLASMA: Lactic Acid, Venous: 1.6 mmol/L (ref 0.5–1.9)

## 2019-05-06 MED ORDER — SODIUM CHLORIDE 0.9 % IV SOLN
INTRAVENOUS | Status: AC
  Administered 2019-05-06 – 2019-05-07 (×2): via INTRAVENOUS

## 2019-05-06 MED ORDER — SODIUM ZIRCONIUM CYCLOSILICATE 10 G PO PACK
10.0000 g | PACK | Freq: Every day | ORAL | Status: DC
  Administered 2019-05-06 – 2019-05-08 (×3): 10 g via ORAL
  Filled 2019-05-06 (×3): qty 1

## 2019-05-06 MED ORDER — ALUM & MAG HYDROXIDE-SIMETH 200-200-20 MG/5ML PO SUSP
30.0000 mL | Freq: Four times a day (QID) | ORAL | Status: DC | PRN
  Administered 2019-05-06: 30 mL via ORAL
  Filled 2019-05-06: qty 30

## 2019-05-06 MED ORDER — NEPRO/CARBSTEADY PO LIQD
237.0000 mL | Freq: Two times a day (BID) | ORAL | Status: DC
  Administered 2019-05-06 – 2019-05-08 (×3): 237 mL via ORAL

## 2019-05-06 NOTE — Progress Notes (Signed)
[facility], [place] 05/06/19  Subjective:  No acute events Serum creatinine further worsened to 6.10 Patient answer simple yes/no questions  Objective:  Vital signs in last 24 hours:  Temp:  [97.4 F (36.3 C)-99 F (37.2 C)] 97.4 F (36.3 C) (08/09 0433) Pulse Rate:  [61-70] 62 (08/09 1145) Resp:  [20] 20 (08/09 0433) BP: (127-151)/(64-71) 151/71 (08/09 1145) SpO2:  [93 %-95 %] 93 % (08/09 1145)  Weight change:  Filed Weights   05/04/19 1721 05/04/19 2229  Weight: 79.8 kg 80.3 kg    Intake/Output:    Intake/Output Summary (Last 24 hours) at 05/06/2019 1256 Last data filed at 05/06/2019 9323 Gross per 24 hour  Intake 793.12 ml  Output 0 ml  Net 793.12 ml   Physical Exam: General:  Chronically ill-appearing gentleman, laying in the bed  HEENT Moist oral mucous membranes  Neck:  Supple  Lungs: Normal breathing effort, coarse breath sounds at bases  Heart:: Irregular  Abdomen: Soft, nontender, suprapubic catheter in place  Extremities:  Trace edema  Neurologic: Slurred speech due to stroke, able to follow few simple basic commands  Skin: Dry    Basic Metabolic Panel:  Recent Labs  Lab 05/04/19 1725 05/05/19 0102 05/05/19 1055 05/06/19 0615  NA 140 139 139 143  K 4.6 4.9 5.3* 5.5*  CL 102 108 106 111  CO2 23 21* 21* 20*  GLUCOSE 334* 288* 262* 163*  BUN 35* 39* 50* 68*  CREATININE 3.30* 3.81* 4.81* 6.10*  CALCIUM 9.2 8.1* 8.4* 8.1*     CBC: Recent Labs  Lab 05/04/19 1725 05/05/19 0102  WBC 18.4* 14.0*  NEUTROABS 16.8*  --   HGB 14.8 12.5*  HCT 45.4 38.6*  MCV 88.2 88.5  PLT 214 163     No results found for: HEPBSAG, HEPBSAB, HEPBIGM    Microbiology:  Recent Results (from the past 240 hour(s))  Urine culture     Status: Abnormal   Collection Time: 05/04/19  5:25 PM   Specimen: Urine, Random  Result Value Ref Range Status   Specimen Description   Final    URINE, RANDOM Performed at [facility],  [address], [place], [place] [address]    Special Requests   Final    NONE Performed at [facility], [address] [place], [place] [address]    Culture MULTIPLE SPECIES PRESENT, [place] (A)  Final   Report Status 05/06/2019 FINAL  Final  SARS Coronavirus 2 [facility] order, Performed in [facility] hospital lab) Nasopharyngeal Nasopharyngeal Swab     Status: None   Collection Time: 05/04/19  6:50 PM   Specimen: Nasopharyngeal Swab  Result Value Ref Range Status   SARS Coronavirus 2 NEGATIVE NEGATIVE Final    Comment: (NOTE) If result is NEGATIVE SARS-CoV-2 target nucleic acids are NOT DETECTED. The SARS-CoV-2 RNA is generally detectable in upper and lower  respiratory specimens during the acute phase of infection. The lowest  concentration of SARS-CoV-2 viral copies this assay can detect is 250  copies / mL. A negative result does not preclude SARS-CoV-2 infection  and should not be used as the sole basis for treatment or other  patient management decisions.  A negative result may occur with  improper specimen collection / handling, submission of specimen other  than nasopharyngeal swab, presence of viral mutation(s) within the  areas targeted by this assay, and inadequate number of viral copies  (<250 copies / mL). A negative result must be combined  with clinical  observations, patient history, and epidemiological information. If result is POSITIVE SARS-CoV-2 target nucleic acids are DETECTED. The SARS-CoV-2 RNA is generally detectable in upper and lower  respiratory specimens dur ing the acute phase of infection.  Positive  results are indicative of active infection with SARS-CoV-2.  Clinical  correlation with patient history and other diagnostic information is  necessary to determine patient infection status.  Positive results do  not rule out bacterial infection or co-infection with other viruses. If result is PRESUMPTIVE POSTIVE SARS-CoV-2  nucleic acids MAY BE PRESENT.   A presumptive positive result was obtained on the submitted specimen  and confirmed on repeat testing.  While 2019 novel coronavirus  (SARS-CoV-2) nucleic acids may be present in the submitted sample  additional confirmatory testing may be necessary for epidemiological  and / or clinical management purposes  to differentiate between  SARS-CoV-2 and other Sarbecovirus currently known to infect humans.  If clinically indicated additional testing with an alternate test  methodology [phone]) is advised. The SARS-CoV-2 RNA is generally  detectable in upper and lower respiratory sp ecimens during the acute  phase of infection. The expected result is Negative. Fact Sheet for Patients:  StrictlyIdeas.no Fact Sheet for Healthcare Providers: BankingDealers.co.za This test is not yet approved or cleared by the [place] FDA and has been authorized for detection and/or diagnosis of SARS-CoV-2 by FDA under an Emergency Use Authorization (EUA).  This EUA will remain in effect (meaning this test can be used) for the duration of the COVID-19 declaration under Section 564(b)(1) of the Act, 21 U.S.C. section 360bbb-3(b)(1), unless the authorization is terminated or revoked sooner. Performed at [facility], [address] [place], [place] [address]     Coagulation Studies: Recent Labs    05/04/19 1725 05/05/19 1055 05/06/19 0615  LABPROT 23.5* 29.8* 40.7*  INR 2.1* 2.9* 4.3*    Urinalysis: Recent Labs    05/04/19 1725  COLORURINE AMBER*  LABSPEC 1.018  PHURINE 5.0  GLUCOSEU >=500*  HGBUR LARGE*  BILIRUBINUR NEGATIVE  KETONESUR NEGATIVE  PROTEINUR 100*  NITRITE POSITIVE*  LEUKOCYTESUR SMALL*      Imaging: US Renal  Result Date: 05/04/2019 CLINICAL DATA:  Acute renal injury EXAM: RENAL / URINARY TRACT ULTRASOUND COMPLETE COMPARISON:  03/18/2019 CT of the abdomen and pelvis. FINDINGS:  Right Kidney: Renal measurements: 15.2 x 8.0 x 7.5 cm. = volume: 475 mL. Mild hydronephrosis is noted. Previously seen renal stone is not well appreciated and may have migrated into the right ureter. 3 cm cyst is noted similar to that seen on prior CT examination. Left Kidney: Renal measurements: 13.6 x 7.3 x 8.0 cm = volume: 417 mL. Severe longstanding hydronephrosis is noted with cortical thinning similar to that seen on prior CT examination. 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 Mild hydronephrosis on the right. Previously seen renal calculus is not well appreciated and may have migrated into the ureter. CT urogram may be helpful. Right renal cyst stable from the prior CT. Electronically Signed   By: Inez Catalina M.D.   On: 05/04/2019 22:14   Dg Chest Port 1 View  Result Date: 05/04/2019 CLINICAL DATA:  Blood in catheterization bag. EXAM: PORTABLE CHEST 1 VIEW COMPARISON:  March 18, 2019 FINDINGS: Stable pacemaker. Stable cardiomegaly. The hila, mediastinum, lungs, and pleura are otherwise unremarkable. IMPRESSION: No acute abnormalities. Electronically Signed   By: Dorise Bullion III M.D   On: 05/04/2019 18:23  Medications:   . sodium chloride 50 mL/hr at 05/06/19 0820  . cefTRIAXone (ROCEPHIN)  IV 1 g (05/05/19 1804)   . atorvastatin  40 mg Oral q1800  . famotidine  20 mg Oral Daily  . feeding supplement (NEPRO CARB STEADY)  237 mL Oral BID BM  . insulin aspart  0-5 Units Subcutaneous QHS  . insulin aspart  0-9 Units Subcutaneous TID WC  . ketotifen  1 drop Both Eyes BID  . sertraline  25 mg Oral Daily  . sodium zirconium cyclosilicate  10 g Oral Daily  . tamsulosin  0.4 mg Oral Daily  . traZODone  50 mg Oral QHS  . vitamin B-12  1,000 mcg Oral Daily  . Warfarin - Pharmacist Dosing Inpatient   Does not apply q1800   acetaminophen, nitroGLYCERIN, oxyCODONE  Assessment/ Plan:  78 y.o. Caucasian  male with Multiple chronic conditions including congestive heart failure, COPD, coronary disease, diabetes, atrial fibrillation, hypothyroidism, hearing loss, history of cardiac pacemaker placement, stroke, requiring suprapubic catheter, recent multiple UTIs, was admitted on 05/04/2019 with Fever, altered mental status.   1.  Acute kidney injury. Likely ATN. Multifactorial with contribution from concurrent UTI, IV contrast exposure on June 21 Serum creatinine is continuing to worsen Urine output minimal Due to multiple comorbidities, patient would not be a candidate for hemodialysis.  Continue to treat underlying conditions including urinary tract infection Avoid nephrotoxins including IV contrast, nonsteroidals.  Avoid hypotension Agree with gentle hydration Continue to monitor closely     LOS: [place] 8/9/202012:56 PM  [place], [place]  Note: This note was prepared with Dragon dictation. Any transcription errors are unintentional

## 2019-05-06 NOTE — Progress Notes (Signed)
Initial Nutrition Assessment  DOCUMENTATION CODES:   Not applicable  INTERVENTION:  Provide Nepro Shake po BID, each supplement provides 425 kcal and 19 grams protein.  NUTRITION DIAGNOSIS:   Inadequate oral intake related to decreased appetite as evidenced by per patient/family report.  GOAL:   Patient will meet greater than or equal to 90% of their needs  MONITOR:   PO intake, Supplement acceptance, Labs, Weight trends, I & O's  REASON FOR ASSESSMENT:   Malnutrition Screening Tool    ASSESSMENT:   78 year old male with PMHx of COPD, DM, GERD, hx MI, hx CVA with residual left-sided hemiplegia, arthritis, CAD, hx placement of permanent cardiac pacemaker, CHF, CKD IV, HTN, hypothyroidism admitted with sepsis likely secondary to UTI, AKI on CKD IV.   -Per chart patient was followed by hospice at home. -Per chart patient is not a candidate for dialysis.  Met with patient at bedside. He reports his appetite and PO intake have been decreased for a while now. He reports he is not very hungry. He has left-sided hemiplegia and requires assistance with meals and that he is very appreciative of assistance he is receiving at his meals on unit. He reports he is on a regular texture diet with thin liquids at baseline. He reports he is not ambulatory. No meal completion completed at this time and patient cannot recall how much of his meals he is finishing.  Patient reports that his UBW is 198 lbs (90 kg) and that he has not been losing weight. Per chart patient's weight has been trending down. He was 89.8 kg on 10/15/2018. He is now 80.3 kg (177.03 lbs). He has lost 9.5 kg (10.6% body weight) over the past 7 months, which is not quite significant for time frame but is still concerning.   Medications reviewed and include: famotidine, Novolog 0-9 units TID, Novolog 0-5 units QHS, Lokelma 10 grams daily, Flomax, vitamin B12 1000 micrograms daily, Warfarin, NS at 50 mL/hr, ceftriaxone.  Labs  reviewed: CBG 147-189, Potassium 5.5, CO2 20, BUN 68, Creatinine 6.1.  Patient is at risk for malnutrition.  NUTRITION - FOCUSED PHYSICAL EXAM:    Most Recent Value  Orbital Region  Mild depletion  Upper Arm Region  No depletion  Thoracic and Lumbar Region  No depletion  Buccal Region  No depletion  Temple Region  Mild depletion  Clavicle Bone Region  Mild depletion  Clavicle and Acromion Bone Region  Mild depletion  Scapular Bone Region  Unable to assess  Dorsal Hand  Mild depletion  Patellar Region  Moderate depletion  Anterior Thigh Region  Moderate depletion  Posterior Calf Region  Moderate depletion  Edema (RD Assessment)  Mild  Hair  Reviewed  Eyes  Reviewed  Mouth  Reviewed  Skin  Reviewed  Nails  Reviewed     Diet Order:   Diet Order            Diet heart healthy/carb modified Room service appropriate? Yes; Fluid consistency: Thin  Diet effective now             EDUCATION NEEDS:   No education needs have been identified at this time  Skin:  Skin Assessment: Skin Integrity Issues:(MSAD to buttocks and perineum)  Last BM:  05/05/2019 per chart  Height:   Ht Readings from Last 1 Encounters:  05/04/19 '5\' 9"'$  (1.753 m)   Weight:   Wt Readings from Last 1 Encounters:  05/04/19 80.3 kg   Ideal Body Weight:  72.7 kg  BMI:  Body mass index is 26.14 kg/m.  Estimated Nutritional Needs:   Kcal:  1815-2100  Protein:  80-90 grams  Fluid:  2 L/day  Willey Blade, MS, RD, LDN Office: [phone] Pager: [phone] After Hours/Weekend Pager: [phone]

## 2019-05-06 NOTE — Plan of Care (Signed)
No output from suprapubic catheter over the night. Bladder scan was 80ml. MD notified. No new orders. Will continue to monitor.

## 2019-05-06 NOTE — Consult Note (Signed)
Urology Consult  I have been asked to see the patient by Dr. Bridgett Larsson, for evaluation and management of "urinary retention managed by suprapubic tube".  Chief Complaint: N/A  History of Present Illness: Michael Bush is a 78 y.o. year old with a past urologic history of severe chronic left hydronephrosis with an atrophic left kidney which is asymptomatic.  He has a history of recurrent stone disease underwent PCNL in 2010 and ureteroscopic stone removal in 2018.  He also has a history of BPH with lower urinary tract symptoms with a moderate postvoid residual ~300 mL.  He had a CVA late October 2019 and found to be in urinary retention.  He had persistent urinary retention and was on intermittent catheterization which was being performed by his wife. She was having increasing difficulty performing intermittent catheterization and he had percutaneous SP tube placed by interventional radiology in May 2020.  The catheter was upsized in June and his SP tube was changed in our office on 8/4 placing 16 French Foley catheter.  He has had recurrent urinary tract infections.  He was admitted 05/04/2019 with sepsis suspected from a urinary source.  He was noted to have acute kidney injury with creatinine of 3.3.  He has been evaluated by nephrology.  Renal ultrasound was felt to show mild right hydronephrosis.  He has been severely oliguric.  Creatinine this morning was 6.10.  He has been seen by nephrology and is not a candidate for dialysis.  He is currently under hospice at home care and is a DNR.  Urine culture on admission grew multiple species.   Past Medical History:  Diagnosis Date  . Arthritis   . CHF (congestive heart failure) ([facility])   . Chronic kidney disease    chronic kidney disease  . Colon cancer ([place]) 2003   Hx Partial colon resection, chemo + rad tx's.  Marland Kitchen COPD (chronic obstructive pulmonary disease) ([place])   . Coronary artery disease   . Diabetes mellitus without complication  ([place])   . Dysrhythmia    atrial fib., bradycardia  . GERD (gastroesophageal reflux disease)   . Hypertension   . Hypothyroidism   . Long term (current) use of anticoagulants   . Mixed hearing loss, unilateral    03/07/14  . Myocardial infarction ([place])   . Presence of permanent cardiac pacemaker    [employer] DDDR [phone]  . Pseudophakia of both eyes   . Sick sinus syndrome ([place]) 04/26/2014  . Stroke [facility])     Past Surgical History:  Procedure Laterality Date  . Cardioversion External    . Cataract cortical ssenile    . COLON SURGERY    . COLON SURGERY    . COLONOSCOPY WITH PROPOFOL N/A 01/27/2018   Procedure: COLONOSCOPY WITH PROPOFOL;  Surgeon: Lollie Sails, MD;  Location: [facility] ENDOSCOPY;  Service: Endoscopy;  Laterality: N/A;  . CORONARY ANGIOPLASTY    . Insertion dual chamber pacemaker generator    . IR CATHETER TUBE CHANGE  03/16/2019  . IR CATHETER TUBE CHANGE  04/13/2019    Home Medications:  Current Meds  Medication Sig  . atorvastatin (LIPITOR) 40 MG tablet Take 40 mg by mouth daily at 6 PM.   . B Complex Vitamins (VITAMIN B COMPLEX PO) Take 2 tablets by mouth daily.  . Calcium Carb-Ergocalciferol 500-200 MG-UNIT TABS Take 1 tablet by mouth daily.   Marland Kitchen glipiZIDE (GLUCOTROL) 5 MG tablet Take 5 mg by mouth 2 (two) times daily.   Marland Kitchen  Omega-3 Fatty Acids (KP FISH OIL) 1200 MG CAPS Take by mouth.  . oxyCODONE (OXY IR/ROXICODONE) 5 MG immediate release tablet Take 5 mg by mouth at bedtime as needed for severe pain.  Marland Kitchen sertraline (ZOLOFT) 25 MG tablet Take 25 mg by mouth daily.   . tamsulosin (FLOMAX) 0.4 MG CAPS capsule Take 1 capsule (0.4 mg total) by mouth daily.  Marland Kitchen tiZANidine (ZANAFLEX) 2 MG tablet Take 2 mg by mouth at bedtime.   . traZODone (DESYREL) 50 MG tablet Take 50 mg by mouth at bedtime.   . vitamin B-12 (CYANOCOBALAMIN) 1000 MCG tablet Take 1,000 mcg by mouth daily.   Marland Kitchen warfarin (COUMADIN) 3 MG tablet Take 2.5-3 mg by mouth daily at 6 PM. Take 3  mg by mouth on Sunday, Monday and Friday and 2.5 mg on Tuesday, Wednesday, Thursday and Saturday    Allergies:  Allergies  Allergen Reactions  . Vancomycin Swelling    Facial swelling  . Dabigatran Other (See Comments)    Bleeding complications Bleeding complications   . Pollen Extract   . Pradaxa [Dabigatran Etexilate Mesylate]     Family History  Problem Relation Age of Onset  . Cancer Father     Social History:  reports that he has quit smoking. His smoking use included cigarettes. He has a 60.00 pack-year smoking history. He has never used smokeless tobacco. He reports that he does not drink alcohol or use drugs.  ROS: A complete review of systems was performed.  All systems are negative except for pertinent findings as noted.  Physical Exam:  Vital signs in last 24 hours: Temp:  [97.4 F (36.3 C)-100.9 F (38.3 C)] 97.4 F (36.3 C) (08/09 0433) Pulse Rate:  [61-70] 70 (08/09 0433) Resp:  [20-26] 20 (08/09 0433) BP: (127-144)/(61-67) 144/67 (08/09 0433) SpO2:  [93 %-96 %] 95 % (08/09 0433) Constitutional:  Alert and oriented, No acute distress HEENT: [place] AT, moist mucus membranes.  Trachea midline, no masses Cardiovascular: Regular rate and rhythm, no clubbing, cyanosis, or edema. Respiratory: Normal respiratory effort, lungs clear bilaterally GI: Abdomen is soft, nontender, nondistended, no abdominal masses GU: No CVA tenderness Skin: No rashes, bruises or suspicious lesions Lymph: No cervical or inguinal adenopathy Neurologic: Grossly intact, no focal deficits, moving all 4 extremities Psychiatric: Normal mood and affect   Laboratory Data:  Recent Labs    05/04/19 1725 05/05/19 0102  WBC 18.4* 14.0*  HGB 14.8 12.5*  HCT 45.4 38.6*   Recent Labs    05/05/19 0102 05/05/19 1055 05/06/19 0615  NA 139 139 143  K 4.9 5.3* 5.5*  CL 108 106 111  CO2 21* 21* 20*  GLUCOSE 288* 262* 163*  BUN 39* 50* 68*  CREATININE 3.81* 4.81* 6.10*  CALCIUM 8.1* 8.4*  8.1*   Recent Labs    05/04/19 1725 05/05/19 1055 05/06/19 0615  INR 2.1* 2.9* 4.3*   No results for input(s): LABURIN in the last 72 hours. Results for orders placed or performed during the hospital encounter of 05/04/19  Urine culture     Status: Abnormal   Collection Time: 05/04/19  5:25 PM   Specimen: Urine, Random  Result Value Ref Range Status   Specimen Description   Final    URINE, RANDOM Performed at [facility], [address] [place], [place] [address]    Special Requests   Final    NONE Performed at [facility], [address], [place], [place] [address]    Culture MULTIPLE SPECIES  PRESENT, SUGGEST RECOLLECTION (A)  Final   Report Status 05/06/2019 FINAL  Final  SARS Coronavirus 2 [facility] order, Performed in [facility] hospital lab) Nasopharyngeal Nasopharyngeal Swab     Status: None   Collection Time: 05/04/19  6:50 PM   Specimen: Nasopharyngeal Swab  Result Value Ref Range Status   SARS Coronavirus 2 NEGATIVE NEGATIVE Final    Comment: (NOTE) If result is NEGATIVE SARS-CoV-2 target nucleic acids are NOT DETECTED. The SARS-CoV-2 RNA is generally detectable in upper and lower  respiratory specimens during the acute phase of infection. The lowest  concentration of SARS-CoV-2 viral copies this assay can detect is 250  copies / mL. A negative result does not preclude SARS-CoV-2 infection  and should not be used as the sole basis for treatment or other  patient management decisions.  A negative result may occur with  improper specimen collection / handling, submission of specimen other  than nasopharyngeal swab, presence of viral mutation(s) within the  areas targeted by this assay, and inadequate number of viral copies  (<250 copies / mL). A negative result must be combined with clinical  observations, patient history, and epidemiological information. If result is POSITIVE SARS-CoV-2 target nucleic acids are DETECTED. The SARS-CoV-2 RNA  is generally detectable in upper and lower  respiratory specimens dur ing the acute phase of infection.  Positive  results are indicative of active infection with SARS-CoV-2.  Clinical  correlation with patient history and other diagnostic information is  necessary to determine patient infection status.  Positive results do  not rule out bacterial infection or co-infection with other viruses. If result is PRESUMPTIVE POSTIVE SARS-CoV-2 nucleic acids MAY BE PRESENT.   A presumptive positive result was obtained on the submitted specimen  and confirmed on repeat testing.  While 2019 novel coronavirus  (SARS-CoV-2) nucleic acids may be present in the submitted sample  additional confirmatory testing may be necessary for epidemiological  and / or clinical management purposes  to differentiate between  SARS-CoV-2 and other Sarbecovirus currently known to infect humans.  If clinically indicated additional testing with an alternate test  methodology [phone]) is advised. The SARS-CoV-2 RNA is generally  detectable in upper and lower respiratory sp ecimens during the acute  phase of infection. The expected result is Negative. Fact Sheet for Patients:  StrictlyIdeas.no Fact Sheet for Healthcare Providers: BankingDealers.co.za This test is not yet approved or cleared by the [place] FDA and has been authorized for detection and/or diagnosis of SARS-CoV-2 by FDA under an Emergency Use Authorization (EUA).  This EUA will remain in effect (meaning this test can be used) for the duration of the COVID-19 declaration under Section 564(b)(1) of the Act, 21 U.S.C. section 360bbb-3(b)(1), unless the authorization is terminated or revoked sooner. Performed at [facility], [address], [place], [place] [address]      Radiologic Imaging: US Renal  Result Date: 05/04/2019 CLINICAL DATA:  Acute renal injury EXAM: RENAL / URINARY TRACT  ULTRASOUND COMPLETE COMPARISON:  03/18/2019 CT of the abdomen and pelvis. FINDINGS: Right Kidney: Renal measurements: 15.2 x 8.0 x 7.5 cm. = volume: 475 mL. Mild hydronephrosis is noted. Previously seen renal stone is not well appreciated and may have migrated into the right ureter. 3 cm cyst is noted similar to that seen on prior CT examination. Left Kidney: Renal measurements: 13.6 x 7.3 x 8.0 cm = volume: 417 mL. Severe longstanding hydronephrosis is noted with cortical thinning similar to that seen on prior CT examination. Bladder:  Decompressed by suprapubic catheter IMPRESSION: Chronic hydronephrosis on the left with near complete loss of cortex. Mild hydronephrosis on the right. Previously seen renal calculus is not well appreciated and may have migrated into the ureter. CT urogram may be helpful. Right renal cyst stable from the prior CT. Electronically Signed   By: Inez Catalina M.D.   On: 05/04/2019 22:14   Dg Chest Port 1 View  Result Date: 05/04/2019 CLINICAL DATA:  Blood in catheterization bag. EXAM: PORTABLE CHEST 1 VIEW COMPARISON:  March 18, 2019 FINDINGS: Stable pacemaker. Stable cardiomegaly. The hila, mediastinum, lungs, and pleura are otherwise unremarkable. IMPRESSION: No acute abnormalities. Electronically Signed   By: Dorise Bullion III M.D   On: 05/04/2019 18:23    Impression:   - Chronic urinary retention Multifactorial secondary to BPH and prior CVA.  He is managed by SP tube drainage.  - Acute renal failure Felt secondary to sepsis and recent IV contrast for CT performed late June 2020.  Recommendation:   - Suprapubic tube was exchanged on 05/01/2019 and does not need further management  - Rising creatinine felt secondary to ATN from sepsis and prior IV contrast.  Patient under hospice care and is a DNR.  Depending on family desires if creatinine continues to rise could consider a noncontrast CT of the abdomen pelvis to evaluate for worsening hydronephrosis.  - Will sign  off.  Please reconsult if needed   05/06/2019, 10:50 AM  John Giovanni,  MD

## 2019-05-06 NOTE — Progress Notes (Signed)
[place] [facility] [place] NAME: Michael Bush    MR#:  [id number]  DATE OF BIRTH:  1940/11/28  SUBJECTIVE:  CHIEF COMPLAINT:   Chief Complaint  Patient presents with  . Altered Mental Status   The patient looks better, but still oliguria. REVIEW OF SYSTEMS:  Review of Systems  Unable to perform ROS: Mental status change    DRUG ALLERGIES:   Allergies  Allergen Reactions  . Vancomycin Swelling    Facial swelling  . Dabigatran Other (See Comments)    Bleeding complications Bleeding complications   . Pollen Extract   . Pradaxa [Dabigatran Etexilate Mesylate]    VITALS:  Blood pressure (!) 144/67, pulse 70, temperature (!) 97.4 F (36.3 C), temperature source Oral, resp. rate 20, height 5\' 9"  (1.753 m), weight 80.3 kg, SpO2 95 %. PHYSICAL EXAMINATION:  Physical Exam Constitutional:      General: He is not in acute distress. HENT:     Head: Normocephalic.  Eyes:     General: No scleral icterus.    Conjunctiva/sclera: Conjunctivae normal.     Pupils: Pupils are equal, round, and reactive to light.  Neck:     Musculoskeletal: Neck supple.     Vascular: No JVD.     Trachea: No tracheal deviation.  Cardiovascular:     Rate and Rhythm: Normal rate and regular rhythm.     Heart sounds: Normal heart sounds. No murmur. No gallop.   Pulmonary:     Effort: Pulmonary effort is normal. No respiratory distress.     Breath sounds: Normal breath sounds. No wheezing or rales.  Abdominal:     General: Bowel sounds are normal. There is no distension.     Palpations: Abdomen is soft.     Tenderness: There is no abdominal tenderness. There is no rebound.  Musculoskeletal:     Right lower leg: No edema.     Left lower leg: No edema.  Skin:    Findings: No erythema or rash.  Neurological:     Comments: Unable to exam  Psychiatric:     Comments: noncommunicative    LABORATORY PANEL:  Male CBC Recent Labs  Lab 05/05/19 0102   WBC 14.0*  HGB 12.5*  HCT 38.6*  PLT 163   ------------------------------------------------------------------------------------------------------------------ Chemistries  Recent Labs  Lab 05/04/19 1725  05/06/19 0615  NA 140   < > 143  K 4.6   < > 5.5*  CL 102   < > 111  CO2 23   < > 20*  GLUCOSE 334*   < > 163*  BUN 35*   < > 68*  CREATININE 3.30*   < > 6.10*  CALCIUM 9.2   < > 8.1*  AST 23  --   --   ALT 18  --   --   ALKPHOS 74  --   --   BILITOT 1.2  --   --    < > = values in this interval not displayed.   RADIOLOGY:  No results found. ASSESSMENT AND PLAN:   78 y.o. 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  1.  Sepsis -likely secondary to UTI.  Patient presenting with fever decreased urine output, confusion with elevated lactic acid 5.5 and leukocytosis meeting sepsis criteria.  Treated with IV fluid bolus per sepsis protocol -Chest x-ray reviewed shows no acute abnormality -Blood cultures pending -UA positive for UTI -Urine cultures: MULTIPLE SPECIES PRESENT, SUGGEST RECOLLECTIONAbnormal  Continue ceftriaxone.   2. 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 -Likely prerenal, worsening -Holding nephrotoxins Continue IV fluids Renal ultrasound did not report obstruction.  The patient's wife does not want hemodialysis for the patient. Nephrology consult.  Hyperkalemia. Due to ARF on CKD.  Potassium 5.5, continue Lokelma.  Lactic acidosis. Continue above treatment.  Improved.  Acute metabolic encephalopathy due to above.  Aspiration precaution.  3. Atrial  fibrillation -on Coumadin  4. Diabetes mellitus type 2 - Hold glipizide in the setting of AKI Continue sliding scale  5. Hyperlipidemia -Atorvastatin 40 mg q. 1800  6. History of CVA with residual left-sided hemiplegia - On Coumadin for thrombosis -PT/INR supratherapeutic. Pharmacy to dose  7. Hypertension - Stable  8. Urinary retention-managed by SPT with planned exchange on 06/01/2019 Per Dr. Bernardo Heater, Suprapubic tube was exchanged on 05/01/2019 and does not need further management; Patient under hospice care and is a DNR.  Depending on family desires  if creatinine continues to rise could consider a noncontrast CT of the abdomen pelvis to evaluate for worsening hydronephrosis.  His wife understand the patient's condition is worsening.  The patient was in hospice care at home.  She prefers hospice care at home on discharge or if patient get worse. All the records are reviewed and case discussed with Care Management/Social Worker. Management plans discussed with the patient, his wife and they are in agreement.  CODE STATUS: DNR  TOTAL TIME TAKING CARE OF THIS PATIENT: 33 minutes.   More than 50% of the time was spent in counseling/coordination of care: YES  POSSIBLE D/C IN ? DAYS, DEPENDING ON CLINICAL CONDITION.   Demetrios Loll M.D on 05/06/2019 at 11:45 AM  Between 7am to 6pm - Pager - [phone]  After 6pm go to [email or web address] - [facility]

## 2019-05-06 NOTE — Progress Notes (Addendum)
ANTICOAGULATION CONSULT NOTE -   Pharmacy Consult for Warfarin Indication: atrial fibrillation (home medication)  Allergies  Allergen Reactions  . Vancomycin Swelling    Facial swelling  . Dabigatran Other (See Comments)    Bleeding complications Bleeding complications   . Pollen Extract   . Pradaxa [Dabigatran Etexilate Mesylate]    Patient Measurements: Height: 5\' 9"  (175.3 cm) Weight: 177 lb 0.5 oz (80.3 kg) IBW/kg (Calculated) : 70.7  Vital Signs: Temp: 97.4 F (36.3 C) (08/09 0433) Temp Source: Oral (08/09 0433) BP: 144/67 (08/09 0433) Pulse Rate: 70 (08/09 0433)  Labs: Recent Labs    05/04/19 1725 05/05/19 0102 05/05/19 1055 05/06/19 0615  HGB 14.8 12.5*  --   --   HCT 45.4 38.6*  --   --   PLT 214 163  --   --   APTT  --  50*  --   --   LABPROT 23.5*  --  29.8* 40.7*  INR 2.1*  --  2.9* 4.3*  CREATININE 3.30* 3.81* 4.81* 6.10*   Estimated Creatinine Clearance: 9.8 mL/min (A) (by C-G formula based on SCr of 6.1 mg/dL (H)).  Medical History: Past Medical History:  Diagnosis Date  . Arthritis   . CHF (congestive heart failure) ([place])   . Chronic kidney disease    chronic kidney disease  . Colon cancer ([place]) 2003   Hx Partial colon resection, chemo + rad tx's.  Marland Kitchen COPD (chronic obstructive pulmonary disease) ([place])   . Coronary artery disease   . Diabetes mellitus without complication ([place])   . Dysrhythmia    atrial fib., bradycardia  . GERD (gastroesophageal reflux disease)   . Hypertension   . Hypothyroidism   . Long term (current) use of anticoagulants   . Mixed hearing loss, unilateral    03/07/14  . Myocardial infarction ([place])   . Presence of permanent cardiac pacemaker    [employer] DDDR [phone]  . Pseudophakia of both eyes   . Sick sinus syndrome ([place]) 04/26/2014  . Stroke [facility])    Medications:  Medications Prior to Admission  Medication Sig Dispense Refill Last Dose  . atorvastatin (LIPITOR) 40 MG tablet Take 40 mg by  mouth daily at 6 PM.   3 Past Week at Unknown time  . B Complex Vitamins (VITAMIN B COMPLEX PO) Take 2 tablets by mouth daily.   Past Week at Unknown time  . Calcium Carb-Ergocalciferol 500-200 MG-UNIT TABS Take 1 tablet by mouth daily.    Past Week at Unknown time  . glipiZIDE (GLUCOTROL) 5 MG tablet Take 5 mg by mouth 2 (two) times daily.   2 Past Week at Unknown time  . Omega-3 Fatty Acids (KP FISH OIL) 1200 MG CAPS Take by mouth.   Past Week at Unknown time  . oxyCODONE (OXY IR/ROXICODONE) 5 MG immediate release tablet Take 5 mg by mouth at bedtime as needed for severe pain.   Past Week at Unknown time  . sertraline (ZOLOFT) 25 MG tablet Take 25 mg by mouth daily.    Past Week at Unknown time  . tamsulosin (FLOMAX) 0.4 MG CAPS capsule Take 1 capsule (0.4 mg total) by mouth daily. 30 capsule 11 Past Week at Unknown time  . tiZANidine (ZANAFLEX) 2 MG tablet Take 2 mg by mouth at bedtime.    Past Week at Unknown time  . traZODone (DESYREL) 50 MG tablet Take 50 mg by mouth at bedtime.    Past Week at Unknown time  . vitamin  B-12 (CYANOCOBALAMIN) 1000 MCG tablet Take 1,000 mcg by mouth daily.    Past Week at Unknown time  . warfarin (COUMADIN) 3 MG tablet Take 2.5-3 mg by mouth daily at 6 PM. Take 3 mg by mouth on Sunday, Monday and Friday and 2.5 mg on Tuesday, Wednesday, Thursday and Saturday   Past Week at Unknown time  . acetaminophen (TYLENOL) 325 MG tablet Take 650 mg by mouth every 6 (six) hours as needed for mild pain or moderate pain.    prn at prn  . albuterol (PROVENTIL HFA;VENTOLIN HFA) 108 (90 Base) MCG/ACT inhaler Inhale 2 puffs into the lungs every 6 (six) hours as needed. 1 Inhaler 0 prn at prn  . ciprofloxacin (CIPRO) 250 MG tablet Take 1 tablet (250 mg total) by mouth 2 (two) times daily. (Patient not taking: Reported on 05/04/2019) 14 tablet 0 Completed Course at Unknown time  . citalopram (CELEXA) 20 MG tablet Take 20 mg by mouth daily.     Marland Kitchen doxycycline (VIBRAMYCIN) 100 MG capsule  Take 1 capsule (100 mg total) by mouth 2 (two) times daily. (Patient not taking: Reported on 05/04/2019) 20 capsule 0 Completed Course at Unknown time  . famotidine (PEPCID) 20 MG tablet Take 20 mg by mouth 2 (two) times daily.      Marland Kitchen ketotifen (ZADITOR) 0.025 % ophthalmic solution INT 1 GTT IN OU BID FOR ALLERGIES OR ITCHING     . lactulose (CEPHULAC) 20 g packet Take 1 packet (20 g total) by mouth daily as needed (constipation). (Patient not taking: Reported on 05/04/2019) 5 each 0 Completed Course at Unknown time  . nitroGLYCERIN (NITROSTAT) 0.4 MG SL tablet Place 1 tablet (0.4 mg total) under the tongue every 5 (five) minutes as needed for chest pain. 30 tablet 0 prn at prn  . sulfamethoxazole-trimethoprim (BACTRIM DS) 800-160 MG tablet Take 1 tablet by mouth every 12 (twelve) hours. (Patient not taking: Reported on 05/04/2019) 14 tablet 0 Completed Course at Unknown time  . traMADol (ULTRAM) 50 MG tablet Take 1 tablet (50 mg total) by mouth every 6 (six) hours as needed. (Patient not taking: Reported on 05/04/2019) 15 tablet 0 Completed Course at Unknown time   Assessment: 78yo male on chronic Warfarin PTA.  INR 2.1 today when admitted.  Pt has had some vomiting and is now acutely ill.  Home dose listed as 3mg  on Sun, Mon, and Fri and 2.5mg  all other days.  Will give reduced dose of 2.5mg  tonight given acute illness and noted vomiting.  CKD4- per nephrology note- Due to multiple comorbidities, patient would not be a candidate for hemodialysis.  Home Regimen: Warfarin 3mg  Sun, Mon, Fri                              Warfarin  2.5mg  Tues, Weds, Thurs, Sat  DATE INR DOSE 8/7 2.1 2.5 mg 8/8  2.9 2 mg  8/9 4.3 Hold dose  Goal of Therapy:  INR 2-3 Monitor platelets by anticoagulation protocol: Yes   Plan:  INR elevated. Will hold dose today. On abx. Lokelma can increase Warfarin concentration. F/u PT/INR/CBC tomorrow  Delani Kohli A 05/06/2019,9:25 AM

## 2019-05-07 LAB — GLUCOSE, CAPILLARY
Glucose-Capillary: 139 mg/dL — ABNORMAL HIGH (ref 70–99)
Glucose-Capillary: 120 mg/dL — ABNORMAL HIGH (ref 70–99)
Glucose-Capillary: 122 mg/dL — ABNORMAL HIGH (ref 70–99)
Glucose-Capillary: 141 mg/dL — ABNORMAL HIGH (ref 70–99)
Glucose-Capillary: 120 mg/dL — ABNORMAL HIGH (ref 70–99)

## 2019-05-07 LAB — PROTIME-INR
INR: 5.7 (ref 0.8–1.2)
Prothrombin Time: 50.3 seconds — ABNORMAL HIGH (ref 11.4–15.2)

## 2019-05-07 LAB — BASIC METABOLIC PANEL
Anion gap: 13 (ref 5–15)
BUN: 85 mg/dL — ABNORMAL HIGH (ref 8–23)
CO2: 19 mmol/L — ABNORMAL LOW (ref 22–32)
Calcium: 8.2 mg/dL — ABNORMAL LOW (ref 8.9–10.3)
Chloride: 110 mmol/L (ref 98–111)
Creatinine, Ser: 7.74 mg/dL — ABNORMAL HIGH (ref 0.61–1.24)
GFR calc Af Amer: 7 mL/min — ABNORMAL LOW (ref >60)
GFR calc non Af Amer: 6 mL/min — ABNORMAL LOW (ref >60)
Glucose, Bld: 136 mg/dL — ABNORMAL HIGH (ref 70–99)
Potassium: 5.5 mmol/L — ABNORMAL HIGH (ref 3.5–5.1)
Sodium: 142 mmol/L (ref 135–145)

## 2019-05-07 LAB — CBC
HCT: 38.4 % — ABNORMAL LOW (ref 39.0–52.0)
Hemoglobin: 12.2 g/dL — ABNORMAL LOW (ref 13.0–17.0)
MCH: 28 pg (ref 26.0–34.0)
MCHC: 31.8 g/dL (ref 30.0–36.0)
MCV: 88.3 fL (ref 80.0–100.0)
Platelets: 144 10*3/uL — ABNORMAL LOW (ref 150–400)
RBC: 4.35 MIL/uL (ref 4.22–5.81)
RDW: 14.4 % (ref 11.5–15.5)
WBC: 7.4 10*3/uL (ref 4.0–10.5)
nRBC: 0 % (ref 0.0–0.2)

## 2019-05-07 MED ORDER — SODIUM CHLORIDE 0.9 % IV SOLN
INTRAVENOUS | Status: DC
  Administered 2019-05-07: 22:00:00 via INTRAVENOUS

## 2019-05-07 NOTE — Progress Notes (Signed)
Spoke with Dr. Marcille Blanco about critical INR of 5.7. no new orders at this time. No signs of bleeding from patient. Terrial Rhodes

## 2019-05-07 NOTE — Progress Notes (Signed)
Visit made. Patient is currently followed by [facility] at home with a hospice diagnosis of hemiplegia following a CVA . He is a DNR with an out of facility DNR in place in the home. Patient was sent to the [facility] ED via EMS on 8/7 for evaluation of nausea/vomiting, unrelieved by medications given at home as well as fever. COVID 19 negative. Patient was admitted with sepsis and UTI. He has been receiving IV fluids and antibiotics. Per chart note review patient's remaining kidney has continued with worsening  function,only minimal urine output. Patient seen lying in bed, eyes open, wife [name] at bedside. Discussed reason for admission and developments from the weekend. Nephrologist Dr. Holley Raring in during visit and reports patient's kidney is now only functioning at 10%. Patient does not want dialysis, which he voiced again today. [name] and patient both clear that the plan would be to return home with continued hospice services. Patient has required a dose of oral tylenol this morning for pain. Per [name] pain is being managed at home with oxycodone IR 5 mg. He is eating only bites and sips. Speech therapy has recommended a dysphasia II diet with thin liquids. Patient did have some coughing when drinking,  noted during visit.  Education and emotional support provided. Patient resting with eyes closed at end of visit. Plan is for discharge home via EMS tomorrow. Hospital team updated. Will continue to follow and update hospice team. Flo Shanks BSN, RN, [place] [facility] [phone]

## 2019-05-07 NOTE — Progress Notes (Signed)
[place], [place] 05/07/19  Subjective:  Patient seen at bedside with hospice and patient's wife. Patient states that he would not want to be on dialysis and patient's wife confirms this as well.    Objective:  Vital signs in last 24 hours:  Temp:  [98.4 F (36.9 C)-99.6 F (37.6 C)] 98.4 F (36.9 C) (08/10 0950) Pulse Rate:  [58-62] 58 (08/10 0950) Resp:  [18-20] 20 (08/10 0950) BP: (137-159)/(66-81) 137/66 (08/10 0950) SpO2:  [93 %-96 %] 93 % (08/10 0950)  Weight change:  Filed Weights   05/04/19 1721 05/04/19 2229  Weight: 79.8 kg 80.3 kg    Intake/Output:    Intake/Output Summary (Last 24 hours) at 05/07/2019 1138 Last data filed at 05/07/2019 1015 Gross per 24 hour  Intake 1115.24 ml  Output 0 ml  Net 1115.24 ml   Physical Exam: General: Chronically ill-appearing gentleman, laying in the bed  HEENT Moist oral mucous membranes  Neck: Supple  Lungs: Normal breathing effort, rales at bases  Heart:: Irregular  Abdomen: Soft, nontender, suprapubic catheter in place  Extremities: Trace edema  Neurologic: Slurred speech due to stroke, awake, alert  Skin: Dry    Basic Metabolic Panel:  Recent Labs  Lab 05/04/19 1725 05/05/19 0102 05/05/19 1055 05/06/19 0615 05/07/19 0538  NA 140 139 139 143 142  K 4.6 4.9 5.3* 5.5* 5.5*  CL 102 108 106 111 110  CO2 23 21* 21* 20* 19*  GLUCOSE 334* 288* 262* 163* 136*  BUN 35* 39* 50* 68* 85*  CREATININE 3.30* 3.81* 4.81* 6.10* 7.74*  CALCIUM 9.2 8.1* 8.4* 8.1* 8.2*     CBC: Recent Labs  Lab 05/04/19 1725 05/05/19 0102 05/07/19 0538  WBC 18.4* 14.0* 7.4  NEUTROABS 16.8*  --   --   HGB 14.8 12.5* 12.2*  HCT 45.4 38.6* 38.4*  MCV 88.2 88.5 88.3  PLT 214 163 144*     No results found for: HEPBSAG, HEPBSAB, HEPBIGM    Microbiology:  Recent Results (from the past 240 hour(s))  Urine culture     Status: Abnormal   Collection Time: 05/04/19  5:25 PM   Specimen: Urine, Random   Result Value Ref Range Status   Specimen Description   Final    URINE, RANDOM Performed at [facility], [address] [place], [place] [address]    Special Requests   Final    NONE Performed at [facility], [address], [place], [place] [address]    Culture MULTIPLE SPECIES PRESENT, [place] (A)  Final   Report Status 05/06/2019 FINAL  Final  SARS Coronavirus 2 [facility] order, Performed in [facility] hospital lab) Nasopharyngeal Nasopharyngeal Swab     Status: None   Collection Time: 05/04/19  6:50 PM   Specimen: Nasopharyngeal Swab  Result Value Ref Range Status   SARS Coronavirus 2 NEGATIVE NEGATIVE Final    Comment: (NOTE) If result is NEGATIVE SARS-CoV-2 target nucleic acids are NOT DETECTED. The SARS-CoV-2 RNA is generally detectable in upper and lower  respiratory specimens during the acute phase of infection. The lowest  concentration of SARS-CoV-2 viral copies this assay can detect is 250  copies / mL. A negative result does not preclude SARS-CoV-2 infection  and should not be used as the sole basis for treatment or other  patient management decisions.  A negative result may occur with  improper specimen collection / handling, submission of specimen other  than nasopharyngeal swab, presence of viral mutation(s)  within the  areas targeted by this assay, and inadequate number of viral copies  (<250 copies / mL). A negative result must be combined with clinical  observations, patient history, and epidemiological information. If result is POSITIVE SARS-CoV-2 target nucleic acids are DETECTED. The SARS-CoV-2 RNA is generally detectable in upper and lower  respiratory specimens dur ing the acute phase of infection.  Positive  results are indicative of active infection with SARS-CoV-2.  Clinical  correlation with patient history and other diagnostic information is  necessary to determine patient infection status.  Positive results do   not rule out bacterial infection or co-infection with other viruses. If result is PRESUMPTIVE POSTIVE SARS-CoV-2 nucleic acids MAY BE PRESENT.   A presumptive positive result was obtained on the submitted specimen  and confirmed on repeat testing.  While 2019 novel coronavirus  (SARS-CoV-2) nucleic acids may be present in the submitted sample  additional confirmatory testing may be necessary for epidemiological  and / or clinical management purposes  to differentiate between  SARS-CoV-2 and other Sarbecovirus currently known to infect humans.  If clinically indicated additional testing with an alternate test  methodology [phone]) is advised. The SARS-CoV-2 RNA is generally  detectable in upper and lower respiratory sp ecimens during the acute  phase of infection. The expected result is Negative. Fact Sheet for Patients:  StrictlyIdeas.no Fact Sheet for Healthcare Providers: BankingDealers.co.za This test is not yet approved or cleared by the [place] FDA and has been authorized for detection and/or diagnosis of SARS-CoV-2 by FDA under an Emergency Use Authorization (EUA).  This EUA will remain in effect (meaning this test can be used) for the duration of the COVID-19 declaration under Section 564(b)(1) of the Act, 21 U.S.C. section 360bbb-3(b)(1), unless the authorization is terminated or revoked sooner. Performed at [facility], [address] [place], [place] [address]     Coagulation Studies: Recent Labs    05/04/19 1725 05/05/19 1055 05/06/19 0615 05/07/19 0538  LABPROT 23.5* 29.8* 40.7* 50.3*  INR 2.1* 2.9* 4.3* 5.7*    Urinalysis: Recent Labs    05/04/19 1725  COLORURINE AMBER*  LABSPEC 1.018  PHURINE 5.0  GLUCOSEU >=500*  HGBUR LARGE*  BILIRUBINUR NEGATIVE  KETONESUR NEGATIVE  PROTEINUR 100*  NITRITE POSITIVE*  LEUKOCYTESUR SMALL*      Imaging: No results found.   Medications:   .  cefTRIAXone (ROCEPHIN)  IV Stopped (05/06/19 1803)   . atorvastatin  40 mg Oral q1800  . famotidine  20 mg Oral Daily  . feeding supplement (NEPRO CARB STEADY)  237 mL Oral BID BM  . insulin aspart  0-5 Units Subcutaneous QHS  . insulin aspart  0-9 Units Subcutaneous TID WC  . sertraline  25 mg Oral Daily  . sodium zirconium cyclosilicate  10 g Oral Daily  . tamsulosin  0.4 mg Oral Daily  . traZODone  50 mg Oral QHS  . vitamin B-12  1,000 mcg Oral Daily  . Warfarin - Pharmacist Dosing Inpatient   Does not apply q1800   acetaminophen, alum & mag hydroxide-simeth, nitroGLYCERIN, oxyCODONE  Assessment/ Plan:  78 y.o. Caucasian male with Multiple chronic conditions including congestive heart failure, COPD, coronary disease, diabetes, atrial fibrillation, hypothyroidism, hearing loss, history of cardiac pacemaker placement, stroke, requiring suprapubic catheter, recent multiple UTIs, was admitted on 05/04/2019 with Fever, altered mental status.   1.  Acute kidney injury. Likely ATN. Multifactorial with contribution from concurrent UTI, IV contrast exposure on June 21 Renal function worsening.  BUN  up to 85 with a creatinine of 7.7.  This was discussed with the patient, his wife, and hospice representatives.  At this point in time family opts for comfort care.  This certainly appears reasonable given multiple comorbidities and the patient.  Therefore we will not be proceeding with dialysis at this time.      LOS: 3 Allyn Bartelson 8/10/202011:38 AM  [place], [place]  Note: This note was prepared with Dragon dictation. Any transcription errors are unintentional

## 2019-05-07 NOTE — Care Management Important Message (Signed)
Important Message  Patient Details  Name: Bryker Fletchall MRN: [id number] Date of Birth: [birth year]   Medicare Important Message Given:  Yes     Juliann Pulse A Terisha Losasso 05/07/2019, 2:21 PM

## 2019-05-07 NOTE — Progress Notes (Signed)
[place] [facility] [place] NAME: Michael Bush    MR#:  [id number]  DATE OF BIRTH:  Oct 19, 1940  SUBJECTIVE:  CHIEF COMPLAINT:   Chief Complaint  Patient presents with  . Altered Mental Status   The patient is drowsy, still oliguria. REVIEW OF SYSTEMS:  Review of Systems  Unable to perform ROS: Mental status change    DRUG ALLERGIES:   Allergies  Allergen Reactions  . Vancomycin Swelling    Facial swelling  . Dabigatran Other (See Comments)    Bleeding complications Bleeding complications   . Pollen Extract   . Pradaxa [Dabigatran Etexilate Mesylate]    VITALS:  Blood pressure 137/66, pulse (!) 58, temperature 98.4 F (36.9 C), temperature source Oral, resp. rate 20, height 5\' 9"  (1.753 m), weight 80.3 kg, SpO2 93 %. PHYSICAL EXAMINATION:  Physical Exam Constitutional:      General: He is not in acute distress. HENT:     Head: Normocephalic.  Eyes:     General: No scleral icterus.    Conjunctiva/sclera: Conjunctivae normal.     Pupils: Pupils are equal, round, and reactive to light.  Neck:     Musculoskeletal: Neck supple.     Vascular: No JVD.     Trachea: No tracheal deviation.  Cardiovascular:     Rate and Rhythm: Normal rate and regular rhythm.     Heart sounds: Normal heart sounds. No murmur. No gallop.   Pulmonary:     Effort: Pulmonary effort is normal. No respiratory distress.     Breath sounds: Normal breath sounds. No wheezing or rales.  Abdominal:     General: Bowel sounds are normal. There is no distension.     Palpations: Abdomen is soft.     Tenderness: There is no abdominal tenderness. There is no rebound.  Musculoskeletal:     Right lower leg: No edema.     Left lower leg: No edema.  Skin:    Findings: No erythema or rash.  Neurological:     Comments: Unable to exam  Psychiatric:     Comments: noncommunicative    LABORATORY PANEL:  Male CBC Recent Labs  Lab 05/07/19 0538  WBC 7.4  HGB  12.2*  HCT 38.4*  PLT 144*   ------------------------------------------------------------------------------------------------------------------ Chemistries  Recent Labs  Lab 05/04/19 1725  05/07/19 0538  NA 140   < > 142  K 4.6   < > 5.5*  CL 102   < > 110  CO2 23   < > 19*  GLUCOSE 334*   < > 136*  BUN 35*   < > 85*  CREATININE 3.30*   < > 7.74*  CALCIUM 9.2   < > 8.2*  AST 23  --   --   ALT 18  --   --   ALKPHOS 74  --   --   BILITOT 1.2  --   --    < > = values in this interval not displayed.   RADIOLOGY:  No results found. ASSESSMENT AND PLAN:   78 y.o. 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  1. Sepsis -likely  secondary to UTI.  Patient presenting with fever decreased urine output, confusion with elevated lactic acid 5.5 and leukocytosis meeting sepsis criteria.  Treated with IV fluid bolus per sepsis protocol -Chest x-ray reviewed shows no acute abnormality -Blood cultures pending -UA positive for UTI -Urine cultures: MULTIPLE SPECIES PRESENT, SUGGEST RECOLLECTIONAbnormal  Continue ceftriaxone.   2. 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 -Likely prerenal, worsening -Holding nephrotoxins Renal function has been worsening despite of IV fluids Renal ultrasound did not report obstruction.  The patient's wife does not want hemodialysis for the patient.  She prefers hospice care.  Hyperkalemia. Due to ARF on CKD.  Potassium 5.5, continue Lokelma.  Lactic acidosis. Continue above treatment.  Improved.  Acute metabolic encephalopathy due to above.  Aspiration  precaution.  3. Atrial fibrillation -hold Coumadin due to supratherapeutic INR.  4. Diabetes mellitus type 2 - Hold glipizide in the setting of AKI Continue sliding scale  5. Hyperlipidemia -Atorvastatin 40 mg q. 1800  6. History of CVA with residual left-sided hemiplegia - On Coumadin for thrombosis -PT/INR supratherapeutic. Pharmacy to dose  7. Hypertension - Stable  8. Urinary retention-managed by SPT with planned exchange on 06/01/2019 Per Dr. Bernardo Heater, Suprapubic tube was exchanged on 05/01/2019 and does not need further management; Patient under hospice care and is a DNR.  Depending on family desires  if creatinine continues to rise could consider a noncontrast CT of the abdomen pelvis to evaluate for worsening hydronephrosis.  Discussed with Dr. Holley Raring. His wife understand the patient's condition is worsening.  The patient was in hospice care at home.  She prefers hospice care at home on discharge. All the records are reviewed and case discussed with Care Management/Social Worker. Management plans discussed with the patient, his wife and they are in agreement.  CODE STATUS: DNR  TOTAL TIME TAKING CARE OF THIS PATIENT: 33 minutes.   More than 50% of the time was spent in counseling/coordination of care: YES  POSSIBLE D/C IN 1 DAYS, DEPENDING ON CLINICAL CONDITION.   Demetrios Loll M.D on 05/07/2019 at 3:22 PM  Between 7am to 6pm - Pager - [phone]  After 6pm go to [email or web address] - [facility]

## 2019-05-07 NOTE — Progress Notes (Signed)
ANTICOAGULATION CONSULT NOTE -   Pharmacy Consult for Warfarin Indication: atrial fibrillation (home medication)  Patient Measurements: Height: 5\' 9"  (175.3 cm) Weight: 177 lb 0.5 oz (80.3 kg) IBW/kg (Calculated) : 70.7  Vital Signs: Temp: 99.6 F (37.6 C) (08/10 0516) Temp Source: Oral (08/10 0516) BP: 159/75 (08/10 0516) Pulse Rate: 62 (08/10 0516)  Labs: Recent Labs    05/04/19 1725 05/05/19 0102 05/05/19 1055 05/06/19 0615 05/07/19 0538  HGB 14.8 12.5*  --   --  12.2*  HCT 45.4 38.6*  --   --  38.4*  PLT 214 163  --   --  144*  APTT  --  50*  --   --   --   LABPROT 23.5*  --  29.8* 40.7* 50.3*  INR 2.1*  --  2.9* 4.3* 5.7*  CREATININE 3.30* 3.81* 4.81* 6.10* 7.74*   Estimated Creatinine Clearance: 7.7 mL/min (A) (by C-G formula based on SCr of 7.74 mg/dL (H)).  Medical History: Past Medical History:  Diagnosis Date  . Arthritis   . CHF (congestive heart failure) ([place])   . Chronic kidney disease    chronic kidney disease  . Colon cancer ([place]) 2003   Hx Partial colon resection, chemo + rad tx's.  Marland Kitchen COPD (chronic obstructive pulmonary disease) ([place])   . Coronary artery disease   . Diabetes mellitus without complication ([place])   . Dysrhythmia    atrial fib., bradycardia  . GERD (gastroesophageal reflux disease)   . Hypertension   . Hypothyroidism   . Long term (current) use of anticoagulants   . Mixed hearing loss, unilateral    03/07/14  . Myocardial infarction ([place])   . Presence of permanent cardiac pacemaker    [employer] Accolade DDDR [phone]  . Pseudophakia of both eyes   . Sick sinus syndrome ([place]) 04/26/2014  . Stroke [facility])    Medications:  Medications Prior to Admission  Medication Sig Dispense Refill Last Dose  . atorvastatin (LIPITOR) 40 MG tablet Take 40 mg by mouth daily at 6 PM.   3 Past Week at Unknown time  . B Complex Vitamins (VITAMIN B COMPLEX PO) Take 2 tablets by mouth daily.   Past Week at Unknown time  . Calcium  Carb-Ergocalciferol 500-200 MG-UNIT TABS Take 1 tablet by mouth daily.    Past Week at Unknown time  . glipiZIDE (GLUCOTROL) 5 MG tablet Take 5 mg by mouth 2 (two) times daily.   2 Past Week at Unknown time  . Omega-3 Fatty Acids (KP FISH OIL) 1200 MG CAPS Take by mouth.   Past Week at Unknown time  . oxyCODONE (OXY IR/ROXICODONE) 5 MG immediate release tablet Take 5 mg by mouth at bedtime as needed for severe pain.   Past Week at Unknown time  . sertraline (ZOLOFT) 25 MG tablet Take 25 mg by mouth daily.    Past Week at Unknown time  . tamsulosin (FLOMAX) 0.4 MG CAPS capsule Take 1 capsule (0.4 mg total) by mouth daily. 30 capsule 11 Past Week at Unknown time  . tiZANidine (ZANAFLEX) 2 MG tablet Take 2 mg by mouth at bedtime.    Past Week at Unknown time  . traZODone (DESYREL) 50 MG tablet Take 50 mg by mouth at bedtime.    Past Week at Unknown time  . vitamin B-12 (CYANOCOBALAMIN) 1000 MCG tablet Take 1,000 mcg by mouth daily.    Past Week at Unknown time  . warfarin (COUMADIN) 3 MG tablet Take 2.5-3 mg by  mouth daily at 6 PM. Take 3 mg by mouth on Sunday, Monday and Friday and 2.5 mg on Tuesday, Wednesday, Thursday and Saturday   Past Week at Unknown time  . acetaminophen (TYLENOL) 325 MG tablet Take 650 mg by mouth every 6 (six) hours as needed for mild pain or moderate pain.    prn at prn  . albuterol (PROVENTIL HFA;VENTOLIN HFA) 108 (90 Base) MCG/ACT inhaler Inhale 2 puffs into the lungs every 6 (six) hours as needed. 1 Inhaler 0 prn at prn  . ciprofloxacin (CIPRO) 250 MG tablet Take 1 tablet (250 mg total) by mouth 2 (two) times daily. (Patient not taking: Reported on 05/04/2019) 14 tablet 0 Completed Course at Unknown time  . citalopram (CELEXA) 20 MG tablet Take 20 mg by mouth daily.     Marland Kitchen doxycycline (VIBRAMYCIN) 100 MG capsule Take 1 capsule (100 mg total) by mouth 2 (two) times daily. (Patient not taking: Reported on 05/04/2019) 20 capsule 0 Completed Course at Unknown time  . famotidine  (PEPCID) 20 MG tablet Take 20 mg by mouth 2 (two) times daily.      Marland Kitchen ketotifen (ZADITOR) 0.025 % ophthalmic solution INT 1 GTT IN OU BID FOR ALLERGIES OR ITCHING     . lactulose (CEPHULAC) 20 g packet Take 1 packet (20 g total) by mouth daily as needed (constipation). (Patient not taking: Reported on 05/04/2019) 5 each 0 Completed Course at Unknown time  . nitroGLYCERIN (NITROSTAT) 0.4 MG SL tablet Place 1 tablet (0.4 mg total) under the tongue every 5 (five) minutes as needed for chest pain. 30 tablet 0 prn at prn  . sulfamethoxazole-trimethoprim (BACTRIM DS) 800-160 MG tablet Take 1 tablet by mouth every 12 (twelve) hours. (Patient not taking: Reported on 05/04/2019) 14 tablet 0 Completed Course at Unknown time  . traMADol (ULTRAM) 50 MG tablet Take 1 tablet (50 mg total) by mouth every 6 (six) hours as needed. (Patient not taking: Reported on 05/04/2019) 15 tablet 0 Completed Course at Unknown time   Assessment: 78yo male on chronic Warfarin PTA.  INR 2.1 today when admitted.  Pt has had some vomiting and is now acutely ill.  Home dose listed as 3mg  on Sun, Mon, and Fri and 2.5mg  all other days.   CKD4- per nephrology note- Due to multiple comorbidities, patient would not be a candidate for hemodialysis.  Home Regimen: Warfarin 3mg  Sun, Mon, Fri                              Warfarin  2.5mg  Tues, Weds, Thurs, Sat  DDIs: ceftriaxone, Lokelma  DATE  INR  DOSE 8/7  2.1  2.5 mg 8/8  2.9  2 mg  8/9  4.3  Hold  8/10  5.7  HOLD  Goal of Therapy:  INR 2-3 Monitor platelets by anticoagulation protocol: Yes   Plan:   INR elevated: hold dose today  F/U INR in am  Dallie Piles, PharmD 05/07/2019,8:29 AM

## 2019-05-07 NOTE — Evaluation (Signed)
Clinical/Bedside Swallow Evaluation Patient Details  Name: Michael Bush MRN: [id number] Date of Birth: 10/30/1940  Today's Date: 05/07/2019 Time: SLP Start Time (ACUTE ONLY): 0940 SLP Stop Time (ACUTE ONLY): 1040 SLP Time Calculation (min) (ACUTE ONLY): 60 min  Past Medical History:  Past Medical History:  Diagnosis Date  . Arthritis   . CHF (congestive heart failure) ([place])   . Chronic kidney disease    chronic kidney disease  . Colon cancer ([place]) 2003   Hx Partial colon resection, chemo + rad tx's.  Marland Kitchen COPD (chronic obstructive pulmonary disease) ([place])   . Coronary artery disease   . Diabetes mellitus without complication ([place])   . Dysrhythmia    atrial fib., bradycardia  . GERD (gastroesophageal reflux disease)   . Hypertension   . Hypothyroidism   . Long term (current) use of anticoagulants   . Mixed hearing loss, unilateral    03/07/14  . Myocardial infarction ([place])   . Presence of permanent cardiac pacemaker    [employer] Accolade DDDR [phone]  . Pseudophakia of both eyes   . Sick sinus syndrome ([place]) 04/26/2014  . Stroke [facility])    Past Surgical History:  Past Surgical History:  Procedure Laterality Date  . Cardioversion External    . Cataract cortical ssenile    . COLON SURGERY    . COLON SURGERY    . COLONOSCOPY WITH PROPOFOL N/A 01/27/2018   Procedure: COLONOSCOPY WITH PROPOFOL;  Surgeon: Lollie Sails, MD;  Location: [facility] ENDOSCOPY;  Service: Endoscopy;  Laterality: N/A;  . CORONARY ANGIOPLASTY    . Insertion dual chamber pacemaker generator    . IR CATHETER TUBE CHANGE  03/16/2019  . 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 He complained of nausea and inability to keep oral intake down. Nephrology is following d/t kidney function decline; Patient states that he would not want to be on dialysis and patient's wife confirms this as well. Hospice is following as well w/ plan to d/c w/ Hospice.    Assessment / Plan / Recommendation Clinical Impression  Pt appears to present w/ mild+ oropharyngeal phase dysphagia w/ mild-mod increased risk for aspiration/choking w/ both foods and liquids. When following general aspiration precautions and breaking down solid foods moistening them well, pt's risk for aspiration/choking is reduced. Pt exhibited oral motor decline in lingual strength and coordinating movements; noted min R deviation and increased involuntary movements. This decreased oral-lingual control can decrease bolus control during oropharyngeal phases of swallowing. Pt is also Edentulous and unable to effectively masticate solids. Both pt and Wife have noted pt's foods become "stuck under the dentures sometimes" - this may be leading to his choking episodes he experiences at home per report.  Pt was positioned fully upright in bed w/ pillow lower behind back. Pt consumed trials of thin liquids via straw w/ support, then purees, and softened, broken down solids. No immediate, overt coughing or throat clearing noted; audible swallows were appreciated. In light of the slower oral phase movements and the audible swalloing, pt was instructed to take 1-2 single sips at  a time w/ a rest break to reorganize b/t boluses. No decline in respiratory status noted w/ trials. Pt exhibited adequate bolus management during the oral phase of the broken down solids; given time, he cleared appropriately. Per Wife's description of meal preparation at home, recommend a Dysphagia level 2 moistened well w/ thin liquids;  strict aspiration precautions; feeding support at meals; Pills in Puree from now on. This was discussed w/ Wife/pt who agreed.  SLP Visit Diagnosis: Dysphagia, oropharyngeal phase (R13.12)    Aspiration Risk  Mild aspiration risk;Moderate aspiration risk;Risk for inadequate nutrition/hydration    Diet Recommendation  Dysphagia level 2 (minced foods) w/ gravies added; Thin liquids. Strict aspiration precautions; feeding support at meals  Medication Administration: Whole meds with puree(vs Crushed in puree )    Other  Recommendations Recommended Consults: (Dietician f/u) Oral Care Recommendations: Oral care BID;Staff/trained caregiver to provide oral care Other Recommendations: (n/a)   Follow up Recommendations None(TBD)      Frequency and Duration min 2x/week  1 week       Prognosis Prognosis for Safe Diet Advancement: Fair Barriers to Reach Goals: Time post onset;Severity of deficits      Swallow Study   General Date of Onset: 05/04/19 HPI: Pt is a 78 year old male with past medical history of atrial fibrillation on Coumadin, 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 He complained of nausea and inability to keep oral intake down. Nephrology is following d/t kidney function decline; Patient states that he would not want to be on dialysis and patient's wife confirms this as well. Hospice is following as well w/ plan to d/c w/ Hospice.  Type of Study: Bedside Swallow Evaluation Previous Swallow Assessment: it was suggested pt may have been seen for dysphagia prior post CVA.  Diet Prior to this Study: Regular;Thin liquids Temperature Spikes Noted: No(wbc  7.4) Respiratory Status: Room air History of Recent Intubation: No Behavior/Cognition: Alert;Cooperative;Pleasant mood(Dysphonia; Dysarthria) Oral Cavity Assessment: Dry(min) Oral Care Completed by SLP: Recent completion by staff Oral Cavity - Dentition: Edentulous(not wearing his Dentures - loose fitting) Vision: Functional for self-feeding Self-Feeding Abilities: Able to feed self;Needs assist;Needs set up;Total assist(weakness) Patient Positioning: Upright in bed(needed positioning) Baseline Vocal Quality: Low vocal intensity Volitional Cough: Weak;Congested(at times) Volitional Swallow: Able to elicit    Oral/Motor/Sensory Function Overall Oral Motor/Sensory Function: Mild impairment(-Moderate impairment) Facial Symmetry: Within Functional Limits(grossly) Lingual ROM: Within Functional Limits Lingual Symmetry: Abnormal symmetry right(min deviation R) Lingual Strength: Reduced(increased movements) Velum: Within Functional Limits Mandible: Within Functional Limits   Ice Chips Ice chips: Within functional limits Presentation: Spoon(fed; 2 trials) Other Comments: slower oral motor movements   Thin Liquid Thin Liquid: Impaired(min+) Presentation: Straw;Self Fed(assisted; 6 trials) Oral Phase Impairments: Reduced lingual movement/coordination(min) Oral Phase Functional Implications: Prolonged oral transit(min) Pharyngeal  Phase Impairments: (audible swallowing)    Nectar Thick Nectar Thick Liquid: Not tested   Honey Thick Honey Thick Liquid: Not tested   Puree Puree: Within functional limits Presentation: Self Fed;Spoon(assisted; 6 trials) Other Comments: grossly WFL   Solid     Solid: Impaired(broken down and moistened well) Presentation: Spoon;Self Fed(assisted; 4 trials) Oral Phase Impairments: Reduced lingual movement/coordination;Impaired mastication(edentulous; slow) Oral Phase Functional Implications: Impaired mastication;Prolonged oral transit(min) Pharyngeal Phase  Impairments: (none)        Orinda Kenner, MS, CCC-SLP [facility] 05/07/2019,1:58 PM

## 2019-05-08 LAB — BASIC METABOLIC PANEL
Anion gap: 10 (ref 5–15)
BUN: 72 mg/dL — ABNORMAL HIGH (ref 8–23)
CO2: 19 mmol/L — ABNORMAL LOW (ref 22–32)
Calcium: 8.2 mg/dL — ABNORMAL LOW (ref 8.9–10.3)
Chloride: 112 mmol/L — ABNORMAL HIGH (ref 98–111)
Creatinine, Ser: 3.73 mg/dL — ABNORMAL HIGH (ref 0.61–1.24)
GFR calc Af Amer: 17 mL/min — ABNORMAL LOW (ref >60)
GFR calc non Af Amer: 15 mL/min — ABNORMAL LOW (ref >60)
Glucose, Bld: 124 mg/dL — ABNORMAL HIGH (ref 70–99)
Potassium: 4.3 mmol/L (ref 3.5–5.1)
Sodium: 141 mmol/L (ref 135–145)

## 2019-05-08 LAB — GLUCOSE, CAPILLARY
Glucose-Capillary: 105 mg/dL — ABNORMAL HIGH (ref 70–99)
Glucose-Capillary: 114 mg/dL — ABNORMAL HIGH (ref 70–99)

## 2019-05-08 LAB — PROTIME-INR
INR: 6 (ref 0.8–1.2)
Prothrombin Time: 52.7 seconds — ABNORMAL HIGH (ref 11.4–15.2)
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
## 2019-05-08 NOTE — TOC Transition Note (Signed)
Transition of Care [facility]) - CM/SW Discharge Note   Patient Details  Name: Michael Bush MRN: [id number] Date of Birth: Sep 06, 1941  Transition of Care [facility] CM/SW Contact:  Katrina Stack, RN Phone Number: 05/08/2019, 9:36 AM   Clinical Narrative:   Patient to discharge home via EMS with resumption of home hospice from [facility] Left voicemail for wife    Final next level of care: Home w Hospice Care Barriers to Discharge: No Barriers Identified   Patient Goals and CMS Choice Patient states their goals for this hospitalization and ongoing recovery are:: Go home and resume home hospice services      Discharge Placement                       Discharge Plan and Services                                     Social Determinants of Health (SDOH) Interventions     Readmission Risk Interventions Readmission Risk Prevention Plan 05/06/2019  Transportation Screening Complete  HRI or Home Care Consult Complete  Palliative Care Screening Complete  Medication Review (RN Care Manager) Complete  Some recent data might be hidden

## 2019-05-08 NOTE — Progress Notes (Signed)
Visit made. Patient seen lying in bed, able to converse, voice remains weak. Wife [name] in during visit, hospital bed is in place in the home and family is ready for patient to discharge. Patient will need EMS, hospital care team aware. Hospice tem alerted to planned discharge. Discharge summary to be faxed to triage when posted.  Flo Shanks BSN, RN, [facility] [phone]

## 2019-05-08 NOTE — Progress Notes (Signed)
Patient discharging home with hospice; wife at bedside.  IV removed without complication; patient tolerated well.  Patient discharged home via EMS in stable condition.

## 2019-05-08 NOTE — Discharge Instructions (Signed)
Hospice care at home. °

## 2019-05-08 NOTE — Progress Notes (Signed)
[facility], [place] 05/08/19  Subjective:  Late entry. Patient seen prior to discharge. He was found to be breathing comfortably this a.m. Patient actually had good urine output yesterday 2.1 L.    Objective:  Vital signs in last 24 hours:  Temp:  [98.4 F (36.9 C)-100 F (37.8 C)] 100 F (37.8 C) (08/11 0418) Pulse Rate:  [62-63] 63 (08/11 0418) Resp:  [17-20] 20 (08/11 0418) BP: (144-155)/(64-66) 155/64 (08/11 0418) SpO2:  [92 %] 92 % (08/11 0418)  Weight change:  Filed Weights   05/04/19 1721 05/04/19 2229  Weight: 79.8 kg 80.3 kg    Intake/Output:    Intake/Output Summary (Last 24 hours) at 05/08/2019 1356 Last data filed at 05/08/2019 0931 Gross per 24 hour  Intake 1028.89 ml  Output 1775 ml  Net -746.11 ml   Physical Exam: General: Chronically ill-appearing gentleman, laying in the bed  HEENT Moist oral mucous membranes  Neck: Supple  Lungs: Basilar rales, normal effort  Heart:: Irregular  Abdomen: Soft, nontender, suprapubic catheter in place  Extremities: Trace edema  Neurologic: Slurred speech due to stroke, awake, alert  Skin: No rashes    Basic Metabolic Panel:  Recent Labs  Lab 05/05/19 0102 05/05/19 1055 05/06/19 0615 05/07/19 0538 05/08/19 0643  NA 139 139 143 142 141  K 4.9 5.3* 5.5* 5.5* 4.3  CL 108 106 111 110 112*  CO2 21* 21* 20* 19* 19*  GLUCOSE 288* 262* 163* 136* 124*  BUN 39* 50* 68* 85* 72*  CREATININE 3.81* 4.81* 6.10* 7.74* 3.73*  CALCIUM 8.1* 8.4* 8.1* 8.2* 8.2*     CBC: Recent Labs  Lab 05/04/19 1725 05/05/19 0102 05/07/19 0538  WBC 18.4* 14.0* 7.4  NEUTROABS 16.8*  --   --   HGB 14.8 12.5* 12.2*  HCT 45.4 38.6* 38.4*  MCV 88.2 88.5 88.3  PLT 214 163 144*     No results found for: HEPBSAG, HEPBSAB, HEPBIGM    Microbiology:  Recent Results (from the past 240 hour(s))  Urine culture     Status: Abnormal   Collection Time: 05/04/19  5:25 PM   Specimen: Urine, Random   Result Value Ref Range Status   Specimen Description   Final    URINE, RANDOM Performed at [facility], [address] [place], [place] [address]    Special Requests   Final    NONE Performed at [facility], [address] [place], [place] [address]    Culture MULTIPLE SPECIES PRESENT, SUGGEST RECOLLECTION (A)  Final   Report Status 05/06/2019 FINAL  Final  SARS Coronavirus 2 [facility] order, Performed in [facility] hospital lab) Nasopharyngeal Nasopharyngeal Swab     Status: None   Collection Time: 05/04/19  6:50 PM   Specimen: Nasopharyngeal Swab  Result Value Ref Range Status   SARS Coronavirus 2 NEGATIVE NEGATIVE Final    Comment: (NOTE) If result is NEGATIVE SARS-CoV-2 target nucleic acids are NOT DETECTED. The SARS-CoV-2 RNA is generally detectable in upper and lower  respiratory specimens during the acute phase of infection. The lowest  concentration of SARS-CoV-2 viral copies this assay can detect is 250  copies / mL. A negative result does not preclude SARS-CoV-2 infection  and should not be used as the sole basis for treatment or other  patient management decisions.  A negative result may occur with  improper specimen collection / handling, submission of specimen other  than nasopharyngeal swab, presence of viral mutation(s) within the  areas  targeted by this assay, and inadequate number of viral copies  (<250 copies / mL). A negative result must be combined with clinical  observations, patient history, and epidemiological information. If result is POSITIVE SARS-CoV-2 target nucleic acids are DETECTED. The SARS-CoV-2 RNA is generally detectable in upper and lower  respiratory specimens dur ing the acute phase of infection.  Positive  results are indicative of active infection with SARS-CoV-2.  Clinical  correlation with patient history and other diagnostic information is  necessary to determine patient infection status.  Positive results do   not rule out bacterial infection or co-infection with other viruses. If result is PRESUMPTIVE POSTIVE SARS-CoV-2 nucleic acids MAY BE PRESENT.   A presumptive positive result was obtained on the submitted specimen  and confirmed on repeat testing.  While 2019 novel coronavirus  (SARS-CoV-2) nucleic acids may be present in the submitted sample  additional confirmatory testing may be necessary for epidemiological  and / or clinical management purposes  to differentiate between  SARS-CoV-2 and other Sarbecovirus currently known to infect humans.  If clinically indicated additional testing with an alternate test  methodology [phone]) is advised. The SARS-CoV-2 RNA is generally  detectable in upper and lower respiratory sp ecimens during the acute  phase of infection. The expected result is Negative. Fact Sheet for Patients:  StrictlyIdeas.no Fact Sheet for Healthcare Providers: BankingDealers.co.za This test is not yet approved or cleared by the [place] FDA and has been authorized for detection and/or diagnosis of SARS-CoV-2 by FDA under an Emergency Use Authorization (EUA).  This EUA will remain in effect (meaning this test can be used) for the duration of the COVID-19 declaration under Section 564(b)(1) of the Act, 21 U.S.C. section 360bbb-3(b)(1), unless the authorization is terminated or revoked sooner. Performed at [facility], [address] [place], [place] [address]     Coagulation Studies: Recent Labs    05/06/19 0615 05/07/19 0538 05/08/19 0643  LABPROT 40.7* 50.3* 52.7*  INR 4.3* 5.7* 6.0*    Urinalysis: No results for input(s): COLORURINE, LABSPEC, PHURINE, GLUCOSEU, HGBUR, BILIRUBINUR, KETONESUR, PROTEINUR, UROBILINOGEN, NITRITE, LEUKOCYTESUR in the last 72 hours.  Invalid input(s): APPERANCEUR    Imaging: No results found.   Medications:   . sodium chloride 50 mL/hr at 05/08/19 0931  .  cefTRIAXone (ROCEPHIN)  IV Stopped (05/07/19 1849)   . atorvastatin  40 mg Oral q1800  . famotidine  20 mg Oral Daily  . feeding supplement (NEPRO CARB STEADY)  237 mL Oral BID BM  . insulin aspart  0-5 Units Subcutaneous QHS  . insulin aspart  0-9 Units Subcutaneous TID WC  . sertraline  25 mg Oral Daily  . sodium zirconium cyclosilicate  10 g Oral Daily  . tamsulosin  0.4 mg Oral Daily  . traZODone  50 mg Oral QHS  . vitamin B-12  1,000 mcg Oral Daily  . Warfarin - Pharmacist Dosing Inpatient   Does not apply q1800   acetaminophen, alum & mag hydroxide-simeth, nitroGLYCERIN, oxyCODONE  Assessment/ Plan:  78 y.o. Caucasian male with Multiple chronic conditions including congestive heart failure, COPD, coronary disease, diabetes, atrial fibrillation, hypothyroidism, hearing loss, history of cardiac pacemaker placement, stroke, requiring suprapubic catheter, recent multiple UTIs, was admitted on 05/04/2019 with Fever, altered mental status.   1.  Acute kidney injury. Likely ATN. Multifactorial with contribution from concurrent UTI, IV contrast exposure on June 21 The patient's renal function actually improved significantly.  Creatinine was down to 3.7 with an EGFR of 15.  Urine output was also significantly improved at 2.1 L.  Therefore no indication for dialysis.  Family has decided upon comfort care in any case.  Potassium also has normalized at 4.3..  2.  Hyperkalemia.  Potassium yesterday was 5.5 and now down to 4.3.  Recommend discontinuation of Lokelma.  3.  Metabolic acidosis.  Most recent serum bicarbonate was 19.  Would not recommend sodium bicarbonate repletion at this time.      LOS: 4 Rheagan Nayak 8/11/20201:56 PM  [place], [place]  Note: This note was prepared with Dragon dictation. Any transcription errors are unintentional

## 2019-05-08 NOTE — Discharge Summary (Signed)
[place] [facility] [place] NAME: Michael Bush    MR#:  [id number]  DATE OF BIRTH:  1941-07-17  DATE OF ADMISSION:  05/04/2019   ADMITTING PHYSICIAN: Lang Snow, NP  DATE OF DISCHARGE: 05/08/2019  PRIMARY CARE PHYSICIAN: Clarisse Gouge, MD   ADMISSION DIAGNOSIS:  Fever [R50.9] AKI (acute kidney injury) ([place]) [N17.9] Urinary tract infection without hematuria, site unspecified [N39.0] DISCHARGE DIAGNOSIS:  Active Problems:   Sepsis due to urinary tract infection ([place])  SECONDARY DIAGNOSIS:   Past Medical History:  Diagnosis Date   Arthritis    CHF (congestive heart failure) ([place])    Chronic kidney disease    chronic kidney disease   Colon cancer ([place]) 2003   Hx Partial colon resection, chemo + rad tx's.   COPD (chronic obstructive pulmonary disease) ([facility])    Coronary artery disease    Diabetes mellitus without complication ([facility])    Dysrhythmia    atrial fib., bradycardia   GERD (gastroesophageal reflux disease)    Hypertension    Hypothyroidism    Long term (current) use of anticoagulants    Mixed hearing loss, unilateral    03/07/14   Myocardial infarction [facility])    Presence of permanent cardiac pacemaker    [employer] DDDR [phone]   Pseudophakia of both eyes    Sick sinus syndrome ([place]) 04/26/2014   Stroke [facility])    HOSPITAL COURSE:  78 y.o.malewith past medical history of atrial fibrillation on Coumadin, 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  1.Sepsis-likely secondary to UTI. Patient presenting with fever decreased urine output, confusion with  elevated lactic acid 5.5 and leukocytosis meeting sepsis criteria. Treated with IV fluid bolus per sepsis protocol -Chest x-ray reviewed shows no acute abnormality -Blood cultures pending -UA positive for UTI -Urine cultures: MULTIPLE SPECIES PRESENT, SUGGEST RECOLLECTIONAbnormal  He is treated with ceftriaxone.   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 -Likely prerenal, worsening -Holding nephrotoxins Renal function has been worsening despite of IV fluids Renal ultrasound did not report obstruction.  The patient's wife does not want hemodialysis for the patient.  She prefers hospice care. Patient has urine output today. Renal function is better.   Hyperkalemia. Due to ARF on CKD.  He is treated withLokelma. Improved.  Lactic acidosis. Continue above treatment.  Improved.  Acute metabolic encephalopathy due to above.  Aspiration precaution.  3.Atrial fibrillation -hold Coumadin due to supratherapeutic INR.  4.Diabetes mellitus type 2 -Hold glipizide in the setting of AKI He is on sliding scale  5.Hyperlipidemia Hold Atorvastatin.  6.History of CVA with residual left-sided hemiplegia -On Coumadin for thrombosis -PT/INR supratherapeutic. Hold coumdin.  7.Hypertension- Stable  8.Urinary retention-managed by SPT withplannedexchangeon 06/01/2019 Per Dr. Bernardo Heater, Suprapubic tube was exchanged on 05/01/2019 and does not need further management; Patient under hospice care and is a DNR. Depending on family desires  if creatinine continues to rise could consider a noncontrast CT of the abdomen pelvis to evaluate for worsening hydronephrosis.  His wife understand the patient's condition is worsening.  The patient was in hospice care at home.  She prefers hospice care at home on discharge. Discussed with Dr. Holley Raring. DISCHARGE CONDITIONS:  Poor prognosis, discharge  to home with hospice care. CONSULTS OBTAINED:  Treatment Team:  Abbie Sons,  MD DRUG ALLERGIES:   Allergies  Allergen Reactions   Vancomycin Swelling    Facial swelling   Dabigatran Other (See Comments)    Bleeding complications Bleeding complications    Pollen Extract    Pradaxa [Dabigatran Etexilate Mesylate]    DISCHARGE MEDICATIONS:   Allergies as of 05/08/2019      Reactions   Vancomycin Swelling   Facial swelling   Dabigatran Other (See Comments)   Bleeding complications Bleeding complications   Pollen Extract    Pradaxa [dabigatran Etexilate Mesylate]       Medication List    STOP taking these medications   atorvastatin 40 MG tablet Commonly known as: LIPITOR   Calcium Carb-Ergocalciferol 500-200 MG-UNIT Tabs   ciprofloxacin 250 MG tablet Commonly known as: Cipro   citalopram 20 MG tablet Commonly known as: CELEXA   doxycycline 100 MG capsule Commonly known as: VIBRAMYCIN   glipiZIDE 5 MG tablet Commonly known as: GLUCOTROL   KP Fish Oil 1200 MG Caps   lactulose 20 g packet Commonly known as: CEPHULAC   sulfamethoxazole-trimethoprim 800-160 MG tablet Commonly known as: BACTRIM DS   traMADol 50 MG tablet Commonly known as: ULTRAM   VITAMIN B COMPLEX PO   vitamin B-12 1000 MCG tablet Commonly known as: CYANOCOBALAMIN   warfarin 3 MG tablet Commonly known as: COUMADIN     TAKE these medications   acetaminophen 325 MG tablet Commonly known as: TYLENOL Take 650 mg by mouth every 6 (six) hours as needed for mild pain or moderate pain.   albuterol 108 (90 Base) MCG/ACT inhaler Commonly known as: VENTOLIN HFA Inhale 2 puffs into the lungs every 6 (six) hours as needed.   famotidine 20 MG tablet Commonly known as: PEPCID Take 20 mg by mouth 2 (two) times daily.   ketotifen 0.025 % ophthalmic solution Commonly known as: ZADITOR INT 1 GTT IN OU BID FOR ALLERGIES OR ITCHING   nitroGLYCERIN 0.4 MG SL tablet Commonly known as: NITROSTAT Place 1 tablet (0.4 mg total) under the tongue every 5 (five) minutes as  needed for chest pain.   oxyCODONE 5 MG immediate release tablet Commonly known as: Oxy IR/ROXICODONE Take 5 mg by mouth at bedtime as needed for severe pain.   sertraline 25 MG tablet Commonly known as: ZOLOFT Take 25 mg by mouth daily.   tamsulosin 0.4 MG Caps capsule Commonly known as: FLOMAX Take 1 capsule (0.4 mg total) by mouth daily.   tiZANidine 2 MG tablet Commonly known as: ZANAFLEX Take 2 mg by mouth at bedtime.   traZODone 50 MG tablet Commonly known as: DESYREL Take 50 mg by mouth at bedtime.        DISCHARGE INSTRUCTIONS:  See AVS. If you experience worsening of your admission symptoms, develop shortness of breath, life threatening emergency, suicidal or homicidal thoughts you must seek medical attention immediately by calling 911 or calling your MD immediately  if symptoms less severe.  You Must read complete instructions/literature along with all the possible adverse reactions/side effects for all the Medicines you take and that have been prescribed to you. Take any new Medicines after you have completely understood and accpet all the possible adverse reactions/side effects.   Please note  You were cared for by a hospitalist during your hospital stay. If you have any questions about your discharge medications or the care you received while you were in the hospital after you  are discharged, you can call the unit and asked to speak with the hospitalist on call if the hospitalist that took care of you is not available. Once you are discharged, your primary care physician will handle any further medical issues. Please note that NO REFILLS for any discharge medications will be authorized once you are discharged, as it is imperative that you return to your primary care physician (or establish a relationship with a primary care physician if you do not have one) for your aftercare needs so that they can reassess your need for medications and monitor your lab  values.    On the day of Discharge:  VITAL SIGNS:  Blood pressure (!) 155/64, pulse 63, temperature 100 F (37.8 C), temperature source Oral, resp. rate 20, height 5\' 9"  (1.753 m), weight 80.3 kg, SpO2 92 %. PHYSICAL EXAMINATION:  GENERAL:  78 y.o.-year-old patient lying in the bed with no acute distress.  EYES: Pupils equal, round, reactive to light and accommodation. No scleral icterus. Extraocular muscles intact.  HEENT: Head atraumatic, normocephalic.Marland Kitchen  NECK:  Supple, no jugular venous distention. No thyroid enlargement, no tenderness.  LUNGS: Normal breath sounds bilaterally, no wheezing, rales,rhonchi or crepitation. No use of accessory muscles of respiration.  CARDIOVASCULAR: S1, S2 normal. No murmurs, rubs, or gallops.  ABDOMEN: Soft, non-tender, non-distended. Bowel sounds present. No organomegaly or mass.  EXTREMITIES: No pedal edema, cyanosis, or clubbing.  NEUROLOGIC: unable to exam. PSYCHIATRIC: The patient is confused  SKIN: No obvious rash, lesion, or ulcer.  DATA REVIEW:   CBC Recent Labs  Lab 05/07/19 0538  WBC 7.4  HGB 12.2*  HCT 38.4*  PLT 144*    Chemistries  Recent Labs  Lab 05/04/19 1725  05/08/19 0643  NA 140   < > 141  K 4.6   < > 4.3  CL 102   < > 112*  CO2 23   < > 19*  GLUCOSE 334*   < > 124*  BUN 35*   < > 72*  CREATININE 3.30*   < > 3.73*  CALCIUM 9.2   < > 8.2*  AST 23  --   --   ALT 18  --   --   ALKPHOS 74  --   --   BILITOT 1.2  --   --    < > = values in this interval not displayed.     Microbiology Results  Results for orders placed or performed during the hospital encounter of 05/04/19  Urine culture     Status: Abnormal   Collection Time: 05/04/19  5:25 PM   Specimen: Urine, Random  Result Value Ref Range Status   Specimen Description   Final    URINE, RANDOM Performed at [facility], [address] [place], [place] [address]    Special Requests   Final    NONE Performed at [facility], [address] [place], [place] [address]    Culture MULTIPLE SPECIES PRESENT, SUGGEST RECOLLECTION (A)  Final   Report Status 05/06/2019 FINAL  Final  SARS Coronavirus 2 [facility] order, Performed in [facility] hospital lab) Nasopharyngeal Nasopharyngeal Swab     Status: None   Collection Time: 05/04/19  6:50 PM   Specimen: Nasopharyngeal Swab  Result Value Ref Range Status   SARS Coronavirus 2 NEGATIVE NEGATIVE Final    Comment: (NOTE) If result is NEGATIVE SARS-CoV-2 target nucleic acids are NOT DETECTED. The SARS-CoV-2 RNA is generally detectable in upper and lower  respiratory  specimens during the acute phase of infection. The lowest  concentration of SARS-CoV-2 viral copies this assay can detect is 250  copies / mL. A negative result does not preclude SARS-CoV-2 infection  and should not be used as the sole basis for treatment or other  patient management decisions.  A negative result may occur with  improper specimen collection / handling, submission of specimen other  than nasopharyngeal swab, presence of viral mutation(s) within the  areas targeted by this assay, and inadequate number of viral copies  (<250 copies / mL). A negative result must be combined with clinical  observations, patient history, and epidemiological information. If result is POSITIVE SARS-CoV-2 target nucleic acids are DETECTED. The SARS-CoV-2 RNA is generally detectable in upper and lower  respiratory specimens dur ing the acute phase of infection.  Positive  results are indicative of active infection with SARS-CoV-2.  Clinical  correlation with patient history and other diagnostic information is  necessary to determine patient infection status.  Positive results do  not rule out bacterial infection or co-infection with other viruses. If result is PRESUMPTIVE POSTIVE SARS-CoV-2 nucleic acids MAY BE PRESENT.   A presumptive positive result was obtained on the submitted specimen  and confirmed on repeat  testing.  While 2019 novel coronavirus  (SARS-CoV-2) nucleic acids may be present in the submitted sample  additional confirmatory testing may be necessary for epidemiological  and / or clinical management purposes  to differentiate between  SARS-CoV-2 and other Sarbecovirus currently known to infect humans.  If clinically indicated additional testing with an alternate test  methodology [phone]) is advised. The SARS-CoV-2 RNA is generally  detectable in upper and lower respiratory sp ecimens during the acute  phase of infection. The expected result is Negative. Fact Sheet for Patients:  StrictlyIdeas.no Fact Sheet for Healthcare Providers: BankingDealers.co.za This test is not yet approved or cleared by the [place] FDA and has been authorized for detection and/or diagnosis of SARS-CoV-2 by FDA under an Emergency Use Authorization (EUA).  This EUA will remain in effect (meaning this test can be used) for the duration of the COVID-19 declaration under Section 564(b)(1) of the Act, 21 U.S.C. section 360bbb-3(b)(1), unless the authorization is terminated or revoked sooner. Performed at [facility], [address] [place], [place] [address]     RADIOLOGY:  No results found.   Management plans discussed with the patient, family and they are in agreement.  CODE STATUS: DNR   TOTAL TIME TAKING CARE OF THIS PATIENT: 33 minutes.    Demetrios Loll M.D on 05/08/2019 at 11:39 AM  Between 7am to 6pm - Pager - [phone]  After 6pm go to [email or web address] - Technical brewer [facility]  Office  [phone]  CC: Primary care physician; Clarisse Gouge, MD   Note: This dictation was prepared with Dragon dictation along with smaller phrase technology. Any transcriptional errors that result from this process are unintentional.

## 2019-05-08 NOTE — Progress Notes (Signed)
ANTICOAGULATION CONSULT NOTE -   Pharmacy Consult for Warfarin Indication: atrial fibrillation (home medication)  Patient Measurements: Height: 5\' 9"  (175.3 cm) Weight: 177 lb 0.5 oz (80.3 kg) IBW/kg (Calculated) : 70.7  Vital Signs: Temp: 100 F (37.8 C) (08/11 0418) Temp Source: Oral (08/11 0418) BP: 155/64 (08/11 0418) Pulse Rate: 63 (08/11 0418)  Labs: Recent Labs    05/06/19 0615 05/07/19 0538 05/08/19 0643  HGB  --  12.2*  --   HCT  --  38.4*  --   PLT  --  144*  --   LABPROT 40.7* 50.3* 52.7*  INR 4.3* 5.7* 6.0*  CREATININE 6.10* 7.74* 3.73*   Estimated Creatinine Clearance: 16.1 mL/min (A) (by C-G formula based on SCr of 3.73 mg/dL (H)).  Medical History: Past Medical History:  Diagnosis Date  . Arthritis   . CHF (congestive heart failure) ([place])   . Chronic kidney disease    chronic kidney disease  . Colon cancer ([place]) 2003   Hx Partial colon resection, chemo + rad tx's.  Marland Kitchen COPD (chronic obstructive pulmonary disease) ([place])   . Coronary artery disease   . Diabetes mellitus without complication ([place])   . Dysrhythmia    atrial fib., bradycardia  . GERD (gastroesophageal reflux disease)   . Hypertension   . Hypothyroidism   . Long term (current) use of anticoagulants   . Mixed hearing loss, unilateral    03/07/14  . Myocardial infarction ([place])   . Presence of permanent cardiac pacemaker    [employer] DDDR [phone]  . Pseudophakia of both eyes   . Sick sinus syndrome ([place]) 04/26/2014  . Stroke [facility])    Medications:  Medications Prior to Admission  Medication Sig Dispense Refill Last Dose  . atorvastatin (LIPITOR) 40 MG tablet Take 40 mg by mouth daily at 6 PM.   3 Past Week at Unknown time  . B Complex Vitamins (VITAMIN B COMPLEX PO) Take 2 tablets by mouth daily.   Past Week at Unknown time  . Calcium Carb-Ergocalciferol 500-200 MG-UNIT TABS Take 1 tablet by mouth daily.    Past Week at Unknown time  . glipiZIDE (GLUCOTROL) 5 MG tablet  Take 5 mg by mouth 2 (two) times daily.   2 Past Week at Unknown time  . Omega-3 Fatty Acids (KP FISH OIL) 1200 MG CAPS Take by mouth.   Past Week at Unknown time  . oxyCODONE (OXY IR/ROXICODONE) 5 MG immediate release tablet Take 5 mg by mouth at bedtime as needed for severe pain.   Past Week at Unknown time  . sertraline (ZOLOFT) 25 MG tablet Take 25 mg by mouth daily.    Past Week at Unknown time  . tamsulosin (FLOMAX) 0.4 MG CAPS capsule Take 1 capsule (0.4 mg total) by mouth daily. 30 capsule 11 Past Week at Unknown time  . tiZANidine (ZANAFLEX) 2 MG tablet Take 2 mg by mouth at bedtime.    Past Week at Unknown time  . traZODone (DESYREL) 50 MG tablet Take 50 mg by mouth at bedtime.    Past Week at Unknown time  . vitamin B-12 (CYANOCOBALAMIN) 1000 MCG tablet Take 1,000 mcg by mouth daily.    Past Week at Unknown time  . warfarin (COUMADIN) 3 MG tablet Take 2.5-3 mg by mouth daily at 6 PM. Take 3 mg by mouth on Sunday, Monday and Friday and 2.5 mg on Tuesday, Wednesday, Thursday and Saturday   Past Week at Unknown time  . acetaminophen (TYLENOL)  325 MG tablet Take 650 mg by mouth every 6 (six) hours as needed for mild pain or moderate pain.    prn at prn  . albuterol (PROVENTIL HFA;VENTOLIN HFA) 108 (90 Base) MCG/ACT inhaler Inhale 2 puffs into the lungs every 6 (six) hours as needed. 1 Inhaler 0 prn at prn  . ciprofloxacin (CIPRO) 250 MG tablet Take 1 tablet (250 mg total) by mouth 2 (two) times daily. (Patient not taking: Reported on 05/04/2019) 14 tablet 0 Completed Course at Unknown time  . citalopram (CELEXA) 20 MG tablet Take 20 mg by mouth daily.     Marland Kitchen doxycycline (VIBRAMYCIN) 100 MG capsule Take 1 capsule (100 mg total) by mouth 2 (two) times daily. (Patient not taking: Reported on 05/04/2019) 20 capsule 0 Completed Course at Unknown time  . famotidine (PEPCID) 20 MG tablet Take 20 mg by mouth 2 (two) times daily.      Marland Kitchen ketotifen (ZADITOR) 0.025 % ophthalmic solution INT 1 GTT IN OU BID FOR  ALLERGIES OR ITCHING   Not Taking at Unknown time  . lactulose (CEPHULAC) 20 g packet Take 1 packet (20 g total) by mouth daily as needed (constipation). (Patient not taking: Reported on 05/04/2019) 5 each 0 Completed Course at Unknown time  . nitroGLYCERIN (NITROSTAT) 0.4 MG SL tablet Place 1 tablet (0.4 mg total) under the tongue every 5 (five) minutes as needed for chest pain. 30 tablet 0 prn at prn  . sulfamethoxazole-trimethoprim (BACTRIM DS) 800-160 MG tablet Take 1 tablet by mouth every 12 (twelve) hours. (Patient not taking: Reported on 05/04/2019) 14 tablet 0 Completed Course at Unknown time  . traMADol (ULTRAM) 50 MG tablet Take 1 tablet (50 mg total) by mouth every 6 (six) hours as needed. (Patient not taking: Reported on 05/04/2019) 15 tablet 0 Completed Course at Unknown time   Assessment: 78yo male on chronic Warfarin PTA.  INR 2.1 today when admitted.  Pt has had some vomiting and is now acutely ill.  Home dose listed as 3mg  on Sun, Mon, and Fri and 2.5mg  all other days.   CKD4- per nephrology note- Due to multiple comorbidities, patient would not be a candidate for hemodialysis.  Home Regimen: Warfarin 3mg  Sun, Mon, Fri                              Warfarin  2.5mg  Tivis Ringer, Thurs, Sat  DDIs: ceftriaxone, Lokelma  DATE  INR  DOSE 8/7  2.1  2.5 mg 8/8  2.9  2 mg  8/9  4.3  HOLD 8/10  5.7  HOLD 8/11  6.0  HOLD  Goal of Therapy:  INR 2-3 Monitor platelets by anticoagulation protocol: Yes   Plan:   INR continues to trend up; hold dose today  F/U INR in am  Benita Gutter, PharmD 05/08/2019,9:45 AM

## 2019-05-16 ENCOUNTER — Ambulatory Visit: Payer: Medicare Other | Admitting: Urology

## 2019-06-04 NOTE — Progress Notes (Deleted)
06/05/2019 9:12 PM   [name] 09/10/1941 [id number]  Referring provider: Clarisse Gouge, MD [address] [place],  [place] [address]  No chief complaint on file.   HPI: Mr. 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    On 03/18/2019 with the complaint of fevers and hematuria.  Contrast CT noted suprapubic catheter coiled within the urinary bladder which is decompressed. Mild inflammatory or infectious thickening along the catheter course. No evidence of abscess formation.  Stable chronic left hydronephrosis and hydroureter to the level of the distal 2/3 of the left ureter.  Distended gallbladder with a sliver of pericholecystic fluid.  Please correlate clinically to exclude acalculous cholecystitis.  Multiloculated area of hypoattenuation in the pancreatic body measuring 4.7 cm in greatest dimension, grossly stable from the CT dated 11/30/2017. 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  Evaluation with MRI of the abdomen, when clinically feasible may be considered.    Prior urological history:  PCNL in 2010 and ureteroscopic stone removal 2018.  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 Arthritis   . CHF (congestive heart failure) ([place])   . Chronic kidney disease    chronic kidney disease  . Colon cancer ([place]) 2003   Hx Partial colon resection, chemo + rad tx's.  Marland Kitchen COPD (chronic obstructive pulmonary disease) ([place])   . Coronary artery disease   . Diabetes mellitus without complication ([place])   . Dysrhythmia    atrial fib., bradycardia  . GERD (gastroesophageal reflux disease)   . Hypertension   . Hypothyroidism   . Long term (current) use of  anticoagulants   . Mixed hearing loss, unilateral    03/07/14  . Myocardial infarction ([place])   . Presence of permanent cardiac pacemaker    [employer] DDDR [phone]  . Pseudophakia of both eyes   . Sick sinus syndrome ([place]) 04/26/2014  . Stroke [facility])     Surgical History: Past Surgical History:  Procedure Laterality Date  . Cardioversion External    . Cataract cortical ssenile    . COLON SURGERY    . COLON SURGERY    . COLONOSCOPY WITH PROPOFOL N/A 01/27/2018   Procedure: COLONOSCOPY WITH PROPOFOL;  Surgeon: Lollie Sails, MD;  Location: [facility] ENDOSCOPY;  Service: Endoscopy;  Laterality: N/A;  . CORONARY ANGIOPLASTY    . Insertion dual chamber pacemaker generator    . IR CATHETER TUBE CHANGE  03/16/2019  . IR CATHETER TUBE CHANGE  04/13/2019    Home Medications:  Allergies as of 06/05/2019      Reactions   Vancomycin Swelling   Facial swelling   Dabigatran Other (See Comments)   Bleeding complications Bleeding complications   Pollen Extract    Pradaxa [dabigatran Etexilate Mesylate]       Medication List       Accurate as of June 04, 2019  9:12 PM. If you have any questions, ask your nurse or doctor.        acetaminophen 325 MG tablet Commonly known as: TYLENOL Take 650 mg by mouth every 6 (six) hours as needed for mild pain or moderate pain.   albuterol 108 (90 Base) MCG/ACT inhaler  Commonly known as: VENTOLIN HFA Inhale 2 puffs into the lungs every 6 (six) hours as needed.   famotidine 20 MG tablet Commonly known as: PEPCID Take 20 mg by mouth 2 (two) times daily.   ketotifen 0.025 % ophthalmic solution Commonly known as: ZADITOR INT 1 GTT IN OU BID FOR ALLERGIES OR ITCHING   nitroGLYCERIN 0.4 MG SL tablet Commonly known as: NITROSTAT Place 1 tablet (0.4 mg total) under the tongue every 5 (five) minutes as needed for chest pain.   oxyCODONE 5 MG immediate release tablet Commonly known as: Oxy IR/ROXICODONE Take 5 mg by mouth at bedtime  as needed for severe pain.   sertraline 25 MG tablet Commonly known as: ZOLOFT Take 25 mg by mouth daily.   tamsulosin 0.4 MG Caps capsule Commonly known as: FLOMAX Take 1 capsule (0.4 mg total) by mouth daily.   tiZANidine 2 MG tablet Commonly known as: ZANAFLEX Take 2 mg by mouth at bedtime.   traZODone 50 MG tablet Commonly known as: DESYREL Take 50 mg by mouth at bedtime.       Allergies:  Allergies  Allergen Reactions  . Vancomycin Swelling    Facial swelling  . Dabigatran Other (See Comments)    Bleeding complications Bleeding complications   . Pollen Extract   . Pradaxa [Dabigatran Etexilate Mesylate]     Family History: Family History  Problem Relation Age of Onset  . Cancer Father     Social History:  reports that he has quit smoking. His smoking use included cigarettes. He has a 60.00 pack-year smoking history. He has never used smokeless tobacco. He reports that he does not drink alcohol or use drugs.  ROS:                                        Physical Exam: There were no vitals taken for this visit.  Constitutional:  Well nourished. Alert and oriented, No acute distress. HEENT:  AT, moist mucus membranes.  Trachea midline, no masses. Cardiovascular: No clubbing, cyanosis, or edema. Respiratory: Normal respiratory effort, no increased work of breathing. GI: Abdomen is soft, non tender, non distended, no abdominal masses. Liver and spleen not palpable.  No hernias appreciated.  Stool sample for occult testing is not indicated.   GU: No CVA tenderness.  No bladder fullness or masses.  Patient with circumcised/uncircumcised phallus. ***Foreskin easily retracted***  Urethral meatus is patent.  No penile discharge. No penile lesions or rashes. Scrotum without lesions, cysts, rashes and/or edema.  Testicles are located scrotally bilaterally. No masses are appreciated in the testicles. Left and right epididymis are normal. Rectal:  Patient with  normal sphincter tone. Anus and perineum without scarring or rashes. No rectal masses are appreciated. Prostate is approximately *** grams, *** nodules are appreciated. Seminal vesicles are normal. Skin: No rashes, bruises or suspicious lesions. Lymph: No cervical or inguinal adenopathy. Neurologic: Grossly intact, no focal deficits, moving all 4 extremities. Psychiatric: Normal mood and affect.  Laboratory Data: Lab Results  Component Value Date   WBC 7.4 05/07/2019   HGB 12.2 (L) 05/07/2019   HCT 38.4 (L) 05/07/2019   MCV 88.3 05/07/2019   PLT 144 (L) 05/07/2019    Lab Results  Component Value Date   CREATININE 3.73 (H) 05/08/2019    No results found for: PSA  No results found for: TESTOSTERONE  Lab Results  Component Value Date  HGBA1C 7.6 (H) 05/04/2019    No results found for: TSH  No results found for: CHOL, HDL, CHOLHDL, VLDL, LDLCALC  Lab Results  Component Value Date   AST 23 05/04/2019   Lab Results  Component Value Date   ALT 18 05/04/2019   No components found for: ALKALINEPHOPHATASE No components found for: BILIRUBINTOTAL  No results found for: ESTRADIOL  I have reviewed the labs.  Suprapubic Cath Change Patient is present today for a suprapubic catheter change due to urinary retention.  5 ml of water was drained from the balloon, a 16 FR Council cath was removed from the tract with out difficulty.  Sutures were not intact.  Site was cleaned and prepped in a sterile fashion with betadine.  A 16 FR foley cath was replaced into the tract no complications were noted. Urine return was noted, 10 ml of sterile water was inflated into the balloon and a over night bag was attached for drainage.  Patient tolerated well. A night bag was given to patient and proper instruction was given on how to switch bags.  An area of granular tissue was addressed with silver nitrate.    Performed by: Zara Council, PA-C   Assessment & Plan:    1. Urinary  retention Managed by SPT Exchanged today RTC in one month for SPT exchange  2. History of hematuria Patient currently under hospice care - will monitor for gross hematuria and intervene only for palliative purposes   No follow-ups on file.  These notes generated with voice recognition software. I apologize for typographical errors.  Zara Council, PA-C  [facility] [address]  [place], [place] [address] [phone]

## 2019-06-05 ENCOUNTER — Ambulatory Visit: Payer: Medicare Other | Admitting: Urology

## 2019-06-06 ENCOUNTER — Encounter: Payer: Self-pay | Admitting: Urology

## 2019-06-07 ENCOUNTER — Telehealth: Payer: Self-pay | Admitting: Urology

## 2019-06-07 NOTE — Telephone Encounter (Signed)
I spoke with Michael Bush and stated that we do not need to exchange the SPT at this time.  She is to call back with any concerns.

## 2019-06-07 NOTE — Telephone Encounter (Signed)
Pt's wife called and states that he is now under Hospice care and "it's just a matter of time". She states that the hospital only gave him a few days, she wants to know if he needs his tube needs to be changed.

## 2019-06-28 DEATH — deceased

## 2019-10-10 IMAGING — XA IR CATHETER TUBE CHANGE
4 series · 4 of 4 positions shown · non-contrast
Comparison: None.

INDICATION: 77-year-old male with history of occluded suprapubic urinary
catheter

EXAM:
IMAGE GUIDED EXCHANGE OF SUPRAPUBIC CATHETER

[Series 1: vasc extremity · 1 of 1 slices shown (1 of 4)]
[im 1/1]
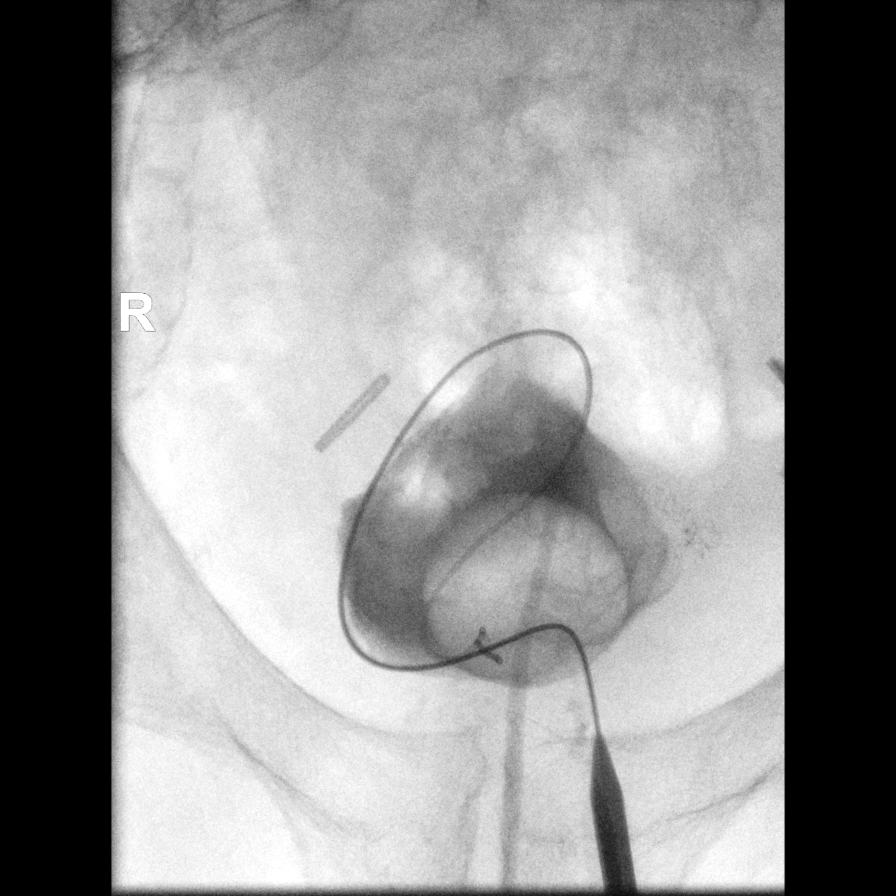

[Series 2: vasc extremity · 1 of 1 slices shown (2 of 4)]
[im 1/1]
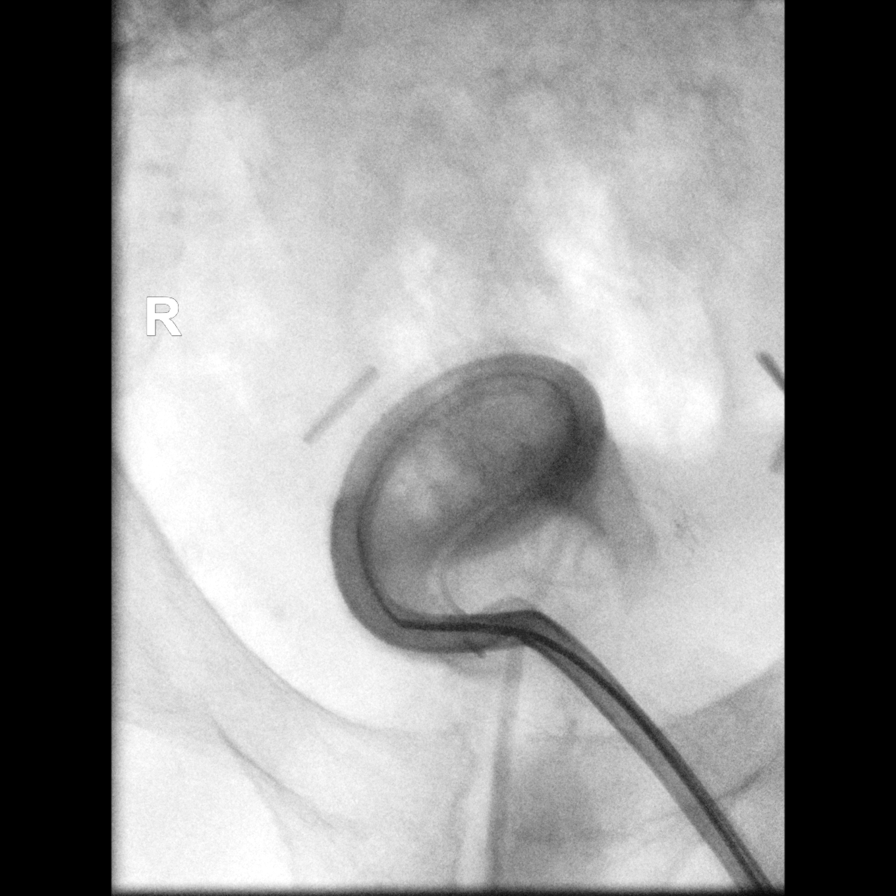

[Series 3: vasc extremity · 1 of 1 slices shown (3 of 4)]
[im 1/1]
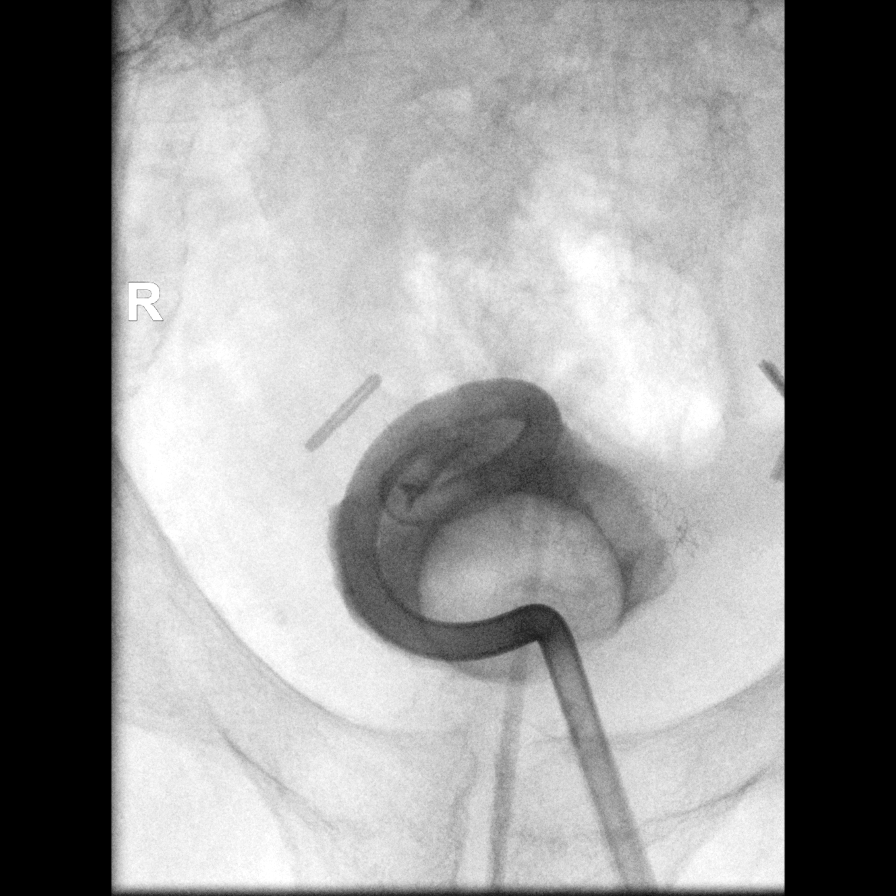

[Series 4: vasc extremity · 1 of 1 slices shown (4 of 4)]
[im 1/1]
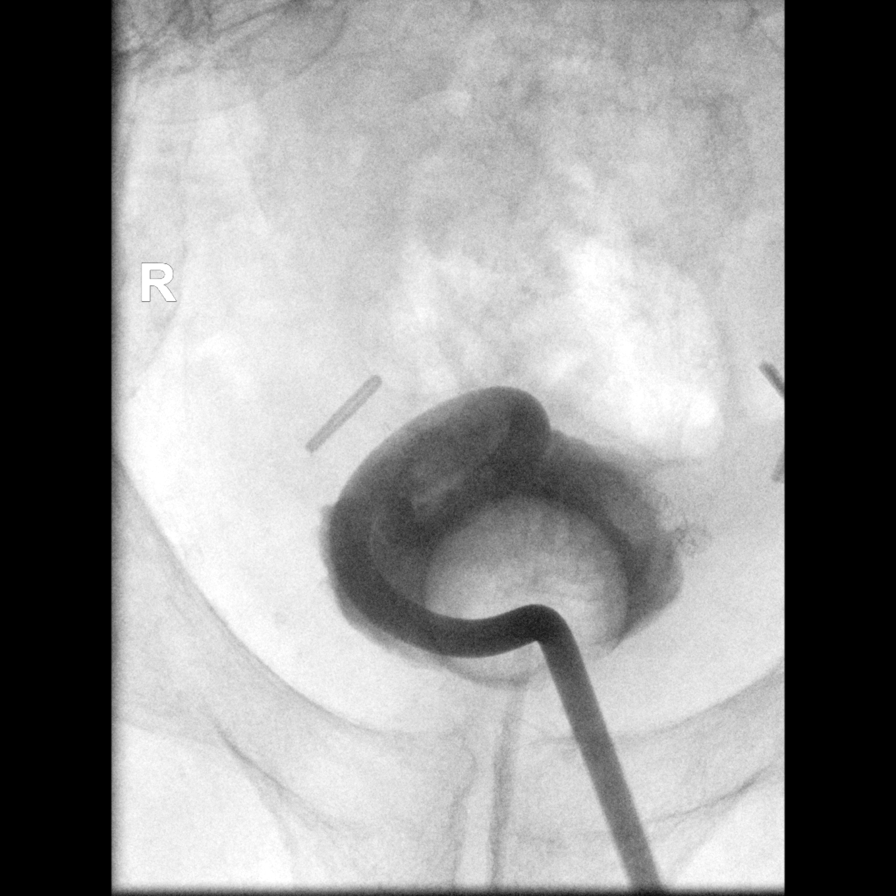

[4 of 4 positions shown; findings below may reference images not displayed]

MEDICATIONS:
None

ANESTHESIA/SEDATION:
Fentanyl 75 mcg IV; Versed 2.0 mg IV

Moderate Sedation Time:  16 minutes

The patient was continuously monitored during the procedure by the
interventional radiology nurse under my direct supervision.

CONTRAST:  10 cc-administered into the collecting system(s)

FLUOROSCOPY TIME:  Fluoroscopy Time: 1 minutes 24 second

COMPLICATIONS:
None

PROCEDURE:
Informed written consent was obtained from the patient after a
thorough discussion of the procedural risks, benefits and
alternatives. All questions were addressed. Maximal Sterile Barrier
Technique was utilized including caps, mask, sterile gowns, sterile
gloves, sterile drape, hand hygiene and skin antiseptic. A timeout
was performed prior to the initiation of the procedure.

Patient positioned supine position on the fluoroscopy table. Scout
images acquired.

Contrast injected through the indwelling catheter confirms catheter
within the urinary bladder.

Modified Seldinger technique was then used to exchange the
indwelling 12 French pigtail catheter for a new 16 French pigtail
catheter. Serial dilation of the soft tissues performed with 14 and
16 French dilators.

New pigtail catheter formed within the urinary bladder, confirm
position with contrast injection.

Patient tolerated the procedure well with no blood loss. No
complications
IMPRESSION: Status post exchange of occluded suprapubic pigtail catheter with up
size from 12 French to 16 French.
